# Patient Record
Sex: Female | Born: 1975
Health system: Southern US, Community
[De-identification: ages and names within clinical notes are randomized; demographics above are authoritative.]

## PROBLEM LIST (undated history)

## (undated) DIAGNOSIS — E669 Obesity, unspecified: Secondary | ICD-10-CM

## (undated) DIAGNOSIS — F319 Bipolar disorder, unspecified: Secondary | ICD-10-CM

## (undated) DIAGNOSIS — Z973 Presence of spectacles and contact lenses: Secondary | ICD-10-CM

## (undated) DIAGNOSIS — F329 Major depressive disorder, single episode, unspecified: Secondary | ICD-10-CM

## (undated) DIAGNOSIS — E282 Polycystic ovarian syndrome: Secondary | ICD-10-CM

## (undated) DIAGNOSIS — M25562 Pain in left knee: Secondary | ICD-10-CM

## (undated) DIAGNOSIS — Z9109 Other allergy status, other than to drugs and biological substances: Secondary | ICD-10-CM

## (undated) DIAGNOSIS — M1990 Unspecified osteoarthritis, unspecified site: Secondary | ICD-10-CM

## (undated) DIAGNOSIS — E119 Type 2 diabetes mellitus without complications: Secondary | ICD-10-CM

## (undated) DIAGNOSIS — F32A Depression, unspecified: Secondary | ICD-10-CM

## (undated) DIAGNOSIS — I10 Essential (primary) hypertension: Secondary | ICD-10-CM

## (undated) DIAGNOSIS — F419 Anxiety disorder, unspecified: Secondary | ICD-10-CM

## (undated) HISTORY — PX: ABDOMINAL HYSTERECTOMY: SHX81

## (undated) HISTORY — PX: DILATION AND CURETTAGE OF UTERUS: SHX78

## (undated) HISTORY — DX: Morbid (severe) obesity due to excess calories: E66.01

## (undated) HISTORY — PX: WISDOM TOOTH EXTRACTION: SHX21

---

## 1998-10-10 ENCOUNTER — Emergency Department (HOSPITAL_COMMUNITY): Admission: EM | Admit: 1998-10-10 | Discharge: 1998-10-10 | Payer: Self-pay | Admitting: Emergency Medicine

## 1998-10-27 ENCOUNTER — Emergency Department (HOSPITAL_COMMUNITY): Admission: EM | Admit: 1998-10-27 | Discharge: 1998-10-28 | Payer: Self-pay | Admitting: Emergency Medicine

## 1998-10-28 ENCOUNTER — Encounter: Payer: Self-pay | Admitting: Emergency Medicine

## 2001-11-21 ENCOUNTER — Emergency Department (HOSPITAL_COMMUNITY): Admission: EM | Admit: 2001-11-21 | Discharge: 2001-11-21 | Payer: Self-pay

## 2002-10-11 ENCOUNTER — Emergency Department (HOSPITAL_COMMUNITY): Admission: EM | Admit: 2002-10-11 | Discharge: 2002-10-11 | Payer: Self-pay

## 2002-11-20 ENCOUNTER — Emergency Department (HOSPITAL_COMMUNITY): Admission: EM | Admit: 2002-11-20 | Discharge: 2002-11-20 | Payer: Self-pay | Admitting: Emergency Medicine

## 2004-05-31 ENCOUNTER — Emergency Department (HOSPITAL_COMMUNITY): Admission: EM | Admit: 2004-05-31 | Discharge: 2004-06-01 | Payer: Self-pay | Admitting: Emergency Medicine

## 2004-11-07 ENCOUNTER — Emergency Department (HOSPITAL_COMMUNITY): Admission: EM | Admit: 2004-11-07 | Discharge: 2004-11-07 | Payer: Self-pay | Admitting: Emergency Medicine

## 2005-10-10 ENCOUNTER — Emergency Department (HOSPITAL_COMMUNITY): Admission: EM | Admit: 2005-10-10 | Discharge: 2005-10-10 | Payer: Self-pay | Admitting: Emergency Medicine

## 2005-12-19 ENCOUNTER — Inpatient Hospital Stay (HOSPITAL_COMMUNITY): Admission: AD | Admit: 2005-12-19 | Discharge: 2005-12-19 | Payer: Self-pay | Admitting: Obstetrics and Gynecology

## 2006-07-24 ENCOUNTER — Inpatient Hospital Stay (HOSPITAL_COMMUNITY): Admission: AD | Admit: 2006-07-24 | Discharge: 2006-07-25 | Payer: Self-pay | Admitting: Psychiatry

## 2006-07-24 ENCOUNTER — Ambulatory Visit: Payer: Self-pay | Admitting: Psychiatry

## 2006-08-07 ENCOUNTER — Ambulatory Visit (HOSPITAL_COMMUNITY): Payer: Self-pay | Admitting: Psychiatry

## 2006-09-04 ENCOUNTER — Emergency Department (HOSPITAL_COMMUNITY): Admission: EM | Admit: 2006-09-04 | Discharge: 2006-09-04 | Payer: Self-pay | Admitting: Emergency Medicine

## 2006-11-14 ENCOUNTER — Emergency Department (HOSPITAL_COMMUNITY): Admission: EM | Admit: 2006-11-14 | Discharge: 2006-11-14 | Payer: Self-pay | Admitting: Emergency Medicine

## 2006-11-16 ENCOUNTER — Emergency Department (HOSPITAL_COMMUNITY): Admission: EM | Admit: 2006-11-16 | Discharge: 2006-11-17 | Payer: Self-pay | Admitting: Emergency Medicine

## 2007-02-12 ENCOUNTER — Emergency Department (HOSPITAL_COMMUNITY): Admission: EM | Admit: 2007-02-12 | Discharge: 2007-02-13 | Payer: Self-pay | Admitting: Emergency Medicine

## 2007-02-13 ENCOUNTER — Ambulatory Visit (HOSPITAL_COMMUNITY): Admission: RE | Admit: 2007-02-13 | Discharge: 2007-02-13 | Payer: Self-pay | Admitting: Emergency Medicine

## 2007-05-13 ENCOUNTER — Emergency Department (HOSPITAL_COMMUNITY): Admission: EM | Admit: 2007-05-13 | Discharge: 2007-05-13 | Payer: Self-pay | Admitting: Emergency Medicine

## 2007-09-02 ENCOUNTER — Emergency Department (HOSPITAL_COMMUNITY): Admission: EM | Admit: 2007-09-02 | Discharge: 2007-09-02 | Payer: Self-pay | Admitting: Emergency Medicine

## 2007-11-02 ENCOUNTER — Emergency Department (HOSPITAL_COMMUNITY): Admission: EM | Admit: 2007-11-02 | Discharge: 2007-11-02 | Payer: Self-pay | Admitting: Emergency Medicine

## 2008-02-15 ENCOUNTER — Emergency Department (HOSPITAL_COMMUNITY): Admission: EM | Admit: 2008-02-15 | Discharge: 2008-02-15 | Payer: Self-pay | Admitting: Emergency Medicine

## 2008-02-21 ENCOUNTER — Emergency Department (HOSPITAL_COMMUNITY): Admission: EM | Admit: 2008-02-21 | Discharge: 2008-02-21 | Payer: Self-pay | Admitting: Emergency Medicine

## 2008-04-05 ENCOUNTER — Emergency Department (HOSPITAL_COMMUNITY): Admission: EM | Admit: 2008-04-05 | Discharge: 2008-04-06 | Payer: Self-pay | Admitting: Emergency Medicine

## 2008-05-02 ENCOUNTER — Emergency Department (HOSPITAL_COMMUNITY): Admission: EM | Admit: 2008-05-02 | Discharge: 2008-05-02 | Payer: Self-pay | Admitting: Family Medicine

## 2008-07-11 ENCOUNTER — Emergency Department (HOSPITAL_COMMUNITY): Admission: EM | Admit: 2008-07-11 | Discharge: 2008-07-11 | Payer: Self-pay | Admitting: Emergency Medicine

## 2008-08-02 ENCOUNTER — Inpatient Hospital Stay (HOSPITAL_COMMUNITY): Admission: AD | Admit: 2008-08-02 | Discharge: 2008-08-02 | Payer: Self-pay | Admitting: Gynecology

## 2009-03-22 ENCOUNTER — Emergency Department (HOSPITAL_COMMUNITY): Admission: EM | Admit: 2009-03-22 | Discharge: 2009-03-22 | Payer: Self-pay | Admitting: Emergency Medicine

## 2009-08-06 ENCOUNTER — Emergency Department (HOSPITAL_COMMUNITY): Admission: EM | Admit: 2009-08-06 | Discharge: 2009-08-07 | Payer: Self-pay | Admitting: Emergency Medicine

## 2010-02-18 ENCOUNTER — Emergency Department (HOSPITAL_COMMUNITY): Admission: EM | Admit: 2010-02-18 | Discharge: 2010-02-18 | Payer: Self-pay | Admitting: Family Medicine

## 2010-02-21 ENCOUNTER — Ambulatory Visit (HOSPITAL_BASED_OUTPATIENT_CLINIC_OR_DEPARTMENT_OTHER): Admission: RE | Admit: 2010-02-21 | Discharge: 2010-02-21 | Payer: Self-pay | Admitting: Orthopedic Surgery

## 2010-08-12 ENCOUNTER — Emergency Department (HOSPITAL_COMMUNITY): Admission: EM | Admit: 2010-08-12 | Discharge: 2010-08-12 | Payer: Self-pay | Admitting: Emergency Medicine

## 2010-10-12 ENCOUNTER — Emergency Department (HOSPITAL_COMMUNITY): Admission: EM | Admit: 2010-10-12 | Discharge: 2010-02-19 | Payer: Self-pay | Admitting: Emergency Medicine

## 2010-11-29 ENCOUNTER — Emergency Department (HOSPITAL_COMMUNITY)
Admission: EM | Admit: 2010-11-29 | Discharge: 2010-11-29 | Payer: Self-pay | Source: Home / Self Care | Admitting: Emergency Medicine

## 2011-01-02 ENCOUNTER — Emergency Department (HOSPITAL_COMMUNITY)
Admission: EM | Admit: 2011-01-02 | Discharge: 2011-01-02 | Disposition: A | Discharge status: Home / Self Care | Payer: 59 | Attending: Emergency Medicine | Admitting: Emergency Medicine

## 2011-01-02 DIAGNOSIS — G43909 Migraine, unspecified, not intractable, without status migrainosus: Secondary | ICD-10-CM | POA: Insufficient documentation

## 2011-01-02 DIAGNOSIS — Z79899 Other long term (current) drug therapy: Secondary | ICD-10-CM | POA: Insufficient documentation

## 2011-01-18 LAB — URINALYSIS, ROUTINE W REFLEX MICROSCOPIC
Bilirubin Urine: NEGATIVE
Specific Gravity, Urine: <1.005 — ABNORMAL LOW (ref 1.005–1.030)
pH: 5 (ref 5.0–8.0)

## 2011-01-18 LAB — CBC
HCT: 39.8 % (ref 36.0–46.0)
Hemoglobin: 13.2 g/dL (ref 12.0–15.0)
MCH: 29.1 pg (ref 26.0–34.0)
MCHC: 33.2 g/dL (ref 30.0–36.0)
MCV: 87.6 fL (ref 78.0–100.0)
Platelets: 282 10*3/uL (ref 150–400)
RBC: 4.54 MIL/uL (ref 3.87–5.11)
RDW: 17.1 % — ABNORMAL HIGH (ref 11.5–15.5)
WBC: 8.8 10*3/uL (ref 4.0–10.5)

## 2011-01-18 LAB — DIFFERENTIAL
Basophils Absolute: 0 10*3/uL (ref 0.0–0.1)
Basophils Relative: 0 % (ref 0–1)
Eosinophils Absolute: 0.1 10*3/uL (ref 0.0–0.7)
Eosinophils Relative: 1 % (ref 0–5)
Lymphocytes Relative: 34 % (ref 12–46)
Lymphs Abs: 3 10*3/uL (ref 0.7–4.0)
Monocytes Absolute: 0.8 10*3/uL (ref 0.1–1.0)
Monocytes Relative: 9 % (ref 3–12)
Neutro Abs: 4.9 10*3/uL (ref 1.7–7.7)
Neutrophils Relative %: 56 % (ref 43–77)

## 2011-01-18 LAB — COMPREHENSIVE METABOLIC PANEL WITH GFR
ALT: 16 U/L (ref 0–35)
AST: 17 U/L (ref 0–37)
Albumin: 3.8 g/dL (ref 3.5–5.2)
Alkaline Phosphatase: 71 U/L (ref 39–117)
BUN: 8 mg/dL (ref 6–23)
CO2: 26 meq/L (ref 19–32)
Calcium: 8.9 mg/dL (ref 8.4–10.5)
Chloride: 104 meq/L (ref 96–112)
Creatinine, Ser: 0.6 mg/dL (ref 0.4–1.2)
GFR calc non Af Amer: >60 mL/min
Glucose, Bld: 92 mg/dL (ref 70–99)
Potassium: 4 meq/L (ref 3.5–5.1)
Sodium: 137 meq/L (ref 135–145)
Total Bilirubin: 0.2 mg/dL — ABNORMAL LOW (ref 0.3–1.2)
Total Protein: 7 g/dL (ref 6.0–8.3)

## 2011-01-18 LAB — WET PREP, GENITAL
Trich, Wet Prep: NONE SEEN
Yeast Wet Prep HPF POC: NONE SEEN

## 2011-01-18 LAB — GC/CHLAMYDIA PROBE AMP, GENITAL
Chlamydia, DNA Probe: NEGATIVE
GC Probe Amp, Genital: NEGATIVE

## 2011-01-18 LAB — URINE MICROSCOPIC-ADD ON

## 2011-01-23 LAB — KOH PREP: KOH Prep: NONE SEEN

## 2011-01-23 LAB — WOUND CULTURE: Culture: NO GROWTH

## 2011-01-23 LAB — ANAEROBIC CULTURE: Gram Stain: NONE SEEN

## 2011-02-08 LAB — URINALYSIS, ROUTINE W REFLEX MICROSCOPIC
Bilirubin Urine: NEGATIVE
Glucose, UA: NEGATIVE mg/dL
Protein, ur: NEGATIVE mg/dL
Specific Gravity, Urine: 1.028 (ref 1.005–1.030)
Urobilinogen, UA: 0.2 mg/dL (ref 0.0–1.0)

## 2011-02-08 LAB — URINE MICROSCOPIC-ADD ON

## 2011-02-08 LAB — GC/CHLAMYDIA PROBE AMP, GENITAL
Chlamydia, DNA Probe: NEGATIVE
GC Probe Amp, Genital: NEGATIVE

## 2011-02-08 LAB — DIFFERENTIAL
Basophils Absolute: 0 10*3/uL (ref 0.0–0.1)
Basophils Relative: 0 % (ref 0–1)
Eosinophils Absolute: 0.1 10*3/uL (ref 0.0–0.7)
Monocytes Relative: 7 % (ref 3–12)
Neutro Abs: 7.5 10*3/uL (ref 1.7–7.7)
Neutrophils Relative %: 71 % (ref 43–77)

## 2011-02-08 LAB — CBC
MCHC: 33.5 g/dL (ref 30.0–36.0)
MCV: 89.9 fL (ref 78.0–100.0)
Platelets: 249 10*3/uL (ref 150–400)
RBC: 4.63 MIL/uL (ref 3.87–5.11)

## 2011-02-08 LAB — RPR: RPR Ser Ql: NONREACTIVE

## 2011-02-08 LAB — WET PREP, GENITAL: Trich, Wet Prep: NONE SEEN

## 2011-02-13 LAB — URINALYSIS, ROUTINE W REFLEX MICROSCOPIC
Bilirubin Urine: NEGATIVE
Glucose, UA: NEGATIVE mg/dL
Hgb urine dipstick: NEGATIVE
Ketones, ur: NEGATIVE mg/dL
Protein, ur: NEGATIVE mg/dL
Urobilinogen, UA: 0.2 mg/dL (ref 0.0–1.0)

## 2011-02-13 LAB — COMPREHENSIVE METABOLIC PANEL
AST: 18 U/L (ref 0–37)
Albumin: 3.2 g/dL — ABNORMAL LOW (ref 3.5–5.2)
BUN: 7 mg/dL (ref 6–23)
Calcium: 8.6 mg/dL (ref 8.4–10.5)
Chloride: 107 mEq/L (ref 96–112)
Creatinine, Ser: 0.65 mg/dL (ref 0.4–1.2)
GFR calc Af Amer: >60 mL/min (ref >60)
Total Bilirubin: 0.1 mg/dL — ABNORMAL LOW (ref 0.3–1.2)

## 2011-02-13 LAB — CBC
HCT: 39.6 % (ref 36.0–46.0)
MCHC: 34.8 g/dL (ref 30.0–36.0)
MCV: 90.1 fL (ref 78.0–100.0)
Platelets: 239 10*3/uL (ref 150–400)
RDW: 14.2 % (ref 11.5–15.5)
WBC: 9.4 10*3/uL (ref 4.0–10.5)

## 2011-02-13 LAB — DIFFERENTIAL
Basophils Absolute: 0 10*3/uL (ref 0.0–0.1)
Lymphocytes Relative: 22 % (ref 12–46)
Lymphs Abs: 2 10*3/uL (ref 0.7–4.0)
Monocytes Absolute: 0.8 10*3/uL (ref 0.1–1.0)
Neutro Abs: 6.4 10*3/uL (ref 1.7–7.7)

## 2011-05-25 ENCOUNTER — Emergency Department (HOSPITAL_COMMUNITY)
Admission: EM | Admit: 2011-05-25 | Discharge: 2011-05-25 | Disposition: A | Discharge status: Home / Self Care | Payer: 59 | Attending: Emergency Medicine | Admitting: Emergency Medicine

## 2011-05-25 ENCOUNTER — Encounter: Payer: Self-pay | Admitting: *Deleted

## 2011-05-25 DIAGNOSIS — T6391XA Toxic effect of contact with unspecified venomous animal, accidental (unintentional), initial encounter: Secondary | ICD-10-CM | POA: Insufficient documentation

## 2011-05-25 DIAGNOSIS — T63481A Toxic effect of venom of other arthropod, accidental (unintentional), initial encounter: Secondary | ICD-10-CM

## 2011-05-25 DIAGNOSIS — IMO0001 Reserved for inherently not codable concepts without codable children: Secondary | ICD-10-CM | POA: Insufficient documentation

## 2011-05-25 DIAGNOSIS — T63461A Toxic effect of venom of wasps, accidental (unintentional), initial encounter: Secondary | ICD-10-CM | POA: Insufficient documentation

## 2011-05-25 DIAGNOSIS — F17200 Nicotine dependence, unspecified, uncomplicated: Secondary | ICD-10-CM | POA: Insufficient documentation

## 2011-05-25 MED ORDER — DIPHENHYDRAMINE HCL 50 MG/ML IJ SOLN
25.0000 mg | Freq: Once | INTRAMUSCULAR | Status: AC
  Administered 2011-05-25: 25 mg via INTRAVENOUS
  Filled 2011-05-25: qty 1

## 2011-05-25 MED ORDER — EPINEPHRINE 0.3 MG/0.3ML IJ DEVI: 0.3000 mg | Freq: Once | INTRAMUSCULAR | Status: DC

## 2011-05-25 MED ORDER — HYDROCODONE-ACETAMINOPHEN 7.5-500 MG PO TABS: 1.0000 | ORAL_TABLET | Freq: Four times a day (QID) | ORAL | Status: AC | PRN

## 2011-05-25 MED ORDER — PROMETHAZINE HCL 12.5 MG PO TABS
12.5000 mg | ORAL_TABLET | Freq: Once | ORAL | Status: AC
  Administered 2011-05-25: 12.5 mg via ORAL
  Filled 2011-05-25: qty 1

## 2011-05-25 MED ORDER — METHYLPREDNISOLONE SODIUM SUCC 125 MG IJ SOLR
125.0000 mg | Freq: Once | INTRAMUSCULAR | Status: AC
  Administered 2011-05-25: 125 mg via INTRAVENOUS
  Filled 2011-05-25: qty 2

## 2011-05-25 MED ORDER — EPINEPHRINE HCL 1 MG/ML IJ SOLN
INTRAMUSCULAR | Status: AC
  Filled 2011-05-25: qty 1

## 2011-05-25 MED ORDER — SODIUM CHLORIDE 0.9 % IV SOLN
Freq: Once | INTRAVENOUS | Status: AC
  Administered 2011-05-25: 21:00:00 via INTRAVENOUS

## 2011-05-25 MED ORDER — FAMOTIDINE IN NACL 20-0.9 MG/50ML-% IV SOLN
20.0000 mg | Freq: Once | INTRAVENOUS | Status: AC
  Administered 2011-05-25: 22:00:00 via INTRAVENOUS
  Filled 2011-05-25: qty 50

## 2011-05-25 MED ORDER — EPINEPHRINE HCL 0.1 MG/ML IJ SOLN
0.3000 mg | Freq: Once | INTRAMUSCULAR | Status: AC
  Administered 2011-05-25: 0.3 mg via SUBCUTANEOUS
  Filled 2011-05-25: qty 10

## 2011-05-25 MED ORDER — HYDROCODONE-ACETAMINOPHEN 5-325 MG PO TABS
2.0000 | ORAL_TABLET | Freq: Once | ORAL | Status: AC
  Administered 2011-05-25: 2 via ORAL
  Filled 2011-05-25: qty 2

## 2011-05-25 MED ORDER — EPINEPHRINE HCL 0.1 MG/ML IJ SOLN
0.3000 mg | Freq: Once | INTRAMUSCULAR | Status: DC
  Filled 2011-05-25: qty 10

## 2011-05-25 MED ORDER — HYDROXYZINE PAMOATE 100 MG PO CAPS: 100.0000 mg | ORAL_CAPSULE | Freq: Three times a day (TID) | ORAL | Status: AC | PRN

## 2011-05-25 NOTE — Progress Notes (Signed)
The redness of the bite site had significantly improved. The pt states is continues to pain her. Rx for Lortab given. Speech clear. Lungs clear. Pt ambulatory without problem. No hives.

## 2011-05-25 NOTE — Progress Notes (Signed)
Speech clear. Airway patent. No hives. Lungs clear.

## 2011-05-25 NOTE — ED Provider Notes (Signed)
History     Chief Complaint  Patient presents with  . Insect Bite   HPI Comments: Pt stung by a bee on the right bicep area. She reports previous hx of severe reaction to a yellow jacket sting. She feel as if she is having difficulty breathing at times. No swelling of face. No hives. No LOC. She treated this about 20 min after the sting with benadryl at home.   The history is provided by the patient.    History reviewed. No pertinent past medical history.  Past Surgical History  Procedure Date  . Abdominal hysterectomy     History reviewed. No pertinent family history.  History  Substance Use Topics  . Smoking status: Current Some Day Smoker  . Smokeless tobacco: Not on file  . Alcohol Use: No    OB History    Grav Para Term Preterm Abortions TAB SAB Ect Mult Living                  Review of Systems  Constitutional: Negative for activity change.       All ROS Neg except as noted in HPI  HENT: Negative for nosebleeds and neck pain.   Eyes: Negative for photophobia and discharge.  Respiratory: Negative for cough, shortness of breath and wheezing.        Difficulty breathing.  Cardiovascular: Negative for chest pain and palpitations.  Gastrointestinal: Negative for abdominal pain and blood in stool.  Genitourinary: Negative for dysuria, frequency and hematuria.  Musculoskeletal: Positive for myalgias. Negative for back pain and arthralgias.  Skin: Negative.   Neurological: Negative for dizziness, seizures and speech difficulty.  Psychiatric/Behavioral: Negative for hallucinations and confusion.    Physical Exam  BP 115/57  Pulse 94  Temp(Src) 98.1 F (36.7 C) (Oral)  Resp 20  Ht 5\' 6"  (1.676 m)  Wt 345 lb (156.491 kg)  BMI 55.68 kg/m2  SpO2 97%  Physical Exam  Nursing note and vitals reviewed. Constitutional: She is oriented to person, place, and time. She appears well-developed and well-nourished.  Non-toxic appearance.  HENT:  Head: Normocephalic.    Right Ear: Tympanic membrane normal.  Left Ear: Tympanic membrane normal.  Mouth/Throat: Oropharynx is clear and moist.  Eyes: EOM and lids are normal. Pupils are equal, round, and reactive to light.  Neck: Normal range of motion. Neck supple. Carotid bruit is not present. No tracheal deviation present.  Cardiovascular: Normal rate, regular rhythm, normal heart sounds, intact distal pulses and normal pulses.   Pulmonary/Chest: Breath sounds normal. No stridor. No respiratory distress. She has no wheezes. She has no rales.  Abdominal: Soft. Bowel sounds are normal. There is no tenderness. There is no guarding.  Musculoskeletal: Normal range of motion.       Increase redness of the right bicep area. No evidence of stinger at the bite site.  Lymphadenopathy:       Head (right side): No submandibular adenopathy present.       Head (left side): No submandibular adenopathy present.    She has no cervical adenopathy.  Neurological: She is alert and oriented to person, place, and time. She has normal strength. No cranial nerve deficit or sensory deficit.  Skin: Skin is warm and dry. No rash noted.  Psychiatric: She has a normal mood and affect. Her speech is normal.    ED Course  Procedures  MDM I have reviewed nursing notes, vital signs, and all appropriate lab and imaging results for this patient.  Kathie Dike, [place] 05/25/11 [phone]

## 2011-05-25 NOTE — ED Notes (Signed)
Pt c/o bee sting to right arm; pt states she does not have her epi pen with her

## 2011-05-25 NOTE — ED Provider Notes (Addendum)
History     Chief Complaint  Patient presents with  . Insect Bite   HPI  History reviewed. No pertinent past medical history.  Past Surgical History  Procedure Date  . Abdominal hysterectomy     History reviewed. No pertinent family history.  History  Substance Use Topics  . Smoking status: Current Some Day Smoker  . Smokeless tobacco: Not on file  . Alcohol Use: No    OB History    Grav Para Term Preterm Abortions TAB SAB Ect Mult Living                  Review of Systems  Physical Exam  BP 126/79  Pulse 98  Temp(Src) 97.9 F (36.6 C) (Oral)  Resp 22  Ht 5\' 6"  (1.676 m)  Wt 345 lb (156.491 kg)  BMI 55.68 kg/m2  SpO2 97%  Physical Exam  ED Course  Procedures  MDM  Medical screening examination/treatment/procedure(s) were performed by non-physician practitioner and as supervising physician I was immediately available for consultation/collaboration.  Devoria Albe, MD, FACEP      Ward Givens, MD 05/25/11 9147  Ward Givens, MD 05/25/11 8295  Ward Givens, MD 05/25/11 2258

## 2011-05-26 NOTE — Progress Notes (Signed)
Medical screening examination/treatment/procedure(s) were performed by non-physician practitioner and as supervising physician I was immediately available for consultation/collaboration. Taran Haynesworth, MD, FACEP 

## 2011-07-25 ENCOUNTER — Emergency Department (HOSPITAL_COMMUNITY)
Admission: EM | Admit: 2011-07-25 | Discharge: 2011-07-25 | Disposition: A | Discharge status: Home / Self Care | Payer: 59 | Attending: Emergency Medicine | Admitting: Emergency Medicine

## 2011-07-25 ENCOUNTER — Emergency Department (HOSPITAL_COMMUNITY): Payer: 59

## 2011-07-25 ENCOUNTER — Encounter (HOSPITAL_COMMUNITY): Payer: Self-pay

## 2011-07-25 DIAGNOSIS — J40 Bronchitis, not specified as acute or chronic: Secondary | ICD-10-CM | POA: Insufficient documentation

## 2011-07-25 DIAGNOSIS — F17200 Nicotine dependence, unspecified, uncomplicated: Secondary | ICD-10-CM | POA: Insufficient documentation

## 2011-07-25 HISTORY — DX: Polycystic ovarian syndrome: E28.2

## 2011-07-25 MED ORDER — ALBUTEROL SULFATE HFA 108 (90 BASE) MCG/ACT IN AERS
2.0000 | INHALATION_SPRAY | Freq: Once | RESPIRATORY_TRACT | Status: AC
  Administered 2011-07-25: 2 via RESPIRATORY_TRACT
  Filled 2011-07-25: qty 6.7

## 2011-07-25 MED ORDER — HYDROCODONE-HOMATROPINE 5-1.5 MG/5ML PO SYRP: 5.0000 mL | ORAL_SOLUTION | Freq: Four times a day (QID) | ORAL | Status: AC | PRN

## 2011-07-25 MED ORDER — SODIUM CHLORIDE 0.9 % IN NEBU
INHALATION_SOLUTION | RESPIRATORY_TRACT | Status: AC
  Administered 2011-07-25: 3 mL
  Filled 2011-07-25: qty 3

## 2011-07-25 MED ORDER — AZITHROMYCIN 250 MG PO TABS: 250.0000 mg | ORAL_TABLET | Freq: Every day | ORAL | Status: AC

## 2011-07-25 MED ORDER — ALBUTEROL SULFATE (5 MG/ML) 0.5% IN NEBU
2.5000 mg | INHALATION_SOLUTION | Freq: Once | RESPIRATORY_TRACT | Status: AC
  Administered 2011-07-25: 2.5 mg via RESPIRATORY_TRACT
  Filled 2011-07-25: qty 0.5

## 2011-07-25 NOTE — ED Provider Notes (Signed)
History     CSN: [id number] Arrival date & time: 07/25/2011  2:30 PM   Chief Complaint  Patient presents with  . Cough  . Nasal Congestion  . Emesis  . Shortness of Breath     (Include location/radiation/quality/duration/timing/severity/associated sxs/prior treatment) Patient is a 35 y.o. female presenting with cough, vomiting, and shortness of breath. The history is provided by the patient.  Cough This is a new problem. The current episode started more than 2 days ago. The problem occurs constantly. The problem has been rapidly worsening. The cough is non-productive. There has been no fever. Pertinent negatives include no chest pain and no chills.  Emesis  Associated symptoms include cough. Pertinent negatives include no chills.  Shortness of Breath  Associated symptoms include cough. Pertinent negatives include no chest pain.     Past Medical History  Diagnosis Date  . Endometriosis   . Polycystic ovarian disease      Past Surgical History  Procedure Date  . Abdominal hysterectomy     No family history on file.  History  Substance Use Topics  . Smoking status: Current Some Day Smoker  . Smokeless tobacco: Not on file  . Alcohol Use: No    OB History    Grav Para Term Preterm Abortions TAB SAB Ect Mult Living                  Review of Systems  Constitutional: Negative for chills.  HENT: Negative for neck pain and neck stiffness.   Respiratory: Positive for cough.   Cardiovascular: Negative for chest pain.  Gastrointestinal: Positive for vomiting.  All other systems reviewed and are negative.    Allergies  Codeine  Home Medications   Current Outpatient Rx  Name Route Sig Dispense Refill  . EPINEPHRINE 0.3 MG/0.3ML IJ DEVI Intramuscular Inject 0.3 mLs (0.3 mg total) into the muscle once. 1 Device 0  . LAMOTRIGINE 100 MG PO TABS Oral Take 100 mg by mouth 2 (two) times daily.        Physical Exam    BP 124/89  Pulse 113  Temp(Src) 98.6 F (37  C) (Oral)  Resp 21  SpO2 97%  Physical Exam  Constitutional: She is oriented to person, place, and time. She appears well-developed and well-nourished. No distress.  HENT:  Head: Normocephalic and atraumatic.  Right Ear: External ear normal.  Left Ear: External ear normal.  Mouth/Throat: Oropharynx is clear and moist.  Neck: Normal range of motion. Neck supple.  Cardiovascular: Normal rate and regular rhythm.  Exam reveals no gallop and no friction rub.   No murmur heard. Pulmonary/Chest: Effort normal. No respiratory distress.       Having persistent cough.  Expiratory wheezing to auscultation.  Musculoskeletal: Normal range of motion.  Lymphadenopathy:    She has no cervical adenopathy.  Neurological: She is alert and oriented to person, place, and time.  Skin: Skin is warm and dry. She is not diaphoretic.    ED Course  Procedures  Results for orders placed during the hospital encounter of 08/12/10  CBC      Component Value Range   WBC 8.8  4.0 - 10.5 (K/uL)   RBC 4.54  3.87 - 5.11 (MIL/uL)   Hemoglobin 13.2  12.0 - 15.0 (g/dL)   HCT 13.0  86.5 - 78.4 (%)   MCV 87.6  78.0 - 100.0 (fL)   MCH 29.1  26.0 - 34.0 (pg)   MCHC 33.2  30.0 - 36.0 (g/dL)  RDW 17.1 (*) 11.5 - 15.5 (%)   Platelets 282  150 - 400 (K/uL)  COMPREHENSIVE METABOLIC PANEL      Component Value Range   Sodium 137  135 - 145 (mEq/L)   Potassium 4.0  3.5 - 5.1 (mEq/L)   Chloride 104  96 - 112 (mEq/L)   CO2 26  19 - 32 (mEq/L)   Glucose, Bld 92  70 - 99 (mg/dL)   BUN 8  6 - 23 (mg/dL)   Creatinine, Ser 1.61  0.4 - 1.2 (mg/dL)   Calcium 8.9  8.4 - 09.6 (mg/dL)   Total Protein 7.0  6.0 - 8.3 (g/dL)   Albumin 3.8  3.5 - 5.2 (g/dL)   AST 17  0 - 37 (U/L)   ALT 16  0 - 35 (U/L)   Alkaline Phosphatase 71  39 - 117 (U/L)   Total Bilirubin 0.2 (*) 0.3 - 1.2 (mg/dL)   GFR calc non Af Amer >60  >60 (mL/min)   GFR calc Af Amer    >60 (mL/min)   Value: >60            The eGFR has been calculated     using  the MDRD equation.     This calculation has not been     validated in all clinical     situations.     eGFR's persistently     <60 mL/min signify     possible Chronic Kidney Disease.  DIFFERENTIAL      Component Value Range   Neutrophils Relative 56  43 - 77 (%)   Neutro Abs 4.9  1.7 - 7.7 (K/uL)   Lymphocytes Relative 34  12 - 46 (%)   Lymphs Abs 3.0  0.7 - 4.0 (K/uL)   Monocytes Relative 9  3 - 12 (%)   Monocytes Absolute 0.8  0.1 - 1.0 (K/uL)   Eosinophils Relative 1  0 - 5 (%)   Eosinophils Absolute 0.1  0.0 - 0.7 (K/uL)   Basophils Relative 0  0 - 1 (%)   Basophils Absolute 0.0  0.0 - 0.1 (K/uL)  GC/CHLAMYDIA PROBE AMP, GENITAL      Component Value Range   GC Probe Amp, Genital    NEGATIVE    Value: NEGATIVE     (NOTE)  Testing performed using the BD ProbeTec Qx Chlamydia trachomatis and Neisseria gonorrhea amplified DNA assay.  Performed at:  [facility]               [address]                    [place], [place] [address]               [id number]   Chlamydia, DNA Probe    NEGATIVE    Value: NEGATIVE     (NOTE)  Testing performed using the BD ProbeT[employer] Qx Chlamydia trachomatis and Neisseria gonorrhea amplified DNA assay.  Performed at:  [facility]               [address]                    [place], [place] [address]  [id number]  WET PREP, GENITAL      Component Value Range   Yeast, Wet Prep NONE SEEN  NONE SEEN    Trich, Wet Prep NONE SEEN  NONE SEEN    Clue Cells, Wet Prep FEW (*) NONE SEEN    WBC, Wet Prep HPF POC FEW (*) NONE SEEN   URINALYSIS, ROUTINE W REFLEX MICROSCOPIC      Component Value Range   Color, Urine YELLOW  YELLOW    Appearance CLEAR  CLEAR    Specific Gravity, Urine <1.005 (*) 1.005 - 1.030    pH 5.0  5.0 - 8.0    Glucose, UA NEGATIVE  NEGATIVE (mg/dL)   Hgb urine dipstick MODERATE (*) NEGATIVE    Bilirubin Urine  NEGATIVE  NEGATIVE    Ketones, ur NEGATIVE  NEGATIVE (mg/dL)   Protein, ur NEGATIVE  NEGATIVE (mg/dL)   Urobilinogen, UA 0.2  0.0 - 1.0 (mg/dL)   Nitrite NEGATIVE  NEGATIVE    Leukocytes, UA NEGATIVE  NEGATIVE   URINE MICROSCOPIC-ADD ON      Component Value Range   Squamous Epithelial / LPF FEW (*) RARE    WBC, UA 3-6  <3 (WBC/hpf)   RBC / HPF 3-6  <3 (RBC/hpf)   Bacteria, UA RARE  RARE    No results found.   No diagnosis found.   MDM Significant improvement with neb treatment.         Geoffery Lyons, MD 07/25/11 [phone]

## 2011-07-25 NOTE — ED Notes (Signed)
Cough,sob, nasal congestion, vomiting for 3 days. Progressively getting worse.

## 2011-07-28 ENCOUNTER — Encounter (HOSPITAL_COMMUNITY): Payer: Self-pay

## 2011-07-28 ENCOUNTER — Emergency Department (HOSPITAL_COMMUNITY)
Admission: EM | Admit: 2011-07-28 | Discharge: 2011-07-28 | Disposition: A | Discharge status: Home / Self Care | Payer: 59 | Attending: Emergency Medicine | Admitting: Emergency Medicine

## 2011-07-28 ENCOUNTER — Emergency Department (HOSPITAL_COMMUNITY): Payer: 59

## 2011-07-28 DIAGNOSIS — L089 Local infection of the skin and subcutaneous tissue, unspecified: Secondary | ICD-10-CM

## 2011-07-28 DIAGNOSIS — L988 Other specified disorders of the skin and subcutaneous tissue: Secondary | ICD-10-CM | POA: Insufficient documentation

## 2011-07-28 DIAGNOSIS — J40 Bronchitis, not specified as acute or chronic: Secondary | ICD-10-CM

## 2011-07-28 DIAGNOSIS — F17200 Nicotine dependence, unspecified, uncomplicated: Secondary | ICD-10-CM | POA: Insufficient documentation

## 2011-07-28 DIAGNOSIS — J9801 Acute bronchospasm: Secondary | ICD-10-CM | POA: Insufficient documentation

## 2011-07-28 MED ORDER — IPRATROPIUM BROMIDE 0.02 % IN SOLN
0.5000 mg | Freq: Once | RESPIRATORY_TRACT | Status: AC
  Administered 2011-07-28: 0.5 mg via RESPIRATORY_TRACT
  Filled 2011-07-28: qty 2.5

## 2011-07-28 MED ORDER — ALBUTEROL SULFATE (5 MG/ML) 0.5% IN NEBU
2.5000 mg | INHALATION_SOLUTION | Freq: Once | RESPIRATORY_TRACT | Status: AC
  Administered 2011-07-28: 2.5 mg via RESPIRATORY_TRACT
  Filled 2011-07-28: qty 0.5

## 2011-07-28 MED ORDER — ALBUTEROL SULFATE HFA 108 (90 BASE) MCG/ACT IN AERS: 1.0000 | INHALATION_SPRAY | Freq: Four times a day (QID) | RESPIRATORY_TRACT | Status: DC | PRN

## 2011-07-28 MED ORDER — ONDANSETRON HCL 4 MG/2ML IJ SOLN
4.0000 mg | Freq: Once | INTRAMUSCULAR | Status: AC
  Administered 2011-07-28: 4 mg via INTRAVENOUS
  Filled 2011-07-28: qty 2

## 2011-07-28 MED ORDER — ALBUTEROL SULFATE (5 MG/ML) 0.5% IN NEBU
5.0000 mg | INHALATION_SOLUTION | Freq: Once | RESPIRATORY_TRACT | Status: AC
  Administered 2011-07-28: 5 mg via RESPIRATORY_TRACT
  Filled 2011-07-28: qty 1

## 2011-07-28 MED ORDER — VANCOMYCIN HCL IN DEXTROSE 1-5 GM/200ML-% IV SOLN
1000.0000 mg | Freq: Once | INTRAVENOUS | Status: DC
  Filled 2011-07-28: qty 200

## 2011-07-28 MED ORDER — PREDNISONE 10 MG PO TABS: 20.0000 mg | ORAL_TABLET | Freq: Two times a day (BID) | ORAL | Status: AC

## 2011-07-28 MED ORDER — DEXTROSE 5 % IV SOLN: 1.0000 g | Freq: Once | INTRAVENOUS | Status: DC

## 2011-07-28 MED ORDER — METHYLPREDNISOLONE SODIUM SUCC 125 MG IJ SOLR
125.0000 mg | Freq: Once | INTRAMUSCULAR | Status: AC
  Administered 2011-07-28: 125 mg via INTRAVENOUS
  Filled 2011-07-28: qty 2

## 2011-07-28 MED ORDER — DOXYCYCLINE HYCLATE 100 MG PO CAPS: 100.0000 mg | ORAL_CAPSULE | Freq: Two times a day (BID) | ORAL | Status: AC

## 2011-07-28 NOTE — ED Notes (Signed)
Seen here in the e.d. 4 days ago, started z-pack for bronchitis, now here with increased sob, cough, sores on arms, chest pain with breathing.

## 2011-07-28 NOTE — ED Provider Notes (Signed)
History    Scribed for Amy Lennert, MD, the patient was seen in room APA01/APA01. This chart was scribed by Amy Reese. This patient's care was started at 9:42 PM.     CSN: [id number] Arrival date & time: 07/28/2011  8:33 PM  Chief Complaint  Patient presents with  . Shortness of Breath    HPI  (Consider location/radiation/quality/duration/timing/severity/associated sxs/prior treatment)  Patient is a 35 y.o. female presenting with shortness of breath.  Shortness of Breath  Associated symptoms include chest pain (increased with inspiration), cough and shortness of breath.    Amy Reese is a 35 y.o. female who presents to the Emergency Department complaining of gradual worsening of SOB with associated cough, chest pain with inspiration and persistent productive cough.  Patient seen in ED 4 days ago for similar sx and was told she had Brochitis.  Patient is a smoker but states she has not smoked since diagnoses.  Patient was given an inhaler, cough syrup and Zithromax.  Patient feels that her recent tattoo may be infected.  Pt c/o sudden onset of sores on her left arm.       PAST MEDICAL HISTORY:  Past Medical History  Diagnosis Date  . Endometriosis   . Polycystic ovarian disease     PAST SURGICAL HISTORY:  Past Surgical History  Procedure Date  . Abdominal hysterectomy     FAMILY HISTORY:  No family history on file.   SOCIAL HISTORY: History   Social History  . Marital Status: Married    Spouse Name: N/A    Number of Children: N/A  . Years of Education: N/A   Social History Main Topics  . Smoking status: Current Some Day Smoker  . Smokeless tobacco: None  . Alcohol Use: No  . Drug Use:   . Sexually Active:    Other Topics Concern  . None   Social History Narrative  . None     Review of Systems  Review of Systems  Constitutional: Negative for fatigue.  HENT: Negative for neck pain, sinus pressure and ear discharge.   Eyes: Negative for  discharge.  Respiratory: Positive for cough and shortness of breath.   Cardiovascular: Positive for chest pain (increased with inspiration).  Gastrointestinal: Negative for abdominal pain and diarrhea.  Genitourinary: Negative for frequency and hematuria.  Musculoskeletal: Negative for back pain.  Skin: Positive for rash (left arm).  Neurological: Negative for seizures and headaches.  Hematological: Negative.   Psychiatric/Behavioral: Negative for hallucinations.    Allergies  Review of patient's allergies indicates no known allergies.  Home Medications   Current Outpatient Rx  Name Route Sig Dispense Refill  . AZITHROMYCIN 250 MG PO TABS Oral Take 1 tablet (250 mg total) by mouth daily. Take first 2 tablets together, then 1 every day until finished. 6 tablet 0  . HYDROCODONE-HOMATROPINE 5-1.5 MG/5ML PO SYRP Oral Take 5 mLs by mouth every 6 (six) hours as needed for cough. 120 mL 0  . LAMOTRIGINE 100 MG PO TABS Oral Take 100 mg by mouth 2 (two) times daily.      Marland Kitchen EPINEPHRINE 0.3 MG/0.3ML IJ DEVI Intramuscular Inject 0.3 mLs (0.3 mg total) into the muscle once. 1 Device 0    Physical Exam    BP 134/77  Pulse 103  Temp(Src) 98.9 F (37.2 C) (Oral)  Resp 20  Ht 5\' 6"  (1.676 m)  Wt 340 lb (154.223 kg)  BMI 54.88 kg/m2  SpO2 97%  Physical Exam  Constitutional: She is oriented  to person, place, and time. She appears well-developed.       Patient is obese.    HENT:  Head: Normocephalic and atraumatic.  Eyes: Conjunctivae and EOM are normal. No scleral icterus.  Neck: Neck supple. No thyromegaly present.  Cardiovascular: Normal rate and regular rhythm.  Exam reveals no gallop and no friction rub.   No murmur heard. Pulmonary/Chest: Effort normal. No stridor. She has wheezes. She exhibits no tenderness.       Mild to moderate wheezes in right and left lung   Abdominal: She exhibits no distension. There is no tenderness. There is no rebound.  Musculoskeletal: Normal range of  motion. She exhibits no edema.  Lymphadenopathy:    She has no cervical adenopathy.  Neurological: She is alert and oriented to person, place, and time. Coordination normal.  Skin: Rash noted. No erythema.       4-5 macro papule areas on left arm consistent with skin infection   Psychiatric: She has a normal mood and affect. Her behavior is normal.    ED Course  Procedures (including critical care time)  OTHER DATA REVIEWED: Nursing notes, vital signs, and past medical records reviewed.   DIAGNOSTIC STUDIES: Oxygen Saturation is 95% on room air, normal by my interpretation.       LABS / RADIOLOGY:   Dg Chest 2 View  07/25/2011  *RADIOLOGY REPORT*  Clinical Data: Cough, shortness of breath, nasal congestion, vomiting, progressively worse  CHEST - 2 VIEW  Comparison: None  Findings: Borderline enlargement of cardiac silhouette. Mediastinal contours and pulmonary vascularity normal. Peribronchial thickening without definite infiltrate or effusion. Slight crowding of basilar markings and mild basilar hypoinflation. No pneumothorax. No acute osseous findings.  IMPRESSION: Bronchitic changes with decreased lung volumes.  Original Report Authenticated By: Amy Reese, M.D.    No results found.   ED COURSE / COORDINATION OF CARE: 9:59 PM  Physical exam complete.  Will give patient nebulizer treatment and order CXR.    Orders Placed This Encounter  Procedures  . DG Chest 2 View  . Saline lock IV    Pt improved with tx   IMPRESSION: Diagnoses that have been ruled out:  Diagnoses that are still under consideration:  Final diagnoses:     MEDICATIONS GIVEN IN THE E.D. Scheduled Meds:    . albuterol  2.5 mg Nebulization Once  . albuterol  5 mg Nebulization Once  . ipratropium  0.5 mg Nebulization Once  . ipratropium  0.5 mg Nebulization Once  . methylPREDNISolone sodium succinate  125 mg Intravenous Once  . DISCONTD: cefTRIAXone (ROCEPHIN) 1 GM IVPB  1 g Intravenous  Once   Continuous Infusions:    . vancomycin 1,000 mg (07/28/11 2223)      DISCHARGE MEDICATIONS: New Prescriptions   No medications on file    The chart was scribed for me under my direct supervision.  I personally performed the history, physical, and medical decision making and all procedures in the evaluation of this patient.Amy Lennert, MD 07/28/11 [phone]

## 2011-07-28 NOTE — ED Notes (Signed)
Pt states was here 4 days ago, given antibiotic, and an inhaler.  Presents today with worsening symptoms of SOB, cough, chest discomfort when coughing and increasing yellow sputum.  Pt also reports sores on left arm, pt believes these are from a tattoo that had gotten infected.

## 2011-08-02 LAB — POCT I-STAT, CHEM 8
BUN: 15
Calcium, Ion: 1.2
Creatinine, Ser: 0.8
HCT: 40
Hemoglobin: 13.6
Potassium: 4.2
Sodium: 139
TCO2: 28

## 2011-08-02 LAB — WET PREP, GENITAL: Trich, Wet Prep: NONE SEEN

## 2011-08-02 LAB — DIFFERENTIAL
Eosinophils Absolute: 0.1
Eosinophils Relative: 1
Lymphocytes Relative: 23
Lymphs Abs: 2.1
Monocytes Relative: 7
Neutrophils Relative %: 70

## 2011-08-02 LAB — URINALYSIS, ROUTINE W REFLEX MICROSCOPIC
Ketones, ur: NEGATIVE
Leukocytes, UA: NEGATIVE
Nitrite: NEGATIVE
Protein, ur: NEGATIVE
Urobilinogen, UA: 0.2
pH: 6

## 2011-08-02 LAB — GC/CHLAMYDIA PROBE AMP, GENITAL
Chlamydia, DNA Probe: NEGATIVE
GC Probe Amp, Genital: NEGATIVE

## 2011-08-02 LAB — CBC
HCT: 38.6
MCV: 89.2
Platelets: 258
RBC: 4.33
WBC: 9.3

## 2011-08-02 LAB — POCT PREGNANCY, URINE: Operator id: 270651

## 2011-08-06 LAB — WET PREP, GENITAL
Trich, Wet Prep: NONE SEEN
Yeast Wet Prep HPF POC: NONE SEEN

## 2011-08-06 LAB — URINALYSIS, ROUTINE W REFLEX MICROSCOPIC
Bilirubin Urine: NEGATIVE
Ketones, ur: NEGATIVE
Nitrite: NEGATIVE
Urobilinogen, UA: 0.2

## 2011-08-06 LAB — POCT PREGNANCY, URINE: Preg Test, Ur: NEGATIVE

## 2011-08-06 LAB — GC/CHLAMYDIA PROBE AMP, GENITAL: GC Probe Amp, Genital: NEGATIVE

## 2011-08-21 LAB — URINALYSIS, ROUTINE W REFLEX MICROSCOPIC
Glucose, UA: NEGATIVE
Protein, ur: NEGATIVE
Specific Gravity, Urine: 1.024
Urobilinogen, UA: 0.2

## 2011-08-21 LAB — DIFFERENTIAL
Lymphocytes Relative: 20
Lymphs Abs: 2.1
Neutro Abs: 7.8 — ABNORMAL HIGH
Neutrophils Relative %: 72

## 2011-08-21 LAB — BASIC METABOLIC PANEL
BUN: 6
Creatinine, Ser: 0.71
GFR calc Af Amer: >60
GFR calc non Af Amer: >60

## 2011-08-21 LAB — CBC
Platelets: 271
WBC: 10.8 — ABNORMAL HIGH

## 2011-08-21 LAB — PREGNANCY, URINE: Preg Test, Ur: NEGATIVE

## 2011-10-20 ENCOUNTER — Encounter (HOSPITAL_COMMUNITY): Payer: Self-pay | Admitting: *Deleted

## 2011-10-20 ENCOUNTER — Emergency Department (HOSPITAL_COMMUNITY)
Admission: EM | Admit: 2011-10-20 | Discharge: 2011-10-20 | Disposition: A | Discharge status: Home / Self Care | Payer: 59 | Attending: Emergency Medicine | Admitting: Emergency Medicine

## 2011-10-20 DIAGNOSIS — L0291 Cutaneous abscess, unspecified: Secondary | ICD-10-CM | POA: Insufficient documentation

## 2011-10-20 DIAGNOSIS — L0390 Cellulitis, unspecified: Secondary | ICD-10-CM | POA: Insufficient documentation

## 2011-10-20 MED ORDER — HYDROCODONE-ACETAMINOPHEN 5-325 MG PO TABS: 1.0000 | ORAL_TABLET | ORAL | Status: AC | PRN

## 2011-10-20 MED ORDER — DOXYCYCLINE HYCLATE 100 MG PO CAPS: 100.0000 mg | ORAL_CAPSULE | Freq: Two times a day (BID) | ORAL | Status: AC

## 2011-10-20 MED ORDER — HYDROCODONE-ACETAMINOPHEN 5-325 MG PO TABS
1.0000 | ORAL_TABLET | ORAL | Status: AC | PRN
Start: 2011-10-20 — End: 2011-10-30

## 2011-10-20 MED ORDER — HYDROCODONE-ACETAMINOPHEN 5-325 MG PO TABS: 1.0000 | ORAL_TABLET | ORAL | Status: DC | PRN

## 2011-10-20 NOTE — ED Notes (Signed)
Small red abscess under rt ear, resulting in pain in neck for about a week

## 2011-10-20 NOTE — ED Provider Notes (Signed)
History     CSN: [id number] Arrival date & time: 10/20/2011  3:05 PM   First MD Initiated Contact with Patient 10/20/11 1510      Chief Complaint  Patient presents with  . Abscess    (Consider location/radiation/quality/duration/timing/severity/associated sxs/prior treatment) HPI Comments: Patient reports that she developed an abscess approximately one week ago.  She popped it herself a couple of days ago and had some drainage from the area.  She reports that she has never had an abscess before.  Patient is a 35 y.o. female presenting with abscess. The history is provided by the patient.  Abscess  This is a new problem. Episode onset: approximately one week ago. The onset was gradual. The problem has been gradually worsening. The abscess is characterized by redness, painfulness, draining and swelling. Pertinent negatives include no anorexia, no fever, no diarrhea, no vomiting, no rhinorrhea, no sore throat and no cough. She has received no recent medical care.    Past Medical History  Diagnosis Date  . Endometriosis   . Polycystic ovarian disease     Past Surgical History  Procedure Date  . Abdominal hysterectomy     No family history on file.  History  Substance Use Topics  . Smoking status: Current Some Day Smoker  . Smokeless tobacco: Not on file  . Alcohol Use: No    OB History    Grav Para Term Preterm Abortions TAB SAB Ect Mult Living                  Review of Systems  Constitutional: Negative for fever and chills.  HENT: Negative for ear pain, sore throat, rhinorrhea, neck pain, neck stiffness and ear discharge.   Eyes: Negative for visual disturbance.  Respiratory: Negative for cough and chest tightness.   Cardiovascular: Negative for chest pain.  Gastrointestinal: Negative for vomiting, abdominal pain, diarrhea and anorexia.  Skin: Positive for color change.  Neurological: Negative for dizziness, syncope, light-headedness and numbness.    Hematological: Negative for adenopathy.  Psychiatric/Behavioral: Negative for confusion.    Allergies  Review of patient's allergies indicates no known allergies.  Home Medications   Current Outpatient Rx  Name Route Sig Dispense Refill  . EPINEPHRINE 0.3 MG/0.3ML IJ DEVI Intramuscular Inject 0.3 mLs (0.3 mg total) into the muscle once. 1 Device 0  . LAMOTRIGINE 100 MG PO TABS Oral Take 100 mg by mouth 2 (two) times daily.      Marland Kitchen RISPERIDONE 1 MG PO TABS Oral Take 1 mg by mouth at bedtime.      . ALBUTEROL SULFATE HFA 108 (90 BASE) MCG/ACT IN AERS Inhalation Inhale 1-2 puffs into the lungs every 6 (six) hours as needed for wheezing. 1 Inhaler 0  . CLONAZEPAM 0.5 MG PO TABS Oral Take 0.5 mg by mouth 2 (two) times daily as needed. anxiety       BP 132/79  Pulse 100  Temp(Src) 98.4 F (36.9 C) (Oral)  Resp 18  SpO2 98%  Physical Exam  Nursing note and vitals reviewed. Constitutional: She is oriented to person, place, and time. She appears well-developed and well-nourished. No distress.  HENT:  Head: Normocephalic and atraumatic.  Right Ear: Tympanic membrane, external ear and ear canal normal. No drainage.  Left Ear: Tympanic membrane, external ear and ear canal normal. No drainage.  Eyes: EOM are normal.  Neck: Normal range of motion. Neck supple.  Cardiovascular: Normal rate, regular rhythm and normal heart sounds.   Pulmonary/Chest: Effort normal and breath  sounds normal. No respiratory distress.  Lymphadenopathy:    She has no cervical adenopathy.  Neurological: She is alert and oriented to person, place, and time.  Skin: She is not diaphoretic.       Approximately 1.5-2cm erythematous indurated area posterior to the right ear lobe.  Area tender to palpation.  Area comes to a point and is open.  No current drainage.  Area is indurated but is not fluctuant.    Psychiatric: She has a normal mood and affect.    ED Course  Procedures (including critical care time)  Labs  Reviewed - No data to display No results found.   1. Abscess       MDM  No fluctuance.  Area was already drained by patient prior to coming to ED.  Therefore, do not feel that an I&D is indicated at this time.  Will discharge patient home on antibiotic and recommend warm compresses and PCP follow up.        Pascal Lux [facility] 10/21/11 1610

## 2011-10-21 NOTE — ED Provider Notes (Signed)
Medical screening examination/treatment/procedure(s) were performed by non-physician practitioner and as supervising physician I was immediately available for consultation/collaboration. Lainey Nelson, MD, FACEP   Amariah Kierstead L Taina Landry, MD 10/21/11 1159 

## 2011-11-09 ENCOUNTER — Emergency Department (HOSPITAL_COMMUNITY)
Admission: EM | Admit: 2011-11-09 | Discharge: 2011-11-09 | Disposition: A | Discharge status: Home / Self Care | Payer: 59 | Attending: Emergency Medicine | Admitting: Emergency Medicine

## 2011-11-09 ENCOUNTER — Encounter (HOSPITAL_COMMUNITY): Payer: Self-pay | Admitting: Emergency Medicine

## 2011-11-09 ENCOUNTER — Emergency Department (HOSPITAL_COMMUNITY): Payer: 59

## 2011-11-09 DIAGNOSIS — W108XXA Fall (on) (from) other stairs and steps, initial encounter: Secondary | ICD-10-CM | POA: Insufficient documentation

## 2011-11-09 DIAGNOSIS — S5011XA Contusion of right forearm, initial encounter: Secondary | ICD-10-CM

## 2011-11-09 DIAGNOSIS — M545 Low back pain, unspecified: Secondary | ICD-10-CM | POA: Insufficient documentation

## 2011-11-09 DIAGNOSIS — S59919A Unspecified injury of unspecified forearm, initial encounter: Secondary | ICD-10-CM | POA: Insufficient documentation

## 2011-11-09 DIAGNOSIS — S20229A Contusion of unspecified back wall of thorax, initial encounter: Secondary | ICD-10-CM | POA: Insufficient documentation

## 2011-11-09 DIAGNOSIS — M79609 Pain in unspecified limb: Secondary | ICD-10-CM | POA: Insufficient documentation

## 2011-11-09 DIAGNOSIS — S300XXA Contusion of lower back and pelvis, initial encounter: Secondary | ICD-10-CM

## 2011-11-09 DIAGNOSIS — Z79899 Other long term (current) drug therapy: Secondary | ICD-10-CM | POA: Insufficient documentation

## 2011-11-09 DIAGNOSIS — S5010XA Contusion of unspecified forearm, initial encounter: Secondary | ICD-10-CM | POA: Insufficient documentation

## 2011-11-09 DIAGNOSIS — S59909A Unspecified injury of unspecified elbow, initial encounter: Secondary | ICD-10-CM | POA: Insufficient documentation

## 2011-11-09 DIAGNOSIS — S6990XA Unspecified injury of unspecified wrist, hand and finger(s), initial encounter: Secondary | ICD-10-CM | POA: Insufficient documentation

## 2011-11-09 DIAGNOSIS — IMO0001 Reserved for inherently not codable concepts without codable children: Secondary | ICD-10-CM | POA: Insufficient documentation

## 2011-11-09 DIAGNOSIS — F17200 Nicotine dependence, unspecified, uncomplicated: Secondary | ICD-10-CM | POA: Insufficient documentation

## 2011-11-09 MED ORDER — HYDROCODONE-ACETAMINOPHEN 5-325 MG PO TABS
1.0000 | ORAL_TABLET | Freq: Once | ORAL | Status: AC
  Administered 2011-11-09: 1 via ORAL
  Filled 2011-11-09: qty 1

## 2011-11-09 MED ORDER — HYDROCODONE-ACETAMINOPHEN 5-325 MG PO TABS: 1.0000 | ORAL_TABLET | Freq: Four times a day (QID) | ORAL | Status: AC | PRN

## 2011-11-09 MED ORDER — IBUPROFEN 800 MG PO TABS
800.0000 mg | ORAL_TABLET | Freq: Once | ORAL | Status: AC
  Administered 2011-11-09: 800 mg via ORAL
  Filled 2011-11-09: qty 1

## 2011-11-09 NOTE — ED Provider Notes (Signed)
History     CSN: [id number]  Arrival date & time 11/09/11  1954   First MD Initiated Contact with Patient 11/09/11 2029      Chief Complaint  Patient presents with  . Fall  . Back Pain  . Arm Pain    (Consider location/radiation/quality/duration/timing/severity/associated sxs/prior treatment) HPI Comments: Pt was walking downstairs and slipped striking her lower back and R forearm on the steps.  No head or other trauma.  Patient is a 36 y.o. female presenting with fall, back pain, and arm pain. The history is provided by the patient. No language interpreter was used.  Fall Incident onset: this AM. The fall occurred while standing. She was not ambulatory at the scene. There was no entrapment after the fall. There was no drug use involved in the accident. There was no alcohol use involved in the accident.  Back Pain   Arm Pain    Past Medical History  Diagnosis Date  . Endometriosis   . Polycystic ovarian disease     Past Surgical History  Procedure Date  . Abdominal hysterectomy     No family history on file.  History  Substance Use Topics  . Smoking status: Current Everyday Smoker -- 1.0 packs/day    Types: Cigarettes  . Smokeless tobacco: Not on file  . Alcohol Use: No    OB History    Grav Para Term Preterm Abortions TAB SAB Ect Mult Living                  Review of Systems  Musculoskeletal: Positive for back pain.       Forearm injury  All other systems reviewed and are negative.    Allergies  Review of patient's allergies indicates no known allergies.  Home Medications   Current Outpatient Rx  Name Route Sig Dispense Refill  . ALBUTEROL SULFATE HFA 108 (90 BASE) MCG/ACT IN AERS Inhalation Inhale 1-2 puffs into the lungs every 6 (six) hours as needed for wheezing. 1 Inhaler 0  . CLONAZEPAM 0.5 MG PO TABS Oral Take 0.5 mg by mouth 2 (two) times daily as needed. anxiety     . EPINEPHRINE 0.3 MG/0.3ML IJ DEVI Intramuscular Inject 0.3 mLs (0.3 mg  total) into the muscle once. 1 Device 0  . LAMOTRIGINE 100 MG PO TABS Oral Take 100 mg by mouth 2 (two) times daily.      Marland Kitchen RISPERIDONE 1 MG PO TABS Oral Take 1 mg by mouth at bedtime.        BP 134/90  Pulse 96  Temp(Src) 98.4 F (36.9 C) (Oral)  Resp 20  Ht 5\' 6"  (1.676 m)  Wt 380 lb (172.367 kg)  BMI 61.33 kg/m2  SpO2 97%  Physical Exam  Nursing note and vitals reviewed. Constitutional: She is oriented to person, place, and time. She appears well-developed and well-nourished. No distress.  HENT:  Head: Normocephalic and atraumatic.  Right Ear: External ear normal.  Left Ear: External ear normal.  Nose: Nose normal.  Eyes: EOM are normal. Pupils are equal, round, and reactive to light.  Neck: Normal range of motion. Neck supple.  Cardiovascular: Normal rate, regular rhythm and normal heart sounds.   Pulmonary/Chest: Effort normal and breath sounds normal. No respiratory distress. She has no wheezes. She has no rales. She exhibits no tenderness.  Abdominal: Soft. She exhibits no distension. There is no tenderness.  Musculoskeletal: She exhibits tenderness.       Right shoulder: She exhibits decreased range of motion.  Arms: Neurological: She is alert and oriented to person, place, and time.  Skin: Skin is warm and dry.  Psychiatric: She has a normal mood and affect. Judgment normal.    ED Course  Procedures (including critical care time)  Labs Reviewed - No data to display Dg Lumbar Spine Complete  11/09/2011  *RADIOLOGY REPORT*  Clinical Data: Recent fall, lower back pain.  LUMBAR SPINE - COMPLETE 4+ VIEW  Comparison: 08/12/2010 CT  Findings: The imaged vertebral bodies and inter-vertebral disc spaces are maintained. No displaced acute fracture or dislocation identified.   The para-vertebral and overlying soft tissues are within normal limits.  Maintained sacroiliac joints.  IMPRESSION: No acute osseous abnormality identified.  Original Report Authenticated By: Waneta Martins, M.D.     No diagnosis found.    MDM          Worthy Rancher, PA 11/09/11 2145

## 2011-11-09 NOTE — ED Provider Notes (Signed)
Medical screening examination/treatment/procedure(s) were performed by non-physician practitioner and as supervising physician I was immediately available for consultation/collaboration.  Nicoletta Dress. Colon Branch, MD 11/09/11 [phone]

## 2011-11-09 NOTE — ED Notes (Signed)
Patient states was walking down steps this morning and fell backwards.  States hit her lower back on a step and c/o right arm pain and states she scratched the back of her right leg.

## 2012-01-29 ENCOUNTER — Ambulatory Visit
Admission: RE | Admit: 2012-01-29 | Discharge: 2012-01-29 | Disposition: A | Discharge status: Home / Self Care | Payer: 59 | Source: Ambulatory Visit | Attending: Nephrology | Admitting: Nephrology

## 2012-01-29 ENCOUNTER — Other Ambulatory Visit: Payer: Self-pay | Admitting: Nephrology

## 2012-01-29 DIAGNOSIS — F17200 Nicotine dependence, unspecified, uncomplicated: Secondary | ICD-10-CM

## 2012-02-05 ENCOUNTER — Ambulatory Visit
Admission: RE | Admit: 2012-02-05 | Discharge: 2012-02-05 | Disposition: A | Discharge status: Home / Self Care | Payer: 59 | Source: Ambulatory Visit | Attending: Nephrology | Admitting: Nephrology

## 2012-02-05 ENCOUNTER — Other Ambulatory Visit: Payer: Self-pay | Admitting: Nephrology

## 2012-02-05 DIAGNOSIS — R52 Pain, unspecified: Secondary | ICD-10-CM

## 2012-02-25 ENCOUNTER — Other Ambulatory Visit: Payer: Self-pay | Admitting: Nephrology

## 2012-02-25 DIAGNOSIS — Z803 Family history of malignant neoplasm of breast: Secondary | ICD-10-CM

## 2012-02-25 DIAGNOSIS — Z1231 Encounter for screening mammogram for malignant neoplasm of breast: Secondary | ICD-10-CM

## 2012-02-29 ENCOUNTER — Ambulatory Visit
Admission: RE | Admit: 2012-02-29 | Discharge: 2012-02-29 | Disposition: A | Discharge status: Home / Self Care | Payer: 59 | Source: Ambulatory Visit | Attending: Nephrology | Admitting: Nephrology

## 2012-02-29 DIAGNOSIS — Z1231 Encounter for screening mammogram for malignant neoplasm of breast: Secondary | ICD-10-CM

## 2012-02-29 DIAGNOSIS — Z803 Family history of malignant neoplasm of breast: Secondary | ICD-10-CM

## 2012-10-05 ENCOUNTER — Encounter (HOSPITAL_COMMUNITY): Payer: Self-pay | Admitting: Emergency Medicine

## 2012-10-05 ENCOUNTER — Emergency Department (HOSPITAL_COMMUNITY)
Admission: EM | Admit: 2012-10-05 | Discharge: 2012-10-05 | Disposition: A | Discharge status: Home / Self Care | Payer: 59 | Source: Home / Self Care | Attending: Family Medicine | Admitting: Family Medicine

## 2012-10-05 DIAGNOSIS — H6690 Otitis media, unspecified, unspecified ear: Secondary | ICD-10-CM

## 2012-10-05 DIAGNOSIS — J069 Acute upper respiratory infection, unspecified: Secondary | ICD-10-CM

## 2012-10-05 HISTORY — DX: Bipolar disorder, unspecified: F31.9

## 2012-10-05 HISTORY — DX: Obesity, unspecified: E66.9

## 2012-10-05 MED ORDER — AMOXICILLIN 500 MG PO TABS: 500.0000 mg | ORAL_TABLET | Freq: Two times a day (BID) | ORAL | Status: DC

## 2012-10-05 NOTE — ED Notes (Signed)
Pt c/o right ear pain and not able to hear. Cough with chest congestion and sore throat. Fever comes and goes pt has used tylenol/ibuprofen for relief. Symptoms has been present x 4 days and gradually getting worse.

## 2012-10-05 NOTE — ED Provider Notes (Signed)
History     CSN: [id number]  Arrival date & time 10/05/12  1712   First MD Initiated Contact with Patient 10/05/12 1840      Chief Complaint  Patient presents with  . URI    cough,chest congesstion, right ear pain, can't hear in right ear. sore throat    (Consider location/radiation/quality/duration/timing/severity/associated sxs/prior treatment) HPI Comments: Pt with cold sx for last several days, treating successfully with otc meds.  Woke up this morning with R ear pain that is worsening.  Also c/o can't hear as well out of ear anymore. Mild fever when illness began, none now. Primary concern is ear pain  Patient is a 36 y.o. female presenting with URI. The history is provided by the patient.  URI The primary symptoms include fever, ear pain, sore throat and cough. The current episode started 3 to 5 days ago. This is a new problem. The problem has been gradually improving.  The ear pain began today. Ear pain is a new problem. The ear pain has been gradually worsening since its onset. The right ear is affected. The pain is moderate.    Symptoms associated with the illness include congestion and rhinorrhea. The illness is not associated with sinus pressure.    Past Medical History  Diagnosis Date  . Endometriosis   . Polycystic ovarian disease   . Obesity     Past Surgical History  Procedure Date  . Abdominal hysterectomy     History reviewed. No pertinent family history.  History  Substance Use Topics  . Smoking status: Current Every Day Smoker -- 1.0 packs/day    Types: Cigarettes  . Smokeless tobacco: Not on file  . Alcohol Use: No    OB History    Grav Para Term Preterm Abortions TAB SAB Ect Mult Living                  Review of Systems  Constitutional: Positive for fever.  HENT: Positive for ear pain, congestion, sore throat, rhinorrhea and postnasal drip. Negative for sinus pressure.   Respiratory: Positive for cough.     Allergies  Bee  venom  Home Medications   Current Outpatient Rx  Name  Route  Sig  Dispense  Refill  . IBUPROFEN 200 MG PO TABS   Oral   Take 800 mg by mouth as needed. For pain          . LAMOTRIGINE 100 MG PO TABS   Oral   Take 100 mg by mouth 2 (two) times daily.           . ALBUTEROL SULFATE HFA 108 (90 BASE) MCG/ACT IN AERS   Inhalation   Inhale 1-2 puffs into the lungs every 6 (six) hours as needed for wheezing.   1 Inhaler   0   . AMOXICILLIN 500 MG PO TABS   Oral   Take 1 tablet (500 mg total) by mouth 2 (two) times daily.   20 tablet   0   . CLONAZEPAM 0.5 MG PO TABS   Oral   Take 0.5 mg by mouth 2 (two) times daily as needed. anxiety          . EPINEPHRINE 0.3 MG/0.3ML IJ DEVI   Intramuscular   Inject 0.3 mLs (0.3 mg total) into the muscle once.   1 Device   0   . ADULT MULTIVITAMIN W/MINERALS CH   Oral   Take 1 tablet by mouth daily.           Marland Kitchen  RISPERIDONE 1 MG PO TABS   Oral   Take 1 mg by mouth at bedtime.             BP 140/100  Pulse 102  Temp 98.6 F (37 C) (Oral)  Resp 20  SpO2 96%  Physical Exam  Constitutional: She appears well-developed and well-nourished. No distress.       Morbid obesity  HENT:  Right Ear: External ear and ear canal normal. Tympanic membrane is bulging.  Left Ear: External ear and ear canal normal. A middle ear effusion is present.  Nose: Mucosal edema present. Right sinus exhibits no maxillary sinus tenderness and no frontal sinus tenderness. Left sinus exhibits no maxillary sinus tenderness and no frontal sinus tenderness.  Mouth/Throat: Posterior oropharyngeal erythema present.       R TM with mild bulging, small amount pus color fluid behind TM c/w early OM  Cardiovascular: Normal rate and regular rhythm.   Pulmonary/Chest: Effort normal and breath sounds normal.    ED Course  Procedures (including critical care time)  Labs Reviewed - No data to display No results found.   1. Otitis media   2. URI (upper  respiratory infection)       MDM  Neti pot, rx antibiotics to fill only if sx don't improve with conservative tx.         Cathlyn Parsons, NP 10/05/12 1904

## 2012-10-07 NOTE — ED Provider Notes (Signed)
Medical screening examination/treatment/procedure(s) were performed by non-physician practitioner and as supervising physician I was immediately available for consultation/collaboration.   [facility]; MD   Sharin Grave, MD 10/07/12 [phone]

## 2012-11-02 ENCOUNTER — Encounter (HOSPITAL_COMMUNITY): Payer: Self-pay | Admitting: *Deleted

## 2012-11-02 ENCOUNTER — Emergency Department (HOSPITAL_COMMUNITY): Payer: 59

## 2012-11-02 ENCOUNTER — Emergency Department (HOSPITAL_COMMUNITY)
Admission: EM | Admit: 2012-11-02 | Discharge: 2012-11-02 | Disposition: A | Discharge status: Home / Self Care | Payer: 59 | Attending: Emergency Medicine | Admitting: Emergency Medicine

## 2012-11-02 DIAGNOSIS — J9801 Acute bronchospasm: Secondary | ICD-10-CM | POA: Insufficient documentation

## 2012-11-02 DIAGNOSIS — Z8742 Personal history of other diseases of the female genital tract: Secondary | ICD-10-CM | POA: Insufficient documentation

## 2012-11-02 DIAGNOSIS — E669 Obesity, unspecified: Secondary | ICD-10-CM | POA: Insufficient documentation

## 2012-11-02 DIAGNOSIS — F17200 Nicotine dependence, unspecified, uncomplicated: Secondary | ICD-10-CM | POA: Insufficient documentation

## 2012-11-02 DIAGNOSIS — Z79899 Other long term (current) drug therapy: Secondary | ICD-10-CM | POA: Insufficient documentation

## 2012-11-02 DIAGNOSIS — F319 Bipolar disorder, unspecified: Secondary | ICD-10-CM | POA: Insufficient documentation

## 2012-11-02 MED ORDER — ALBUTEROL SULFATE HFA 108 (90 BASE) MCG/ACT IN AERS
2.0000 | INHALATION_SPRAY | RESPIRATORY_TRACT | Status: DC | PRN
  Administered 2012-11-02: 2 via RESPIRATORY_TRACT
  Filled 2012-11-02 (×2): qty 6.7

## 2012-11-02 MED ORDER — ALBUTEROL SULFATE (5 MG/ML) 0.5% IN NEBU
5.0000 mg | INHALATION_SOLUTION | Freq: Once | RESPIRATORY_TRACT | Status: AC
  Administered 2012-11-02: 5 mg via RESPIRATORY_TRACT
  Filled 2012-11-02: qty 1

## 2012-11-02 NOTE — ED Notes (Signed)
EMS called for SOB. Resp. rate 30bpm when they arrived. Pt in obvious distress upon their arrival. Complained of ha and numbness to fingertips. Pt placed on nonRebreather and [place] intermittently. Pt felt improvement with coaching and o2 from EMS but did want to come in to ED Amy Reese, Amy Reese

## 2012-11-02 NOTE — ED Provider Notes (Signed)
History     CSN: [id number]  Arrival date & time 11/02/12  2006   First MD Initiated Contact with Patient 11/02/12 2040      Chief Complaint  Patient presents with  . Panic Attack    (Consider location/radiation/quality/duration/timing/severity/associated sxs/prior treatment) HPI Pt presents with c/o shortness of breath and tightness of her breathing.  She states she began to have a cough this morning and chest tightness has been associated with this.  Cough nonproductive.  No fever/chills.  No sore throat.  She states that this evening she was walking in the kitchen and sob became worse which made her very anxious- she began to feel lightheaded and tingling in her fingertips.  She felt improved with O2 given by 911.  She is a 1ppd smoker.  Remote hx of wheezing. No chest pain, no leg swelling.   There are no other associated systemic symptoms, there are no other alleviating or modifying factors.   Past Medical History  Diagnosis Date  . Endometriosis   . Polycystic ovarian disease   . Obesity   . Bipolar disorder     Past Surgical History  Procedure Date  . Abdominal hysterectomy     History reviewed. No pertinent family history.  History  Substance Use Topics  . Smoking status: Current Every Day Smoker -- 1.0 packs/day    Types: Cigarettes  . Smokeless tobacco: Not on file  . Alcohol Use: No    OB History    Grav Para Term Preterm Abortions TAB SAB Ect Mult Living                  Review of Systems ROS reviewed and all otherwise negative except for mentioned in HPI  Allergies  Bee venom  Home Medications   Current Outpatient Rx  Name  Route  Sig  Dispense  Refill  . LAMOTRIGINE 100 MG PO TABS   Oral   Take 100 mg by mouth 2 (two) times daily.           . ADULT MULTIVITAMIN W/MINERALS CH   Oral   Take 1 tablet by mouth daily.           . SULINDAC 150 MG PO TABS   Oral   Take 150 mg by mouth daily.         . VENLAFAXINE HCL ER 75 MG PO  CP24   Oral   Take 75 mg by mouth daily.           BP 146/73  Pulse 109  Temp 97.5 F (36.4 C) (Oral)  Resp 26  SpO2 97% Vitals reviewed Physical Exam Physical Examination: General appearance - alert, well appearing, and in no distress Mental status - alert, oriented to person, place, and time Eyes- no scleral icterus, no conjunctival injection Mouth - mucous membranes moist, pharynx normal without lesions Chest - bilateral mild wheezing, BSS, rales or rhonchi, symmetric air entry, no increased respiratory effort Heart - normal rate, regular rhythm, normal S1, S2, no murmurs, rubs, clicks or gallops Abdomen - soft, nontender, nondistended, no masses or organomegaly Extremities - peripheral pulses normal, no pedal edema, no clubbing or cyanosis Skin - normal coloration and turgor, no rashes Psych- normal mood and affect  ED Course  Procedures (including critical care time)  Labs Reviewed - No data to display Dg Chest 2 View  11/02/2012  *RADIOLOGY REPORT*  Clinical Data: Shortness of breath.  CHEST - 2 VIEW  Comparison:  01/29/2012  Findings:  The heart size and mediastinal contours are within normal limits.  Both lungs are clear.  The visualized skeletal structures are unremarkable.  IMPRESSION: No active cardiopulmonary disease.   Original Report Authenticated By: Myles Rosenthal, M.D.      1. Bronchospasm       MDM  Pt presents with c/o shortness of breath and cough which started earlier today and worsened tonight.  Pt has wheezing on exam- mild.  She is a smoker, she also has body aches.  I have a low suspicion for PE, ACS or other acute emergent condition at this time. Wheezing cleared with albuterol, pt given albuterol MDI for bronchospasm/bronchitis.  Discharged with strict return precautions.  Pt agreeable with plan.        Ethelda Chick, MD 11/02/12 2158

## 2012-11-03 ENCOUNTER — Emergency Department (HOSPITAL_COMMUNITY): Payer: 59

## 2012-11-03 ENCOUNTER — Emergency Department (HOSPITAL_COMMUNITY)
Admission: EM | Admit: 2012-11-03 | Discharge: 2012-11-03 | Disposition: A | Discharge status: Home / Self Care | Payer: 59 | Attending: Emergency Medicine | Admitting: Emergency Medicine

## 2012-11-03 ENCOUNTER — Encounter (HOSPITAL_COMMUNITY): Payer: Self-pay

## 2012-11-03 DIAGNOSIS — F17200 Nicotine dependence, unspecified, uncomplicated: Secondary | ICD-10-CM | POA: Insufficient documentation

## 2012-11-03 DIAGNOSIS — R5383 Other fatigue: Secondary | ICD-10-CM | POA: Insufficient documentation

## 2012-11-03 DIAGNOSIS — R112 Nausea with vomiting, unspecified: Secondary | ICD-10-CM | POA: Insufficient documentation

## 2012-11-03 DIAGNOSIS — R51 Headache: Secondary | ICD-10-CM | POA: Insufficient documentation

## 2012-11-03 DIAGNOSIS — R5381 Other malaise: Secondary | ICD-10-CM | POA: Insufficient documentation

## 2012-11-03 DIAGNOSIS — R0602 Shortness of breath: Secondary | ICD-10-CM | POA: Insufficient documentation

## 2012-11-03 DIAGNOSIS — B349 Viral infection, unspecified: Secondary | ICD-10-CM

## 2012-11-03 DIAGNOSIS — Z9889 Other specified postprocedural states: Secondary | ICD-10-CM | POA: Insufficient documentation

## 2012-11-03 DIAGNOSIS — B9789 Other viral agents as the cause of diseases classified elsewhere: Secondary | ICD-10-CM | POA: Insufficient documentation

## 2012-11-03 DIAGNOSIS — F319 Bipolar disorder, unspecified: Secondary | ICD-10-CM | POA: Insufficient documentation

## 2012-11-03 DIAGNOSIS — R062 Wheezing: Secondary | ICD-10-CM | POA: Insufficient documentation

## 2012-11-03 DIAGNOSIS — IMO0001 Reserved for inherently not codable concepts without codable children: Secondary | ICD-10-CM | POA: Insufficient documentation

## 2012-11-03 DIAGNOSIS — E669 Obesity, unspecified: Secondary | ICD-10-CM | POA: Insufficient documentation

## 2012-11-03 DIAGNOSIS — R197 Diarrhea, unspecified: Secondary | ICD-10-CM | POA: Insufficient documentation

## 2012-11-03 DIAGNOSIS — Z8742 Personal history of other diseases of the female genital tract: Secondary | ICD-10-CM | POA: Insufficient documentation

## 2012-11-03 DIAGNOSIS — Z79899 Other long term (current) drug therapy: Secondary | ICD-10-CM | POA: Insufficient documentation

## 2012-11-03 DIAGNOSIS — J40 Bronchitis, not specified as acute or chronic: Secondary | ICD-10-CM | POA: Insufficient documentation

## 2012-11-03 DIAGNOSIS — R509 Fever, unspecified: Secondary | ICD-10-CM | POA: Insufficient documentation

## 2012-11-03 DIAGNOSIS — M549 Dorsalgia, unspecified: Secondary | ICD-10-CM | POA: Insufficient documentation

## 2012-11-03 LAB — URINALYSIS, ROUTINE W REFLEX MICROSCOPIC
Glucose, UA: NEGATIVE mg/dL
Hgb urine dipstick: NEGATIVE
Protein, ur: NEGATIVE mg/dL
Specific Gravity, Urine: 1.02 (ref 1.005–1.030)

## 2012-11-03 LAB — BASIC METABOLIC PANEL WITH GFR
BUN: 7 mg/dL (ref 6–23)
CO2: 29 meq/L (ref 19–32)
Calcium: 9.5 mg/dL (ref 8.4–10.5)
Chloride: 98 meq/L (ref 96–112)
Creatinine, Ser: 0.72 mg/dL (ref 0.50–1.10)
GFR calc Af Amer: >90 mL/min
GFR calc non Af Amer: >90 mL/min
Glucose, Bld: 117 mg/dL — ABNORMAL HIGH (ref 70–99)
Potassium: 4.5 meq/L (ref 3.5–5.1)
Sodium: 136 meq/L (ref 135–145)

## 2012-11-03 LAB — CBC WITH DIFFERENTIAL/PLATELET
Eosinophils Relative: 0 % (ref 0–5)
HCT: 44.9 % (ref 36.0–46.0)
Hemoglobin: 14.6 g/dL (ref 12.0–15.0)
Lymphocytes Relative: 17 % (ref 12–46)
Lymphs Abs: 0.5 10*3/uL — ABNORMAL LOW (ref 0.7–4.0)
MCV: 94.1 fL (ref 78.0–100.0)
Platelets: 188 10*3/uL (ref 150–400)
RBC: 4.77 MIL/uL (ref 3.87–5.11)
WBC: 3 10*3/uL — ABNORMAL LOW (ref 4.0–10.5)

## 2012-11-03 MED ORDER — HYDROMORPHONE HCL PF 1 MG/ML IJ SOLN
1.0000 mg | Freq: Once | INTRAMUSCULAR | Status: AC
  Administered 2012-11-03: 1 mg via INTRAMUSCULAR
  Filled 2012-11-03: qty 1

## 2012-11-03 MED ORDER — ONDANSETRON 8 MG PO TBDP
8.0000 mg | ORAL_TABLET | Freq: Once | ORAL | Status: AC
  Administered 2012-11-03: 8 mg via ORAL
  Filled 2012-11-03: qty 1

## 2012-11-03 MED ORDER — ONDANSETRON 8 MG PO TBDP: 8.0000 mg | ORAL_TABLET | Freq: Three times a day (TID) | ORAL | Status: DC | PRN

## 2012-11-03 MED ORDER — SODIUM CHLORIDE 0.9 % IV BOLUS (SEPSIS): 500.0000 mL | Freq: Once | INTRAVENOUS | Status: DC

## 2012-11-03 MED ORDER — OXYCODONE-ACETAMINOPHEN 5-325 MG PO TABS: 1.0000 | ORAL_TABLET | Freq: Four times a day (QID) | ORAL | Status: DC | PRN

## 2012-11-03 MED ORDER — ONDANSETRON HCL 4 MG/2ML IJ SOLN
4.0000 mg | Freq: Once | INTRAMUSCULAR | Status: DC
  Filled 2012-11-03: qty 2

## 2012-11-03 MED ORDER — MORPHINE SULFATE 4 MG/ML IJ SOLN: 4.0000 mg | Freq: Once | INTRAMUSCULAR | Status: DC

## 2012-11-03 NOTE — ED Notes (Signed)
Pt seen at [facility] yesterday and diagnosed with broncial spasms. Pt notes increased SOB, cough, body aches and headache. State pain with cough and coughing with deep breathing. Pt aax4

## 2012-11-03 NOTE — ED Notes (Signed)
Pt resting comfortably at this time, NAD, states moderate relief of pain.

## 2012-11-03 NOTE — ED Notes (Signed)
Pt with complaints of cough and SOB that started the previous day. Was seen at [facility] the previous day and diagnosed with bronchial spasms.

## 2012-11-03 NOTE — ED Notes (Signed)
IV access not able to be obtained. Two RN's attempted.Night shift aware.

## 2012-11-03 NOTE — ED Provider Notes (Signed)
History     CSN: [id number]  Arrival date & time 11/03/12  1535   First MD Initiated Contact with Patient 11/03/12 1800      Chief Complaint  Patient presents with  . Cough  . Shortness of Breath    (Consider location/radiation/quality/duration/timing/severity/associated sxs/prior treatment) Patient is a 36 y.o. female presenting with cough and shortness of breath. The history is provided by the patient.  Cough This is a new problem. Associated symptoms include headaches and shortness of breath. Pertinent negatives include no chest pain.  Shortness of Breath  Associated symptoms include cough and shortness of breath. Pertinent negatives include no chest pain.   patient has had cough and shortness of breath the last few days. She was seen at [facility] hospital yesterday and was diagnosed with bronchitis. She states that since then the breathing is improved, but she feels worse overall. She states she now has nausea, vomiting and diarrhea. She aches all over she states her head and back hurt a lot from the coughing. She states she's had chills and possibly fevers. No specific chest pain. No abdominal pain. No swelling or legs.  Past Medical History  Diagnosis Date  . Endometriosis   . Polycystic ovarian disease   . Obesity   . Bipolar disorder     Past Surgical History  Procedure Date  . Abdominal hysterectomy     History reviewed. No pertinent family history.  History  Substance Use Topics  . Smoking status: Current Every Day Smoker -- 1.0 packs/day    Types: Cigarettes  . Smokeless tobacco: Not on file  . Alcohol Use: No    OB History    Grav Para Term Preterm Abortions TAB SAB Ect Mult Living                  Review of Systems  Constitutional: Negative for activity change and appetite change.  HENT: Negative for neck stiffness.   Eyes: Negative for pain.  Respiratory: Positive for cough and shortness of breath. Negative for chest tightness.     Cardiovascular: Negative for chest pain and leg swelling.  Gastrointestinal: Positive for nausea, vomiting and diarrhea. Negative for abdominal pain.  Genitourinary: Negative for flank pain.  Musculoskeletal: Positive for back pain.  Skin: Negative for rash.  Neurological: Positive for headaches. Negative for weakness and numbness.  Psychiatric/Behavioral: Negative for behavioral problems.    Allergies  Bee venom  Home Medications   Current Outpatient Rx  Name  Route  Sig  Dispense  Refill  . DEXTROMETHORPHAN POLISTIREX ER 30 MG/5ML PO LQCR   Oral   Take 60 mg by mouth as needed. For cough         . IBUPROFEN 200 MG PO TABS   Oral   Take 800 mg by mouth daily as needed. For pain/fever         . LAMOTRIGINE 100 MG PO TABS   Oral   Take 100 mg by mouth 2 (two) times daily.           . ADULT MULTIVITAMIN W/MINERALS CH   Oral   Take 1 tablet by mouth daily.           Marland Kitchen PSEUDOEPH-DOXYLAMINE-DM-APAP 60-7.04-03-999 MG/30ML PO LIQD   Oral   Take 30 mLs by mouth once as needed. For cough/flu         . SULINDAC 150 MG PO TABS   Oral   Take 150 mg by mouth daily.         Marland Kitchen  VENLAFAXINE HCL ER 75 MG PO CP24   Oral   Take 75 mg by mouth daily.         Marland Kitchen ONDANSETRON 8 MG PO TBDP   Oral   Take 1 tablet (8 mg total) by mouth every 8 (eight) hours as needed for nausea.   20 tablet   0   . OXYCODONE-ACETAMINOPHEN 5-325 MG PO TABS   Oral   Take 1-2 tablets by mouth every 6 (six) hours as needed for pain.   12 tablet   0     BP 155/69  Pulse 110  Temp 98.3 F (36.8 C) (Oral)  Resp 18  Ht 5\' 6"  (1.676 m)  Wt 345 lb (156.491 kg)  BMI 55.68 kg/m2  SpO2 95%  Physical Exam  Nursing note and vitals reviewed. Constitutional: She is oriented to person, place, and time. She appears well-developed and well-nourished.       Patient is morbidly obese  HENT:  Head: Normocephalic and atraumatic.  Eyes: EOM are normal. Pupils are equal, round, and reactive to  light.  Neck: Normal range of motion. Neck supple.  Cardiovascular: Normal rate, regular rhythm and normal heart sounds.   No murmur heard. Pulmonary/Chest: Effort normal. No respiratory distress. She has wheezes. She has no rales.       Few scattered wheezes.  Abdominal: Soft. Bowel sounds are normal. She exhibits no distension. There is no tenderness. There is no rebound and no guarding.  Musculoskeletal: Normal range of motion.  Neurological: She is alert and oriented to person, place, and time. No cranial nerve deficit.  Skin: Skin is warm and dry.  Psychiatric: She has a normal mood and affect. Her speech is normal.    ED Course  Procedures (including critical care time)  Labs Reviewed  CBC WITH DIFFERENTIAL - Abnormal; Notable for the following:    WBC 3.0 (*)     Lymphs Abs 0.5 (*)     Monocytes Relative 17 (*)     All other components within normal limits  BASIC METABOLIC PANEL - Abnormal; Notable for the following:    Glucose, Bld 117 (*)     All other components within normal limits  URINALYSIS, ROUTINE W REFLEX MICROSCOPIC - Abnormal; Notable for the following:    APPearance CLOUDY (*)     pH 8.5 (*)     All other components within normal limits   Dg Chest 2 View  11/03/2012  *RADIOLOGY REPORT*  Clinical Data: History of coughing and shortness of breath. History of smoking.  CHEST - 2 VIEW  Comparison: 11/02/2012  Findings: Cardiac silhouette is at the upper range of normal size. Mediastinal and hilar contours appear stable.  There is obesity. Haziness of the lungs on PA image is felt to be secondary to overlying soft tissues.  No pulmonary edema, pneumonia, or pleural effusion is evident.  No skeletal lesions are seen.  On lateral image there appears to be some central peribronchial thickening.  IMPRESSION:  No consolidation or pleural effusion is evident.  There is central peribronchial thickening.  This may be associated with bronchitis, asthma, and reactive airway  disease.   Original Report Authenticated By: Onalee Hua Call    Dg Chest 2 View  11/02/2012  *RADIOLOGY REPORT*  Clinical Data: Shortness of breath.  CHEST - 2 VIEW  Comparison:  01/29/2012  Findings:  The heart size and mediastinal contours are within normal limits.  Both lungs are clear.  The visualized skeletal structures are unremarkable.  IMPRESSION: No active cardiopulmonary disease.   Original Report Authenticated By: Myles Rosenthal, M.D.      1. Viral infection       MDM  Patient presents with cough nausea vomiting diarrhea and generalized weakness. She is diagnosed with bronchial spasms yesterday it was a long hospital. Patient states she's feeling bad when she left and still feels bad. Lab work is overall reassuring. Chest x-ray was repeated under protocol and does not show pneumonia. We were unable to obtain IV access on her, however she feels better after IM and oral medications. She's tolerated orals will be discharged home. She's given prescription for pain medication and for anti-emetics.        Juliet Rude. Rubin Payor, MD 11/03/12 2034

## 2012-11-03 NOTE — ED Notes (Signed)
Unable to obtain IV access at this time, Dayshift attempted and I also attempted x1 with no success. EDP aware and ordered PO/IM meds

## 2013-01-17 ENCOUNTER — Emergency Department (HOSPITAL_COMMUNITY): Payer: 59

## 2013-01-17 ENCOUNTER — Emergency Department (HOSPITAL_COMMUNITY)
Admission: EM | Admit: 2013-01-17 | Discharge: 2013-01-17 | Disposition: A | Discharge status: Home / Self Care | Payer: 59 | Attending: Emergency Medicine | Admitting: Emergency Medicine

## 2013-01-17 ENCOUNTER — Encounter (HOSPITAL_COMMUNITY): Payer: Self-pay | Admitting: Emergency Medicine

## 2013-01-17 DIAGNOSIS — S0990XA Unspecified injury of head, initial encounter: Secondary | ICD-10-CM | POA: Insufficient documentation

## 2013-01-17 DIAGNOSIS — W1789XA Other fall from one level to another, initial encounter: Secondary | ICD-10-CM | POA: Insufficient documentation

## 2013-01-17 DIAGNOSIS — S335XXA Sprain of ligaments of lumbar spine, initial encounter: Secondary | ICD-10-CM | POA: Insufficient documentation

## 2013-01-17 DIAGNOSIS — Z79899 Other long term (current) drug therapy: Secondary | ICD-10-CM | POA: Insufficient documentation

## 2013-01-17 DIAGNOSIS — Y92009 Unspecified place in unspecified non-institutional (private) residence as the place of occurrence of the external cause: Secondary | ICD-10-CM | POA: Insufficient documentation

## 2013-01-17 DIAGNOSIS — F319 Bipolar disorder, unspecified: Secondary | ICD-10-CM | POA: Insufficient documentation

## 2013-01-17 DIAGNOSIS — S139XXA Sprain of joints and ligaments of unspecified parts of neck, initial encounter: Secondary | ICD-10-CM

## 2013-01-17 DIAGNOSIS — Z8742 Personal history of other diseases of the female genital tract: Secondary | ICD-10-CM | POA: Insufficient documentation

## 2013-01-17 DIAGNOSIS — Y9389 Activity, other specified: Secondary | ICD-10-CM | POA: Insufficient documentation

## 2013-01-17 DIAGNOSIS — F17200 Nicotine dependence, unspecified, uncomplicated: Secondary | ICD-10-CM | POA: Insufficient documentation

## 2013-01-17 MED ORDER — OXYCODONE-ACETAMINOPHEN 5-325 MG PO TABS
1.0000 | ORAL_TABLET | Freq: Once | ORAL | Status: AC
  Administered 2013-01-17: 1 via ORAL
  Filled 2013-01-17: qty 1

## 2013-01-17 MED ORDER — CYCLOBENZAPRINE HCL 10 MG PO TABS
10.0000 mg | ORAL_TABLET | Freq: Once | ORAL | Status: AC
  Administered 2013-01-17: 10 mg via ORAL
  Filled 2013-01-17: qty 1

## 2013-01-17 MED ORDER — CYCLOBENZAPRINE HCL 10 MG PO TABS: 10.0000 mg | ORAL_TABLET | Freq: Three times a day (TID) | ORAL | Status: DC | PRN

## 2013-01-17 MED ORDER — OXYCODONE-ACETAMINOPHEN 5-325 MG PO TABS: 1.0000 | ORAL_TABLET | ORAL | Status: DC | PRN

## 2013-01-17 NOTE — ED Provider Notes (Signed)
History  This chart was scribed for Amy Human, MD by Bennett Scrape, ED Scribe. This patient was seen in room [facility] and the patient's care was started at 3:05 PM.  CSN: [id number]  Arrival date & time 01/17/13  1447   First MD Initiated Contact with Patient 01/17/13 1505      Chief Complaint  Patient presents with  . Fall    Patient is a 37 y.o. female presenting with fall. The history is provided by the patient. No language interpreter was used.  Fall The accident occurred less than 1 hour ago. The fall occurred from window/balcony. She fell from a height of 3 to 5 ft. She landed on grass. There was no blood loss. The point of impact was the neck. The pain is present in the neck. She was ambulatory at the scene. There was no entrapment after the fall. Pertinent negatives include no fever and no vomiting. Treatment on scene includes a c-collar and a backboard.    Gene Colee is a 37 y.o. female brought in by ambulance on a LSB and in a c-collar, who presents to the Emergency Department complaining of a fall off of a 4 foot porch onto her back and head. Pt states that she was trying to get out of the way while her family was putting up the rail around the porch, misjudged the end of the porch and stepped off falling backwards. She denies LOC and states that she ambulated to the EMS stretcher. She c/o associated right arm pain, neck pain and back pain. She reports that she is on Lamictal and Effexor for bipolar disorder and clinoril for arthritis. She is a 1ppd smoker but denies alcohol or illegal drug use.  Past Medical History  Diagnosis Date  . Endometriosis   . Polycystic ovarian disease   . Obesity   . Bipolar disorder     Past Surgical History  Procedure Laterality Date  . Abdominal hysterectomy      History reviewed. No pertinent family history.  History  Substance Use Topics  . Smoking status: Current Every Day Smoker -- 1.00 packs/day    Types:  Cigarettes  . Smokeless tobacco: Not on file  . Alcohol Use: No    No OB history provided.  Review of Systems  Constitutional: Negative for fever and chills.  HENT: Positive for neck pain. Negative for sore throat.   Respiratory: Negative for cough.   Gastrointestinal: Negative for vomiting and diarrhea.  Genitourinary: Negative for dysuria.  Musculoskeletal: Positive for back pain and arthralgias (right ankle pain).  Skin: Negative for rash.  Neurological: Negative for syncope.  All other systems reviewed and are negative.    Allergies  Bee venom  Home Medications   Current Outpatient Rx  Name  Route  Sig  Dispense  Refill  . dextromethorphan (DELSYM) 30 MG/5ML liquid   Oral   Take 60 mg by mouth as needed. For cough         . ibuprofen (ADVIL,MOTRIN) 200 MG tablet   Oral   Take 800 mg by mouth daily as needed. For pain/fever         . lamoTRIgine (LAMICTAL) 100 MG tablet   Oral   Take 100 mg by mouth 2 (two) times daily.           . Multiple Vitamin (MULITIVITAMIN WITH MINERALS) TABS   Oral   Take 1 tablet by mouth daily.           Marland Kitchen  ondansetron (ZOFRAN-ODT) 8 MG disintegrating tablet   Oral   Take 1 tablet (8 mg total) by mouth every 8 (eight) hours as needed for nausea.   20 tablet   0   . oxyCODONE-acetaminophen (PERCOCET/ROXICET) 5-325 MG per tablet   Oral   Take 1-2 tablets by mouth every 6 (six) hours as needed for pain.   12 tablet   0   . Pseudoeph-Doxylamine-DM-APAP (NYQUIL) 60-7.04-03-999 MG/30ML LIQD   Oral   Take 30 mLs by mouth once as needed. For cough/flu         . sulindac (CLINORIL) 150 MG tablet   Oral   Take 150 mg by mouth daily.         Marland Kitchen venlafaxine XR (EFFEXOR-XR) 75 MG 24 hr capsule   Oral   Take 75 mg by mouth daily.           Triage Vitals: BP 148/82  Pulse 82  Temp(Src) 97.6 F (36.4 C) (Oral)  Resp 20  SpO2 100%  Physical Exam  Nursing note and vitals reviewed. Constitutional: She is oriented to  person, place, and time. No distress. Cervical collar and backboard in place.  Morbidly obese  HENT:  Head: Normocephalic and atraumatic.  Eyes: EOM are normal.  Neck: No tracheal deviation present.  Short thick neck, no palpable deformities, very mild tenderness with turning neck passively from side to side   Cardiovascular: Normal rate and regular rhythm.  Exam reveals no gallop and no friction rub.   No murmur heard. Pulmonary/Chest: Effort normal and breath sounds normal. No respiratory distress. She exhibits no tenderness.  Abdominal: Soft. There is no tenderness.  Massively obese   Musculoskeletal: Normal range of motion.  tenderness to palpation of pelvis, tenderness to then lumbar region with palpation of the pelvis, limited back exam due to LSB, no palpable deformities noted, tenderness to the right ankle, no palpable deformities,  neurovascularly intact throughout  Neurological: She is alert and oriented to person, place, and time.  Sensation and strength intact throughout   Skin: Skin is warm and dry.  Psychiatric: She has a normal mood and affect. Her behavior is normal.    ED Course  Procedures (including critical care time)  DIAGNOSTIC STUDIES: Oxygen Saturation is 100% on room air, normal by my interpretation.    COORDINATION OF CARE: 3:14 PM-Discussed treatment plan which includes CT of c-spine and head, DG ankle, L-spine and CXR with pt at bedside and pt agreed to plan.   3:30 PM- Ordered one tablet of 5-325 mg oxycodone and 10 mg Flexeril tablet  Dg Chest 1 View  01/17/2013  *RADIOLOGY REPORT*  Clinical Data: Fall from 4 feet height off porch.  Chest and back pain.  Morbid obesity.  CHEST - 1 VIEW  Comparison:  11/03/2012  Findings: The heart size and mediastinal contours are within normal limits.  Both lungs are clear.  No evidence of pneumothorax, mediastinal widening, tracheal deviation.  No significant change seen compared to prior exam.  IMPRESSION: No active  disease.   Original Report Authenticated By: Myles Rosenthal, M.D.    Dg Lumbar Spine Complete  01/17/2013  *RADIOLOGY REPORT*  Clinical Data: Fall from 4 feet height onto back.  Back pain. Morbid obesity.  LUMBAR SPINE - COMPLETE 4+ VIEW  Comparison: 02/05/2012  Findings: No evidence of acute fracture, spondylolysis, or spondylolisthesis.  Intervertebral disc spaces are maintained.  No evidence of facet arthropathy other significant bone abnormality.  IMPRESSION: Negative lumbar spine radiographs.  Original Report Authenticated By: Myles Rosenthal, M.D.    Dg Ankle Complete Right  01/17/2013  *RADIOLOGY REPORT*  Clinical Data: Fall from 4 feet height.  Ankle injury and pain. Morbid obesity.  RIGHT ANKLE - COMPLETE 3+ VIEW  Comparison: 09/04/2006  Findings: No evidence of fracture or dislocation.  No evidence of ankle joint effusion.  Plantar and dorsal calcaneal spurs noted.  IMPRESSION: No acute findings.   Original Report Authenticated By: Myles Rosenthal, M.D.    Ct Head Wo Contrast  01/17/2013  *RADIOLOGY REPORT*  Clinical Data:  Larey Seat from height of 4 feet with trauma to the head and neck  CT HEAD WITHOUT CONTRAST CT CERVICAL SPINE WITHOUT CONTRAST  Technique:  Multidetector CT imaging of the head and cervical spine was performed following the standard protocol without intravenous contrast.  Multiplanar CT image reconstructions of the cervical spine were also generated.  Comparison:  09/04/2006  CT HEAD  Findings: The brain shows normal appearance without evidence of malformation, atrophy, old or acute infarction, mass lesion, hemorrhage, hydrocephalus or extra-axial collection.  No skull fracture.  No fluid in the sinuses, middle ears or mastoids.  IMPRESSION: Normal head CT  CT CERVICAL SPINE  Findings: Normal alignment.  No visible fracture.  No significant degenerative change.  Detail is poor because of patient size.  IMPRESSION: Poor detail because of patient size.  No acute or traumatic finding.   Original  Report Authenticated By: Paulina Fusi, M.D.    Ct Cervical Spine Wo Contrast  01/17/2013  *RADIOLOGY REPORT*  Clinical Data:  Larey Seat from height of 4 feet with trauma to the head and neck  CT HEAD WITHOUT CONTRAST CT CERVICAL SPINE WITHOUT CONTRAST  Technique:  Multidetector CT imaging of the head and cervical spine was performed following the standard protocol without intravenous contrast.  Multiplanar CT image reconstructions of the cervical spine were also generated.  Comparison:  09/04/2006  CT HEAD  Findings: The brain shows normal appearance without evidence of malformation, atrophy, old or acute infarction, mass lesion, hemorrhage, hydrocephalus or extra-axial collection.  No skull fracture.  No fluid in the sinuses, middle ears or mastoids.  IMPRESSION: Normal head CT  CT CERVICAL SPINE  Findings: Normal alignment.  No visible fracture.  No significant degenerative change.  Detail is poor because of patient size.  IMPRESSION: Poor detail because of patient size.  No acute or traumatic finding.   Original Report Authenticated By: Paulina Fusi, M.D.    5:12 PM CT of head and neck, x-rays of chest and LS spine and right ankle all negative.  Rx Percocet q4h prn pain, Flexeril 10 mg tid for muscle spasm.  1. Fall at home, initial encounter   2. Cervical sprain, initial encounter   3. Lumbar sprain, initial encounter        Carleene Cooper III, MD 01/17/13 [phone]

## 2013-01-17 NOTE — ED Notes (Signed)
Pt reports she fell 25ft off of her front porch. Pt denies LOC. Pt alert and oriented. Pt c/o of neck,back pain. Pt fully immobilized and on LSB. nad noted at this time.

## 2013-02-03 NOTE — ED Provider Notes (Signed)
I received a call from pharmacy that patient is just now presenting her prescription from 3/15 to be filled. Advised to not fill the narcotic, percocet and can have the muscle relaxer if she wants it.   Ward Givens, MD 02/03/13 1218

## 2013-04-17 ENCOUNTER — Encounter (HOSPITAL_COMMUNITY): Payer: Self-pay | Admitting: *Deleted

## 2013-04-17 ENCOUNTER — Emergency Department (HOSPITAL_COMMUNITY)
Admission: EM | Admit: 2013-04-17 | Discharge: 2013-04-17 | Disposition: A | Discharge status: Home / Self Care | Payer: 59 | Attending: Emergency Medicine | Admitting: Emergency Medicine

## 2013-04-17 DIAGNOSIS — Z79899 Other long term (current) drug therapy: Secondary | ICD-10-CM | POA: Insufficient documentation

## 2013-04-17 DIAGNOSIS — E669 Obesity, unspecified: Secondary | ICD-10-CM | POA: Insufficient documentation

## 2013-04-17 DIAGNOSIS — H0016 Chalazion left eye, unspecified eyelid: Secondary | ICD-10-CM

## 2013-04-17 DIAGNOSIS — H53149 Visual discomfort, unspecified: Secondary | ICD-10-CM | POA: Insufficient documentation

## 2013-04-17 DIAGNOSIS — H0019 Chalazion unspecified eye, unspecified eyelid: Secondary | ICD-10-CM | POA: Insufficient documentation

## 2013-04-17 DIAGNOSIS — F319 Bipolar disorder, unspecified: Secondary | ICD-10-CM | POA: Insufficient documentation

## 2013-04-17 DIAGNOSIS — F17200 Nicotine dependence, unspecified, uncomplicated: Secondary | ICD-10-CM | POA: Insufficient documentation

## 2013-04-17 DIAGNOSIS — H538 Other visual disturbances: Secondary | ICD-10-CM | POA: Insufficient documentation

## 2013-04-17 DIAGNOSIS — Z8742 Personal history of other diseases of the female genital tract: Secondary | ICD-10-CM | POA: Insufficient documentation

## 2013-04-17 DIAGNOSIS — H5789 Other specified disorders of eye and adnexa: Secondary | ICD-10-CM | POA: Insufficient documentation

## 2013-04-17 MED ORDER — KETOROLAC TROMETHAMINE 0.5 % OP SOLN
1.0000 [drp] | Freq: Once | OPHTHALMIC | Status: AC
  Administered 2013-04-17: 1 [drp] via OPHTHALMIC
  Filled 2013-04-17: qty 5

## 2013-04-17 MED ORDER — TOBRAMYCIN 0.3 % OP SOLN
1.0000 [drp] | Freq: Once | OPHTHALMIC | Status: AC
  Administered 2013-04-17: 1 [drp] via OPHTHALMIC
  Filled 2013-04-17: qty 5

## 2013-04-17 NOTE — ED Notes (Signed)
Lt eye pain for 3 days, Upper lid swollen and red, with tearing present.  Pt says she has had a d/c for her eye . Has been taking motrin and using warm compresses to  Her  Eye.  Has noticed pain in cheek and  Headache.

## 2013-04-17 NOTE — ED Provider Notes (Signed)
History     CSN: [id number]  Arrival date & time 04/17/13  1320   First MD Initiated Contact with Patient 04/17/13 1334      Chief Complaint  Patient presents with  . Eye Pain    (Consider location/radiation/quality/duration/timing/severity/associated sxs/prior treatment) HPI Comments: Patient c/o pain, redness and swelling to her left upper eyelid for 3 days.  States the symptoms are worsening and now states she has slightly blurred vision to her left eye. She also reports having drainage to her eye.  She denies fever, headaches, dizziness or recent illness.  She has not tried any OTC eye drops.  She does not wear contacts.    Patient is a 37 y.o. female presenting with conjunctivitis. The history is provided by the patient.  Conjunctivitis This is a new problem. The current episode started in the past 7 days. The problem occurs constantly. The problem has been unchanged. Associated symptoms include a visual change. Pertinent negatives include no arthralgias, chest pain, chills, congestion, coughing, fever, headaches, myalgias, nausea, neck pain, numbness, rash, sore throat, swollen glands, urinary symptoms, vertigo or vomiting. Exacerbated by: bright light and blinking. She has tried nothing for the symptoms. The treatment provided no relief.    Past Medical History  Diagnosis Date  . Endometriosis   . Polycystic ovarian disease   . Obesity   . Bipolar disorder     Past Surgical History  Procedure Laterality Date  . Abdominal hysterectomy      No family history on file.  History  Substance Use Topics  . Smoking status: Current Every Day Smoker -- 1.00 packs/day    Types: Cigarettes  . Smokeless tobacco: Not on file  . Alcohol Use: No    OB History   Grav Para Term Preterm Abortions TAB SAB Ect Mult Living                  Review of Systems  Constitutional: Negative for fever and chills.  HENT: Negative for congestion, sore throat, trouble swallowing, neck pain  and dental problem.   Eyes: Positive for photophobia, pain, discharge, redness and visual disturbance.  Respiratory: Negative for cough and chest tightness.   Cardiovascular: Negative for chest pain.  Gastrointestinal: Negative for nausea and vomiting.  Genitourinary: Negative for dysuria.  Musculoskeletal: Negative for myalgias and arthralgias.  Skin: Negative for color change and rash.  Neurological: Negative for dizziness, vertigo, syncope, facial asymmetry, numbness and headaches.  Hematological: Negative for adenopathy.  All other systems reviewed and are negative.    Allergies  Bee venom  Home Medications   Current Outpatient Rx  Name  Route  Sig  Dispense  Refill  . cyclobenzaprine (FLEXERIL) 10 MG tablet   Oral   Take 1 tablet (10 mg total) by mouth 3 (three) times daily as needed for muscle spasms.   15 tablet   0   . ibuprofen (ADVIL,MOTRIN) 200 MG tablet   Oral   Take 800 mg by mouth daily as needed. For pain/fever         . lamoTRIgine (LAMICTAL) 100 MG tablet   Oral   Take 100 mg by mouth 2 (two) times daily.           . Multiple Vitamin (MULITIVITAMIN WITH MINERALS) TABS   Oral   Take 1 tablet by mouth daily.           Marland Kitchen oxyCODONE-acetaminophen (PERCOCET/ROXICET) 5-325 MG per tablet   Oral   Take 1 tablet by mouth every  4 (four) hours as needed for pain.   20 tablet   0   . sulindac (CLINORIL) 150 MG tablet   Oral   Take 150 mg by mouth 2 (two) times daily.          Marland Kitchen venlafaxine XR (EFFEXOR-XR) 75 MG 24 hr capsule   Oral   Take 75 mg by mouth daily.           BP 151/86  Pulse 108  Temp(Src) 97.9 F (36.6 C) (Oral)  Resp 22  Ht 5\' 6"  (1.676 m)  Wt 340 lb (154.223 kg)  BMI 54.9 kg/m2  SpO2 98%  Physical Exam  Nursing note and vitals reviewed. Constitutional: She is oriented to person, place, and time. She appears well-developed and well-nourished. No distress.  HENT:  Head: Normocephalic and atraumatic.  Mouth/Throat:  Oropharynx is clear and moist.  Eyes: EOM are normal. Pupils are equal, round, and reactive to light. No foreign bodies found. Right eye exhibits no chemosis, no discharge, no exudate and no hordeolum. No foreign body present in the right eye. Left eye exhibits no chemosis, no discharge, no exudate and no hordeolum. No foreign body present in the left eye. Right conjunctiva is not injected. Right conjunctiva has no hemorrhage. Left conjunctiva is not injected. Left conjunctiva has no hemorrhage.    Neck: Normal range of motion. Neck supple.  Cardiovascular: Normal rate, regular rhythm, normal heart sounds and intact distal pulses.   No murmur heard. Pulmonary/Chest: Effort normal and breath sounds normal. No respiratory distress.  Musculoskeletal: Normal range of motion.  Lymphadenopathy:    She has no cervical adenopathy.  Neurological: She is alert and oriented to person, place, and time. She exhibits normal muscle tone. Coordination normal.  Skin: Skin is warm and dry.  Psychiatric: She has a normal mood and affect. Her behavior is normal.    ED Course  Procedures (including critical care time)  Labs Reviewed - No data to display No results found.      MDM    Visual acuity : right distance  20/20 (with glasses) ; L Distance: 20/25 (with glasses)   Erythema and edema of the left upper eyelid only.  Patient is otherwise well appearing.  EOM's intact but reproduces pain with lateral gaze.  No periorbital edema or erythema.  No hx of fever or recent illness.   Possible chalazion.  I have advised pt to try warm wet compresses, and  I will dispense Tobrex and ketorolac drops for home use.  She agrees to return here if sx's worsen , otherwise f/u with ophthmal on Monday.     Mert Dietrick L. Trisha Mangle, PA-C 04/18/13 2334

## 2013-04-17 NOTE — ED Notes (Signed)
Right eye pain/redness x 3 days.  States is getting worse and is affecting vision.

## 2013-04-20 ENCOUNTER — Ambulatory Visit: Payer: Self-pay | Admitting: Family Medicine

## 2013-04-26 NOTE — ED Provider Notes (Signed)
Medical screening examination/treatment/procedure(s) were performed by non-physician practitioner and as supervising physician I was immediately available for consultation/collaboration.   Graiden Henes L Hazim Treadway, MD 04/26/13 0255 

## 2013-06-06 ENCOUNTER — Emergency Department (HOSPITAL_COMMUNITY): Payer: 59

## 2013-06-06 ENCOUNTER — Encounter (HOSPITAL_COMMUNITY): Payer: Self-pay | Admitting: Emergency Medicine

## 2013-06-06 ENCOUNTER — Emergency Department (HOSPITAL_COMMUNITY)
Admission: EM | Admit: 2013-06-06 | Discharge: 2013-06-06 | Disposition: A | Discharge status: Home / Self Care | Payer: 59 | Attending: Emergency Medicine | Admitting: Emergency Medicine

## 2013-06-06 DIAGNOSIS — Z79899 Other long term (current) drug therapy: Secondary | ICD-10-CM | POA: Insufficient documentation

## 2013-06-06 DIAGNOSIS — Z90710 Acquired absence of both cervix and uterus: Secondary | ICD-10-CM | POA: Insufficient documentation

## 2013-06-06 DIAGNOSIS — R630 Anorexia: Secondary | ICD-10-CM | POA: Insufficient documentation

## 2013-06-06 DIAGNOSIS — R109 Unspecified abdominal pain: Secondary | ICD-10-CM

## 2013-06-06 DIAGNOSIS — F17200 Nicotine dependence, unspecified, uncomplicated: Secondary | ICD-10-CM | POA: Insufficient documentation

## 2013-06-06 DIAGNOSIS — Z862 Personal history of diseases of the blood and blood-forming organs and certain disorders involving the immune mechanism: Secondary | ICD-10-CM | POA: Insufficient documentation

## 2013-06-06 DIAGNOSIS — E669 Obesity, unspecified: Secondary | ICD-10-CM | POA: Insufficient documentation

## 2013-06-06 DIAGNOSIS — R197 Diarrhea, unspecified: Secondary | ICD-10-CM | POA: Insufficient documentation

## 2013-06-06 DIAGNOSIS — Z8742 Personal history of other diseases of the female genital tract: Secondary | ICD-10-CM | POA: Insufficient documentation

## 2013-06-06 DIAGNOSIS — F319 Bipolar disorder, unspecified: Secondary | ICD-10-CM | POA: Insufficient documentation

## 2013-06-06 DIAGNOSIS — Z8639 Personal history of other endocrine, nutritional and metabolic disease: Secondary | ICD-10-CM | POA: Insufficient documentation

## 2013-06-06 DIAGNOSIS — R1031 Right lower quadrant pain: Secondary | ICD-10-CM | POA: Insufficient documentation

## 2013-06-06 DIAGNOSIS — R110 Nausea: Secondary | ICD-10-CM | POA: Insufficient documentation

## 2013-06-06 LAB — LIPASE, BLOOD: Lipase: 36 U/L (ref 11–59)

## 2013-06-06 LAB — CBC WITH DIFFERENTIAL/PLATELET
Basophils Relative: 0 % (ref 0–1)
Eosinophils Absolute: 0.1 10*3/uL (ref 0.0–0.7)
Eosinophils Relative: 1 % (ref 0–5)
Hemoglobin: 14.6 g/dL (ref 12.0–15.0)
Lymphs Abs: 2.9 10*3/uL (ref 0.7–4.0)
MCH: 31.7 pg (ref 26.0–34.0)
MCHC: 33.5 g/dL (ref 30.0–36.0)
MCV: 94.8 fL (ref 78.0–100.0)
Monocytes Absolute: 0.9 10*3/uL (ref 0.1–1.0)
Monocytes Relative: 9 % (ref 3–12)
Neutrophils Relative %: 60 % (ref 43–77)

## 2013-06-06 LAB — COMPREHENSIVE METABOLIC PANEL
Albumin: 3.6 g/dL (ref 3.5–5.2)
BUN: 11 mg/dL (ref 6–23)
Calcium: 9.3 mg/dL (ref 8.4–10.5)
Creatinine, Ser: 0.75 mg/dL (ref 0.50–1.10)
GFR calc Af Amer: >90 mL/min (ref >90)
Glucose, Bld: 133 mg/dL — ABNORMAL HIGH (ref 70–99)
Total Protein: 7.9 g/dL (ref 6.0–8.3)

## 2013-06-06 LAB — URINALYSIS, ROUTINE W REFLEX MICROSCOPIC
Bilirubin Urine: NEGATIVE
Hgb urine dipstick: NEGATIVE
Ketones, ur: NEGATIVE mg/dL
Nitrite: NEGATIVE
pH: 5.5 (ref 5.0–8.0)

## 2013-06-06 MED ORDER — MORPHINE SULFATE 4 MG/ML IJ SOLN
4.0000 mg | Freq: Once | INTRAMUSCULAR | Status: AC
Start: 2013-06-06 — End: 2013-06-06
  Administered 2013-06-06: 4 mg via INTRAVENOUS
  Filled 2013-06-06: qty 1

## 2013-06-06 MED ORDER — HYDROCODONE-ACETAMINOPHEN 5-325 MG PO TABS: 1.0000 | ORAL_TABLET | ORAL | Status: DC | PRN

## 2013-06-06 MED ORDER — ONDANSETRON HCL 4 MG/2ML IJ SOLN
4.0000 mg | Freq: Once | INTRAMUSCULAR | Status: AC
  Administered 2013-06-06: 4 mg via INTRAVENOUS
  Filled 2013-06-06: qty 2

## 2013-06-06 MED ORDER — IOHEXOL 300 MG/ML  SOLN
100.0000 mL | Freq: Once | INTRAMUSCULAR | Status: AC | PRN
  Administered 2013-06-06: 100 mL via INTRAVENOUS

## 2013-06-06 MED ORDER — IOHEXOL 300 MG/ML  SOLN
50.0000 mL | Freq: Once | INTRAMUSCULAR | Status: AC | PRN
  Administered 2013-06-06: 50 mL via ORAL

## 2013-06-06 MED ORDER — ONDANSETRON 4 MG PO TBDP: ORAL_TABLET | ORAL | Status: DC

## 2013-06-06 MED ORDER — MORPHINE SULFATE 4 MG/ML IJ SOLN
8.0000 mg | Freq: Once | INTRAMUSCULAR | Status: AC
  Administered 2013-06-06: 8 mg via INTRAVENOUS
  Filled 2013-06-06: qty 2

## 2013-06-06 MED ORDER — SODIUM CHLORIDE 0.9 % IV BOLUS (SEPSIS)
1000.0000 mL | Freq: Once | INTRAVENOUS | Status: AC
  Administered 2013-06-06: 1000 mL via INTRAVENOUS

## 2013-06-06 NOTE — ED Notes (Signed)
Pt c/o rlq pain with n/d since yesterday.

## 2013-06-06 NOTE — ED Provider Notes (Signed)
CSN: [id number]     Arrival date & time 06/06/13  1819 History     First MD Initiated Contact with Patient 06/06/13 1828     Chief Complaint  Patient presents with  . Abdominal Pain  . Nausea   (Consider location/radiation/quality/duration/timing/severity/associated sxs/prior Treatment) HPI Pt with RLQ abd pain starting yesterday morning and gradually worsening. + nausea and anorexia. No fever or chills. + several loose stools. No urinary frequency, dysuria, or hematuria. No vaginal bleeding or d/c. Pt has had complete hysterectomy. No other surgeries. Pain is worse with deep breathing, palpation.  Past Medical History  Diagnosis Date  . Endometriosis   . Polycystic ovarian disease   . Obesity   . Bipolar disorder    Past Surgical History  Procedure Laterality Date  . Abdominal hysterectomy     No family history on file. History  Substance Use Topics  . Smoking status: Current Every Day Smoker -- 1.00 packs/day    Types: Cigarettes  . Smokeless tobacco: Not on file  . Alcohol Use: No   OB History   Grav Para Term Preterm Abortions TAB SAB Ect Mult Living                 Review of Systems  Constitutional: Positive for appetite change. Negative for fever and chills.  HENT: Negative for neck pain and neck stiffness.   Respiratory: Negative for cough and shortness of breath.   Cardiovascular: Negative for chest pain, palpitations and leg swelling.  Gastrointestinal: Positive for nausea, abdominal pain and diarrhea. Negative for vomiting, constipation, blood in stool and abdominal distention.  Genitourinary: Negative for dysuria, frequency, hematuria, flank pain, vaginal bleeding, vaginal discharge and pelvic pain.  Musculoskeletal: Negative for myalgias and back pain.  Skin: Negative for rash and wound.  Neurological: Negative for dizziness, weakness, light-headedness, numbness and headaches.  All other systems reviewed and are negative.    Allergies  Bee venom  Home  Medications   Current Outpatient Rx  Name  Route  Sig  Dispense  Refill  . clonazePAM (KLONOPIN) 1 MG tablet   Oral   Take 0.5 mg by mouth as needed for anxiety.         Marland Kitchen ibuprofen (ADVIL,MOTRIN) 200 MG tablet   Oral   Take 800 mg by mouth daily as needed. For pain/fever         . lamoTRIgine (LAMICTAL) 100 MG tablet   Oral   Take 100 mg by mouth 2 (two) times daily.           Marland Kitchen venlafaxine XR (EFFEXOR-XR) 75 MG 24 hr capsule   Oral   Take 75 mg by mouth daily.         Marland Kitchen HYDROcodone-acetaminophen (NORCO) 5-325 MG per tablet   Oral   Take 1 tablet by mouth every 4 (four) hours as needed for pain.   10 tablet   0   . ondansetron (ZOFRAN ODT) 4 MG disintegrating tablet      4mg  ODT q4 hours prn nausea/vomit   8 tablet   0    BP 151/91  Pulse 94  Temp(Src) 98.4 F (36.9 C) (Oral)  Resp 20  Ht 5\' 6"  (1.676 m)  Wt 340 lb (154.223 kg)  BMI 54.9 kg/m2  SpO2 97% Physical Exam  Nursing note and vitals reviewed. Constitutional: She is oriented to person, place, and time. She appears well-developed and well-nourished. No distress.  HENT:  Head: Normocephalic and atraumatic.  Mouth/Throat: Oropharynx is clear and  moist.  Eyes: EOM are normal. Pupils are equal, round, and reactive to light.  Neck: Normal range of motion. Neck supple.  Cardiovascular: Normal rate and regular rhythm.   Pulmonary/Chest: Effort normal and breath sounds normal. No respiratory distress. She has no wheezes. She has no rales. She exhibits no tenderness.  Abdominal: Soft. Bowel sounds are normal. She exhibits no distension and no mass. There is tenderness (focal RLQ TTP. No rovsing sign). There is no rebound and no guarding.  Musculoskeletal: Normal range of motion. She exhibits no edema and no tenderness.  No CVAT  Neurological: She is alert and oriented to person, place, and time.  Skin: Skin is warm and dry. No rash noted. No erythema.  Psychiatric: She has a normal mood and affect. Her  behavior is normal.    ED Course   Procedures (including critical care time)  Labs Reviewed  URINALYSIS, ROUTINE W REFLEX MICROSCOPIC - Abnormal; Notable for the following:    Specific Gravity, Urine >1.030 (*)    All other components within normal limits  COMPREHENSIVE METABOLIC PANEL - Abnormal; Notable for the following:    Glucose, Bld 133 (*)    Total Bilirubin 0.2 (*)    All other components within normal limits  CBC WITH DIFFERENTIAL  LIPASE, BLOOD   Ct Abdomen Pelvis W Contrast  06/06/2013   *RADIOLOGY REPORT*  Clinical Data: Right lower quadrant pain, endometriosis, PCOS, status post hysterectomy  CT ABDOMEN AND PELVIS WITH CONTRAST  Technique:  Multidetector CT imaging of the abdomen and pelvis was performed following the standard protocol during bolus administration of intravenous contrast.  Contrast: 100 ml Omnipaque-300 IV  Comparison: 08/12/2010  Findings: Lung bases are clear.  Moderate hepatic steatosis.  Spleen, pancreas, and right adrenal gland are within normal limits.  2.9 x 2.3 cm left adrenal adenoma.  Gallbladder is unremarkable.  No intrahepatic or extrahepatic ductal dilatation.  Kidneys are within normal limits.  No hydronephrosis.  No evidence of bowel obstruction. Mildly prominent loops of small bowel in the left upper abdomen without focal transition.  Colon is not decompressed.  Normal appendix.  No evidence of abdominal aortic aneurysm.  No abdominopelvic ascites.  No suspicious abdominopelvic lymphadenopathy.  No adnexal masses.  Bladder is within normal limits.  Mild degenerative changes at L5-S1.  IMPRESSION: Normal appendix.  No evidence of bowel obstruction.  Moderate hepatic steatosis.  2.9 cm left adrenal adenoma.  No CT findings to account for the patient's right lower quadrant abdominal pain.   Original Report Authenticated By: Charline Bills, M.D.   1. Abdominal pain     MDM  Pt symptoms are improved. No cause for pt symptoms found. Question acute  gastroenteritis. Pt given return precautions.   Loren Racer, MD 06/06/13 2037

## 2013-08-11 ENCOUNTER — Emergency Department (HOSPITAL_COMMUNITY)
Admission: EM | Admit: 2013-08-11 | Discharge: 2013-08-11 | Disposition: A | Discharge status: Home / Self Care | Payer: 59 | Attending: Emergency Medicine | Admitting: Emergency Medicine

## 2013-08-11 ENCOUNTER — Encounter (HOSPITAL_COMMUNITY): Payer: Self-pay | Admitting: *Deleted

## 2013-08-11 DIAGNOSIS — M79621 Pain in right upper arm: Secondary | ICD-10-CM

## 2013-08-11 DIAGNOSIS — F17200 Nicotine dependence, unspecified, uncomplicated: Secondary | ICD-10-CM | POA: Insufficient documentation

## 2013-08-11 DIAGNOSIS — E669 Obesity, unspecified: Secondary | ICD-10-CM | POA: Insufficient documentation

## 2013-08-11 DIAGNOSIS — Z8742 Personal history of other diseases of the female genital tract: Secondary | ICD-10-CM | POA: Insufficient documentation

## 2013-08-11 DIAGNOSIS — L089 Local infection of the skin and subcutaneous tissue, unspecified: Secondary | ICD-10-CM

## 2013-08-11 DIAGNOSIS — IMO0002 Reserved for concepts with insufficient information to code with codable children: Secondary | ICD-10-CM | POA: Insufficient documentation

## 2013-08-11 DIAGNOSIS — Z79899 Other long term (current) drug therapy: Secondary | ICD-10-CM | POA: Insufficient documentation

## 2013-08-11 DIAGNOSIS — F319 Bipolar disorder, unspecified: Secondary | ICD-10-CM | POA: Insufficient documentation

## 2013-08-11 MED ORDER — SULFAMETHOXAZOLE-TMP DS 800-160 MG PO TABS: 1.0000 | ORAL_TABLET | Freq: Two times a day (BID) | ORAL | Status: DC

## 2013-08-11 MED ORDER — HYDROCODONE-ACETAMINOPHEN 5-325 MG PO TABS: 1.0000 | ORAL_TABLET | ORAL | Status: DC | PRN

## 2013-08-11 NOTE — ED Provider Notes (Signed)
CSN: [id number]     Arrival date & time 08/11/13  1706 History   First MD Initiated Contact with Patient 08/11/13 1736     Chief Complaint  Patient presents with  . ?abcess    (Consider location/radiation/quality/duration/timing/severity/associated sxs/prior Treatment) Patient is a 37 y.o. female presenting with abscess. The history is provided by the patient.  Abscess Location:  Shoulder/arm Shoulder/arm abscess location:  R axilla Size:  2 mm Abscess quality: draining, painful and warmth   Red streaking: no   Duration:  1 month Progression:  Worsening Pain details:    Quality:  Throbbing and shooting   Severity:  Moderate   Timing:  Constant   Progression:  Worsening Chronicity:  New Relieved by:  Nothing Worsened by:  Nothing tried Associated symptoms: no anorexia, no fatigue, no fever, no headaches, no nausea and no vomiting   Risk factors: no hx of MRSA and no prior abscess     Past Medical History  Diagnosis Date  . Endometriosis   . Polycystic ovarian disease   . Obesity   . Bipolar disorder    Past Surgical History  Procedure Laterality Date  . Abdominal hysterectomy     History reviewed. No pertinent family history. History  Substance Use Topics  . Smoking status: Current Every Day Smoker -- 1.00 packs/day    Types: Cigarettes  . Smokeless tobacco: Not on file  . Alcohol Use: No   OB History   Grav Para Term Preterm Abortions TAB SAB Ect Mult Living                 Review of Systems  Constitutional: Negative for fever and fatigue.  HENT: Negative for sore throat and neck pain.   Gastrointestinal: Negative for nausea, vomiting, abdominal pain and anorexia.  Genitourinary: Negative for frequency.  Musculoskeletal:       Rt axilla pain and draining area.  Skin: Positive for wound.  Neurological: Negative for weakness and headaches.  Psychiatric/Behavioral: Nervous/anxious: hx of Bipolar.     Allergies  Bee venom  Home Medications   Current  Outpatient Rx  Name  Route  Sig  Dispense  Refill  . lamoTRIgine (LAMICTAL) 100 MG tablet   Oral   Take 100 mg by mouth 2 (two) times daily.           Marland Kitchen venlafaxine XR (EFFEXOR-XR) 75 MG 24 hr capsule   Oral   Take 75 mg by mouth daily.         Marland Kitchen ibuprofen (ADVIL,MOTRIN) 200 MG tablet   Oral   Take 800 mg by mouth daily as needed. For pain/fever          BP 148/91  Pulse 93  Temp(Src) 98.4 F (36.9 C) (Oral)  Resp 22  Ht 5\' 7"  (1.702 m)  Wt 340 lb (154.223 kg)  BMI 53.24 kg/m2  SpO2 98% Physical Exam  Nursing note and vitals reviewed. Constitutional: She is oriented to person, place, and time. She appears well-developed and well-nourished. No distress.  HENT:  Head: Normocephalic and atraumatic.  Eyes: Conjunctivae and EOM are normal.  Neck: Neck supple.  Cardiovascular: Normal rate.   Pulmonary/Chest: Effort normal.  Musculoskeletal:  Right axilla tender with palpation. Tiny area that is open and draining small amount of bloody fluid. No mass palpated. Pain of right axilla with range of motion of the right arm.   Neurological: She is alert and oriented to person, place, and time. No cranial nerve deficit.  Skin: Skin is  warm and dry.  Psychiatric: She has a normal mood and affect. Her behavior is normal.    ED Course  Procedures  MDM  37 y.o. female with pain and area of drainage from right axilla x 1 month. Will treat with antibiotics and she will apply warm wet compresses and follow up with General Surgery. She will return here as needed. Patient stable for discharge home without any immediate complications.    Medication List    TAKE these medications       HYDROcodone-acetaminophen 5-325 MG per tablet  Commonly known as:  NORCO/VICODIN  Take 1 tablet by mouth every 4 (four) hours as needed.     sulfamethoxazole-trimethoprim 800-160 MG per tablet  Commonly known as:  BACTRIM DS  Take 1 tablet by mouth 2 (two) times daily.      ASK your doctor about  these medications       ibuprofen 200 MG tablet  Commonly known as:  ADVIL,MOTRIN  Take 800 mg by mouth daily as needed. For pain/fever     lamoTRIgine 100 MG tablet  Commonly known as:  LAMICTAL  Take 100 mg by mouth 2 (two) times daily.     venlafaxine XR 75 MG 24 hr capsule  Commonly known as:  EFFEXOR-XR  Take 75 mg by mouth daily.           [facility] Orlene Och, [place] 08/11/13 [phone]

## 2013-08-11 NOTE — Discharge Instructions (Signed)
Apply warm wet compresses to the area. Take the antibiotic and pain medication as directed. Do not take the narcotic if you are driving or at work as it will make you sleepy. Follow up with Dr. Lovell Sheehan.

## 2013-08-11 NOTE — ED Provider Notes (Signed)
Medical screening examination/treatment/procedure(s) were performed by non-physician practitioner and as supervising physician I was immediately available for consultation/collaboration.  Flint Melter, MD 08/11/13 [phone]

## 2013-08-11 NOTE — ED Notes (Signed)
Anterior axilla small hole which is tender and has been draining bloody fluid x 1 month.  Also reports pain w/radiation distally from axilla.

## 2013-08-13 ENCOUNTER — Emergency Department (HOSPITAL_COMMUNITY): Payer: 59

## 2013-08-13 ENCOUNTER — Encounter (HOSPITAL_COMMUNITY): Payer: Self-pay | Admitting: Emergency Medicine

## 2013-08-13 ENCOUNTER — Emergency Department (HOSPITAL_COMMUNITY)
Admission: EM | Admit: 2013-08-13 | Discharge: 2013-08-13 | Disposition: A | Discharge status: Home / Self Care | Payer: 59 | Attending: Emergency Medicine | Admitting: Emergency Medicine

## 2013-08-13 DIAGNOSIS — E669 Obesity, unspecified: Secondary | ICD-10-CM | POA: Insufficient documentation

## 2013-08-13 DIAGNOSIS — R0789 Other chest pain: Secondary | ICD-10-CM

## 2013-08-13 DIAGNOSIS — Z8742 Personal history of other diseases of the female genital tract: Secondary | ICD-10-CM | POA: Insufficient documentation

## 2013-08-13 DIAGNOSIS — S21101D Unspecified open wound of right front wall of thorax without penetration into thoracic cavity, subsequent encounter: Secondary | ICD-10-CM

## 2013-08-13 DIAGNOSIS — R071 Chest pain on breathing: Secondary | ICD-10-CM | POA: Insufficient documentation

## 2013-08-13 DIAGNOSIS — F17200 Nicotine dependence, unspecified, uncomplicated: Secondary | ICD-10-CM | POA: Insufficient documentation

## 2013-08-13 DIAGNOSIS — L02219 Cutaneous abscess of trunk, unspecified: Secondary | ICD-10-CM | POA: Insufficient documentation

## 2013-08-13 DIAGNOSIS — Z79899 Other long term (current) drug therapy: Secondary | ICD-10-CM | POA: Insufficient documentation

## 2013-08-13 DIAGNOSIS — F319 Bipolar disorder, unspecified: Secondary | ICD-10-CM | POA: Insufficient documentation

## 2013-08-13 LAB — BASIC METABOLIC PANEL
BUN: 10 mg/dL (ref 6–23)
Chloride: 99 mEq/L (ref 96–112)
Creatinine, Ser: 0.68 mg/dL (ref 0.50–1.10)
GFR calc Af Amer: >90 mL/min (ref >90)
Glucose, Bld: 154 mg/dL — ABNORMAL HIGH (ref 70–99)

## 2013-08-13 LAB — CBC WITH DIFFERENTIAL/PLATELET
Basophils Relative: 0 % (ref 0–1)
HCT: 43.6 % (ref 36.0–46.0)
Hemoglobin: 14.4 g/dL (ref 12.0–15.0)
Lymphs Abs: 2.6 10*3/uL (ref 0.7–4.0)
MCH: 31 pg (ref 26.0–34.0)
MCHC: 33 g/dL (ref 30.0–36.0)
Monocytes Absolute: 0.9 10*3/uL (ref 0.1–1.0)
Monocytes Relative: 9 % (ref 3–12)
Neutro Abs: 5.4 10*3/uL (ref 1.7–7.7)
RBC: 4.64 MIL/uL (ref 3.87–5.11)

## 2013-08-13 LAB — URINALYSIS, ROUTINE W REFLEX MICROSCOPIC
Bilirubin Urine: NEGATIVE
Glucose, UA: NEGATIVE mg/dL
Hgb urine dipstick: NEGATIVE
Ketones, ur: NEGATIVE mg/dL
Leukocytes, UA: NEGATIVE
Nitrite: NEGATIVE
Protein, ur: NEGATIVE mg/dL
Specific Gravity, Urine: >1.03 — ABNORMAL HIGH (ref 1.005–1.030)
Urobilinogen, UA: 0.2 mg/dL (ref 0.0–1.0)
pH: 5.5 (ref 5.0–8.0)

## 2013-08-13 MED ORDER — OXYCODONE-ACETAMINOPHEN 5-325 MG PO TABS
2.0000 | ORAL_TABLET | Freq: Once | ORAL | Status: AC
  Administered 2013-08-13: 2 via ORAL
  Filled 2013-08-13: qty 2

## 2013-08-13 MED ORDER — IOHEXOL 350 MG/ML SOLN
120.0000 mL | Freq: Once | INTRAVENOUS | Status: AC | PRN
  Administered 2013-08-13: 120 mL via INTRAVENOUS

## 2013-08-13 MED ORDER — HYDROCODONE-ACETAMINOPHEN 5-325 MG PO TABS: ORAL_TABLET | ORAL | Status: DC

## 2013-08-13 MED ORDER — MORPHINE SULFATE 4 MG/ML IJ SOLN
4.0000 mg | INTRAMUSCULAR | Status: DC | PRN
  Administered 2013-08-13: 4 mg via INTRAVENOUS
  Filled 2013-08-13: qty 1

## 2013-08-13 MED ORDER — CEPHALEXIN 500 MG PO CAPS: 500.0000 mg | ORAL_CAPSULE | Freq: Four times a day (QID) | ORAL | Status: DC

## 2013-08-13 NOTE — ED Provider Notes (Signed)
CSN: [id number]     Arrival date & time 08/13/13  1826 History   First MD Initiated Contact with Patient 08/13/13 1843     Chief Complaint  Patient presents with  . Shortness of Breath  . Open Wound    HPI Pt was seen at 1905.  Per pt, c/o gradual onset and persistence of constant "draining wound" to her right chest wall for the past 1 month. Pt was evaluated in the ED for same 2 days ago, rx abx. States she has a f/u appt with the Surgeon Dr. Lovell Sheehan in 5 days. Pt states the area feels "heavy" which makes her feel "short of breath." States wound area is more "sore" to touch since she started taking the abx yesterday. Denies fevers, no rash, no CP/palpitations, no cough, no injury.   Past Medical History  Diagnosis Date  . Endometriosis   . Polycystic ovarian disease   . Obesity   . Bipolar disorder    Past Surgical History  Procedure Laterality Date  . Abdominal hysterectomy      History  Substance Use Topics  . Smoking status: Current Every Day Smoker -- 1.00 packs/day    Types: Cigarettes  . Smokeless tobacco: Not on file  . Alcohol Use: No    Review of Systems ROS: Statement: All systems negative except as marked or noted in the HPI; Constitutional: Negative for fever and chills. ; ; Eyes: Negative for eye pain, redness and discharge. ; ; ENMT: Negative for ear pain, hoarseness, nasal congestion, sinus pressure and sore throat. ; ; Cardiovascular: Negative for chest pain, palpitations, diaphoresis, and peripheral edema. ; ; Respiratory: +SOB. Negative for cough, wheezing and stridor. ; ; Gastrointestinal: Negative for nausea, vomiting, diarrhea, abdominal pain, blood in stool, hematemesis, jaundice and rectal bleeding. . ; ; Genitourinary: Negative for dysuria, flank pain and hematuria. ; ; Musculoskeletal: Negative for back pain and neck pain. Negative for swelling and trauma.; ; Skin: +wound. Negative for pruritus, rash, abrasions, blisters, bruising and skin lesion.; ; Neuro:  Negative for headache, lightheadedness and neck stiffness. Negative for weakness, altered level of consciousness , altered mental status, extremity weakness, paresthesias, involuntary movement, seizure and syncope.     Allergies  Bee venom  Home Medications   Current Outpatient Rx  Name  Route  Sig  Dispense  Refill  . HYDROcodone-acetaminophen (NORCO/VICODIN) 5-325 MG per tablet   Oral   Take 1 tablet by mouth every 4 (four) hours as needed.   15 tablet   0   . ibuprofen (ADVIL,MOTRIN) 200 MG tablet   Oral   Take 800 mg by mouth daily as needed. For pain/fever         . lamoTRIgine (LAMICTAL) 100 MG tablet   Oral   Take 100 mg by mouth 2 (two) times daily.           Marland Kitchen sulfamethoxazole-trimethoprim (BACTRIM DS) 800-160 MG per tablet   Oral   Take 1 tablet by mouth 2 (two) times daily.   20 tablet   0   . venlafaxine XR (EFFEXOR-XR) 75 MG 24 hr capsule   Oral   Take 75 mg by mouth daily.          BP 150/66  Pulse 100  Temp(Src) 98.1 F (36.7 C) (Oral)  Resp 18  Ht 5\' 6"  (1.676 m)  Wt 340 lb (154.223 kg)  BMI 54.9 kg/m2  SpO2 96% Physical Exam 1910: Physical examination:  Nursing notes reviewed; Vital signs and O2  SAT reviewed;  Constitutional: Well developed, Well nourished, Well hydrated, In no acute distress; Head:  Normocephalic, atraumatic; Eyes: EOMI, PERRL, No scleral icterus; ENMT: Mouth and pharynx normal, Mucous membranes moist; Neck: Supple, Full range of motion, No lymphadenopathy; Cardiovascular: Regular rate and rhythm, No murmur, rub, or gallop; Respiratory: Breath sounds clear & equal bilaterally, No rales, rhonchi, wheezes. Speaking full sentences with ease, Normal respiratory effort/excursion; Chest: +right lateral chest wall with approx 2mm diameter open wound without soft tissue crepitus, no ecchymosis, no edema, no surrounding erythema, no streaking; surrounding area is tender to palp with scant purulent discharge. No discharge is able to be  manually expressed. No palp fluctuance. No palp right breast lump. No nipple discharge. No right axillary lymphadenopathy. No deformity. Movement normal; Abdomen: Soft, Nontender, Nondistended, Normal bowel sounds; Genitourinary: No CVA tenderness; Extremities: Pulses normal, No tenderness, No edema, No calf edema or asymmetry.; Neuro: AA&Ox3, Major CN grossly intact.  Speech clear. No gross focal motor or sensory deficits in extremities.; Skin: Color normal, Warm, Dry.   ED Course  Procedures     MDM  MDM Reviewed: previous chart, nursing note and vitals Reviewed previous: labs Interpretation: labs, x-ray, CT scan and ECG      Date: 08/13/2013  Rate: 93  Rhythm: normal sinus rhythm  QRS Axis: normal  Intervals: normal  ST/T Wave abnormalities: normal  Conduction Disutrbances:none  Narrative Interpretation:   Old EKG Reviewed: none available.   Results for orders placed during the hospital encounter of 08/13/13  CBC WITH DIFFERENTIAL      Result Value Range   WBC 9.1  4.0 - 10.5 K/uL   RBC 4.64  3.87 - 5.11 MIL/uL   Hemoglobin 14.4  12.0 - 15.0 g/dL   HCT 16.1  09.6 - 04.5 %   MCV 94.0  78.0 - 100.0 fL   MCH 31.0  26.0 - 34.0 pg   MCHC 33.0  30.0 - 36.0 g/dL   RDW 40.9  81.1 - 91.4 %   Platelets 246  150 - 400 K/uL   Neutrophils Relative % 60  43 - 77 %   Neutro Abs 5.4  1.7 - 7.7 K/uL   Lymphocytes Relative 29  12 - 46 %   Lymphs Abs 2.6  0.7 - 4.0 K/uL   Monocytes Relative 9  3 - 12 %   Monocytes Absolute 0.9  0.1 - 1.0 K/uL   Eosinophils Relative 2  0 - 5 %   Eosinophils Absolute 0.2  0.0 - 0.7 K/uL   Basophils Relative 0  0 - 1 %   Basophils Absolute 0.0  0.0 - 0.1 K/uL  BASIC METABOLIC PANEL      Result Value Range   Sodium 136  135 - 145 mEq/L   Potassium 4.0  3.5 - 5.1 mEq/L   Chloride 99  96 - 112 mEq/L   CO2 28  19 - 32 mEq/L   Glucose, Bld 154 (*) 70 - 99 mg/dL   BUN 10  6 - 23 mg/dL   Creatinine, Ser 7.82  0.50 - 1.10 mg/dL   Calcium 9.5  8.4 -  95.6 mg/dL   GFR calc non Af Amer >90  >90 mL/min   GFR calc Af Amer >90  >90 mL/min  URINALYSIS, ROUTINE W REFLEX MICROSCOPIC      Result Value Range   Color, Urine YELLOW  YELLOW   APPearance CLEAR  CLEAR   Specific Gravity, Urine >1.030 (*) 1.005 - 1.030  pH 5.5  5.0 - 8.0   Glucose, UA NEGATIVE  NEGATIVE mg/dL   Hgb urine dipstick NEGATIVE  NEGATIVE   Bilirubin Urine NEGATIVE  NEGATIVE   Ketones, ur NEGATIVE  NEGATIVE mg/dL   Protein, ur NEGATIVE  NEGATIVE mg/dL   Urobilinogen, UA 0.2  0.0 - 1.0 mg/dL   Nitrite NEGATIVE  NEGATIVE   Leukocytes, UA NEGATIVE  NEGATIVE  LACTIC ACID, PLASMA      Result Value Range   Lactic Acid, Venous 1.3  0.5 - 2.2 mmol/L   Dg Chest 2 View 08/13/2013   CLINICAL DATA:  Short of breath.  EXAM: CHEST  2 VIEW  COMPARISON:  01/17/2013  FINDINGS: The heart size and mediastinal contours are within normal limits. Both lungs are clear. The visualized skeletal structures are unremarkable.  IMPRESSION: No active cardiopulmonary disease.   Electronically Signed   By: Amie Portland M.D.   On: 08/13/2013 20:25   Ct Angio Chest Pe W/cm &/or Wo Cm 08/13/2013   CLINICAL DATA:  Short of breath, draining the right chest wall wound  EXAM: CT ANGIOGRAPHY CHEST WITH CONTRAST  TECHNIQUE: Multidetector CT imaging of the chest was performed using the standard protocol during bolus administration of intravenous contrast. Multiplanar CT image reconstructions including MIPs were obtained to evaluate the vascular anatomy.  CONTRAST:  OMNIPAQUE IOHEXOL 350 MG/ML SOLN  COMPARISON:  Chest x-ray obtained earlier today at 19:54 p.m.; prior CT abdomen/pelvis 06/06/2013  FINDINGS: Mediastinum: Unremarkable CT appearance of the thyroid gland. No suspicious mediastinal or hilar adenopathy. No soft tissue mediastinal mass. The thoracic esophagus is unremarkable.  Heart/Vascular: Very poor opacification of the pulmonary artery within greater density of contrast material in the aorta and  contrast is still entering the superior vena cava. Constellation of findings is consistent with transient physiologic interruption of contrast material. Evaluation of the pulmonary arteries is essentially nondiagnostic beyond the main pulmonary artery. The heart is within normal limits for size. No pericardial effusion. Conventional 3 vessel arch anatomy. No aneurysmal dilatation.  Lungs/Pleura: No pleural effusion. The lungs are well expanded and clear. No significant atelectasis. Isolated bleb is centrally within the right lower lobe. Small subpleural lymph node in the periphery of the right lower lobe.  Bones/Soft Tissues: No acute fracture or aggressive appearing lytic or blastic osseous lesion.  Upper Abdomen: Probable hepatic steatosis. 2.6 cm left adrenal nodule is incompletely evaluated on the present study but was previously identified as an adenoma.  Review of the MIP images confirms the above findings.  IMPRESSION: 1. Essentially nondiagnostic for pulmonary embolus secondary to transient physiologic interruption of the contrast bolus. 2. Negative for pneumonia or other acute cardiopulmonary process. The lungs are clear. 3. Stable left adrenal adenoma. 4. Hepatic steatosis.   Electronically Signed   By: Malachy Moan M.D.   On: 08/13/2013 21:30    2200:  T/C to Rads MD above: states no evidence of soft tissue infection, no abscess, no right axillary lymphadenopathy; poor timing of bolus to r/o PE though lungs appear clear. Doubt PE as cause for SOB, as pt states "the pain makes me SOB," Sats 98% R/A, and pt is low risk [place]. Pt remains afebrile, WBC and lactic acid normal. Will have pt continue to take abx, rx pain meds, encourage General Surgeon f/u.  Dx and testing d/w pt.  Questions answered.  Verb understanding, agreeable to d/c home with outpt f/u.   Laray Anger, DO 08/16/13 1101

## 2013-08-13 NOTE — ED Notes (Signed)
Pt has an abscess to rt breast with appt to see a surgeon on Tuesday.  Today increased pain with sob.  No NVD.  Taking an antibiotic.

## 2013-08-13 NOTE — ED Notes (Signed)
Fully alert, anxious, worried that the abscess on right chest wall is cancer. No resp distress noted.

## 2013-08-13 NOTE — ED Notes (Signed)
Patient has wound to right chest, midway between breast and axilla. C/o heaviness to chest area and shortness of breath. Denies chest pain. Appears to become short of breath when wound is touched/movement. Redness noted to wound with purulent discharge (scant amount). Patient reports no drainage or redness two days ago during [facility], NP assessment. Patient has contacted surgeon as instructed and has appointment Tuesday.

## 2013-10-06 ENCOUNTER — Encounter (HOSPITAL_COMMUNITY): Payer: Self-pay | Admitting: Emergency Medicine

## 2013-10-06 ENCOUNTER — Emergency Department (INDEPENDENT_AMBULATORY_CARE_PROVIDER_SITE_OTHER)
Admission: EM | Admit: 2013-10-06 | Discharge: 2013-10-06 | Disposition: A | Discharge status: Home / Self Care | Payer: 59 | Source: Home / Self Care | Attending: Family Medicine | Admitting: Family Medicine

## 2013-10-06 DIAGNOSIS — J040 Acute laryngitis: Secondary | ICD-10-CM

## 2013-10-06 DIAGNOSIS — K5289 Other specified noninfective gastroenteritis and colitis: Secondary | ICD-10-CM

## 2013-10-06 DIAGNOSIS — K529 Noninfective gastroenteritis and colitis, unspecified: Secondary | ICD-10-CM

## 2013-10-06 NOTE — ED Notes (Signed)
Pt c/o laryngitis onset 2 days Reports she had abd pain, diarrhea, and vomiting about 4 days ago Still having some diarrhea and BA She is alert w/no signs of acute distress.

## 2013-10-06 NOTE — ED Provider Notes (Signed)
CSN: [id number]     Arrival date & time 10/06/13  1616 History   First MD Initiated Contact with Patient 10/06/13 1720     Chief Complaint  Patient presents with  . Laryngitis   (Consider location/radiation/quality/duration/timing/severity/associated sxs/prior Treatment) HPI Comments: 18f presents requesting work note for being sick this morning.  For the past 4 days she has had nausea, vomiting, diarrhea. This began after she ate some sushi so she assumes that is to blame. The last time she vomited was at a half ago and the diarrhea is slowing down. For the last 2 days, she has had very hoarse voice and laryngitis. She denies any upper respiratory symptoms or any symptoms of sinus pressure, sore throat. 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   Past Medical History  Diagnosis Date  . Endometriosis   . Polycystic ovarian disease   . Obesity   . Bipolar disorder    Past Surgical History  Procedure Laterality Date  . Abdominal hysterectomy     No family history on file. History  Substance Use Topics  . Smoking status: Current Every Day Smoker -- 1.00 packs/day    Types: Cigarettes  . Smokeless tobacco: Not on file  . Alcohol Use: No   OB History   Grav Para Term Preterm Abortions TAB SAB Ect Mult Living                 Review of Systems  Constitutional: Negative for fever and chills.  HENT: Positive for voice change.   Eyes: Negative for visual disturbance.  Respiratory: Negative for cough and shortness of breath.   Cardiovascular: Negative for chest pain, palpitations and leg swelling.  Gastrointestinal: Positive for nausea, vomiting and diarrhea. Negative for abdominal pain.  Endocrine: Negative for polydipsia and polyuria.  Genitourinary: Negative for dysuria, urgency and frequency.  Musculoskeletal: Negative for arthralgias and myalgias.  Skin: Negative for rash.  Neurological: Negative for dizziness, weakness and  light-headedness.    Allergies  Bee venom  Home Medications   Current Outpatient Rx  Name  Route  Sig  Dispense  Refill  . lamoTRIgine (LAMICTAL) 100 MG tablet   Oral   Take 100 mg by mouth 2 (two) times daily.           Marland Kitchen venlafaxine XR (EFFEXOR-XR) 75 MG 24 hr capsule   Oral   Take 75 mg by mouth daily.         . cephALEXin (KEFLEX) 500 MG capsule   Oral   Take 1 capsule (500 mg total) by mouth 4 (four) times daily.   40 capsule   0   . HYDROcodone-acetaminophen (NORCO/VICODIN) 5-325 MG per tablet   Oral   Take 1 tablet by mouth every 4 (four) hours as needed.   15 tablet   0   . HYDROcodone-acetaminophen (NORCO/VICODIN) 5-325 MG per tablet      1 or 2 tabs PO q6 hours prn pain   20 tablet   0   . ibuprofen (ADVIL,MOTRIN) 200 MG tablet   Oral   Take 800 mg by mouth daily as needed. For pain/fever         . sulfamethoxazole-trimethoprim (BACTRIM DS) 800-160 MG per tablet   Oral   Take 1 tablet by mouth 2 (two) times daily.   20 tablet   0    BP 151/98  Pulse 96  Temp(Src) 97.5 F (36.4 C) (Oral)  Resp 16  SpO2 99% Physical Exam  Nursing note and vitals reviewed. Constitutional: She is oriented to person, place, and time. Vital signs are normal. She appears well-developed and well-nourished. No distress.  Obese habitus  HENT:  Head: Normocephalic and atraumatic.  Neck: Normal range of motion.  Cardiovascular: Normal rate, regular rhythm and normal heart sounds.   Pulmonary/Chest: Breath sounds normal. No respiratory distress.  Abdominal: Soft. There is no tenderness.  Lymphadenopathy:    She has no cervical adenopathy.  Neurological: She is alert and oriented to person, place, and time. She has normal strength. Coordination normal.  Skin: Skin is warm and dry. No rash noted. She is not diaphoretic.  Psychiatric: She has a normal mood and affect. Judgment normal.    ED Course  Procedures (including critical care time) Labs Review Labs  Reviewed - No data to display Imaging Review No results found.    MDM   1. Gastroenteritis   2. Laryngitis    Work note provided.  Voice rest.  Declines any treatment for GI symptoms bc they are resolving on their own.  F/u if any worsening     Graylon Good, PA-C 10/06/13 1748

## 2013-10-07 NOTE — ED Provider Notes (Signed)
Medical screening examination/treatment/procedure(s) were performed by a resident physician or non-physician practitioner and as the supervising physician I was immediately available for consultation/collaboration.  Clementeen Graham, MD    Rodolph Bong, MD 10/07/13 [phone]

## 2014-03-15 ENCOUNTER — Encounter (HOSPITAL_COMMUNITY): Payer: Self-pay | Admitting: Emergency Medicine

## 2014-03-15 ENCOUNTER — Emergency Department (HOSPITAL_COMMUNITY)
Admission: EM | Admit: 2014-03-15 | Discharge: 2014-03-15 | Disposition: A | Discharge status: Home / Self Care | Payer: 59 | Attending: Emergency Medicine | Admitting: Emergency Medicine

## 2014-03-15 DIAGNOSIS — J029 Acute pharyngitis, unspecified: Secondary | ICD-10-CM

## 2014-03-15 DIAGNOSIS — Z79899 Other long term (current) drug therapy: Secondary | ICD-10-CM | POA: Insufficient documentation

## 2014-03-15 DIAGNOSIS — Z8742 Personal history of other diseases of the female genital tract: Secondary | ICD-10-CM | POA: Insufficient documentation

## 2014-03-15 DIAGNOSIS — R42 Dizziness and giddiness: Secondary | ICD-10-CM | POA: Insufficient documentation

## 2014-03-15 DIAGNOSIS — E669 Obesity, unspecified: Secondary | ICD-10-CM | POA: Insufficient documentation

## 2014-03-15 DIAGNOSIS — H9209 Otalgia, unspecified ear: Secondary | ICD-10-CM | POA: Insufficient documentation

## 2014-03-15 DIAGNOSIS — F319 Bipolar disorder, unspecified: Secondary | ICD-10-CM | POA: Insufficient documentation

## 2014-03-15 DIAGNOSIS — Z792 Long term (current) use of antibiotics: Secondary | ICD-10-CM | POA: Insufficient documentation

## 2014-03-15 DIAGNOSIS — F17200 Nicotine dependence, unspecified, uncomplicated: Secondary | ICD-10-CM | POA: Insufficient documentation

## 2014-03-15 DIAGNOSIS — Z791 Long term (current) use of non-steroidal anti-inflammatories (NSAID): Secondary | ICD-10-CM | POA: Insufficient documentation

## 2014-03-15 MED ORDER — HYDROCODONE-ACETAMINOPHEN 5-325 MG PO TABS: 1.0000 | ORAL_TABLET | ORAL | Status: DC | PRN

## 2014-03-15 MED ORDER — AMOXICILLIN 500 MG PO CAPS: 500.0000 mg | ORAL_CAPSULE | Freq: Three times a day (TID) | ORAL | Status: DC

## 2014-03-15 MED ORDER — IBUPROFEN 800 MG PO TABS: 800.0000 mg | ORAL_TABLET | Freq: Three times a day (TID) | ORAL | Status: DC

## 2014-03-15 NOTE — ED Provider Notes (Signed)
CSN: [id number]     Arrival date & time 03/15/14  1724 History  This chart was scribed for non-physician practitioner, Ivery QualeHobson Oluwatomisin Hustead, PA-C,working with Donnetta HutchingBrian Cook, MD, by Karle PlumberJennifer Tensley, ED Scribe.  This patient was seen in room APFT22/APFT22 and the patient's care was started at 6:57 PM.  Chief Complaint  Patient presents with  . Sore Throat   The history is provided by the patient. No language interpreter was used.   HPI Comments:  Amy ChandlerJennifer Nowak is a 38 y.o. obese female who presents to the Emergency Department complaining of severe left-sided throat pain and left ear pain that started three days ago. Pt states she has had associated fever Tmax 100.2. She reports hoarseness and dizziness. She denies taking anything for her symptoms. She denies sinus pressure, sneezing, rhinorrhea, or cough. She denies any surgery to her throat.   Past Medical History  Diagnosis Date  . Endometriosis   . Polycystic ovarian disease   . Obesity   . Bipolar disorder    Past Surgical History  Procedure Laterality Date  . Abdominal hysterectomy     History reviewed. No pertinent family history. History  Substance Use Topics  . Smoking status: Current Every Day Smoker -- 1.00 packs/day    Types: Cigarettes  . Smokeless tobacco: Not on file  . Alcohol Use: No   OB History   Grav Para Term Preterm Abortions TAB SAB Ect Mult Living                 Review of Systems  Constitutional: Negative for fever.  HENT: Positive for ear pain (left), sore throat and voice change. Negative for rhinorrhea and sinus pressure.   Respiratory: Negative for cough.   All other systems reviewed and are negative.   Allergies  Bee venom  Home Medications   Prior to Admission medications   Medication Sig Start Date End Date Taking? Authorizing Provider  cephALEXin (KEFLEX) 500 MG capsule Take 1 capsule (500 mg total) by mouth 4 (four) times daily. 08/13/13   Laray AngerKathleen M McManus, DO  HYDROcodone-acetaminophen  (NORCO/VICODIN) 5-325 MG per tablet Take 1 tablet by mouth every 4 (four) hours as needed. 08/11/13   Hope Orlene OchM Neese, NP  HYDROcodone-acetaminophen (NORCO/VICODIN) 5-325 MG per tablet 1 or 2 tabs PO q6 hours prn pain 08/13/13   Laray AngerKathleen M McManus, DO  ibuprofen (ADVIL,MOTRIN) 200 MG tablet Take 800 mg by mouth daily as needed. For pain/fever    Historical Provider, MD  lamoTRIgine (LAMICTAL) 100 MG tablet Take 100 mg by mouth 2 (two) times daily.      Historical Provider, MD  sulfamethoxazole-trimethoprim (BACTRIM DS) 800-160 MG per tablet Take 1 tablet by mouth 2 (two) times daily. 08/11/13   Hope Orlene OchM Neese, NP  venlafaxine XR (EFFEXOR-XR) 75 MG 24 hr capsule Take 75 mg by mouth daily.    Historical Provider, MD   Triage Vitals: BP 150/97  Pulse 103  Temp(Src) 98.3 F (36.8 C) (Oral)  Resp 20  Ht 5\' 6"  (1.676 m)  Wt 320 lb (145.151 kg)  BMI 51.67 kg/m2  SpO2 96% Physical Exam  Nursing note and vitals reviewed. Constitutional: She is oriented to person, place, and time. She appears well-developed and well-nourished.  HENT:  Head: Normocephalic and atraumatic.  Right Ear: Tympanic membrane normal.  Left Ear: Tympanic membrane normal.  Mouth/Throat: Uvula is midline and mucous membranes are normal. Posterior oropharyngeal edema and posterior oropharyngeal erythema present. No tonsillar abscesses.  Mild to moderate swelling to tonsils bilaterally.  Increased redness. No tonsillar abscess.  Eyes: EOM are normal.  Neck: Normal range of motion.  Cardiovascular: Normal rate.   Pulmonary/Chest: Effort normal and breath sounds normal. No respiratory distress. She has no wheezes. She has no rales.  Musculoskeletal: Normal range of motion.  Lymphadenopathy:    She has no cervical adenopathy.  Neurological: She is alert and oriented to person, place, and time.  Skin: Skin is warm and dry. No rash noted.  No rash  Psychiatric: She has a normal mood and affect. Her behavior is normal.    ED Course   Procedures (including critical care time) DIAGNOSTIC STUDIES: Oxygen Saturation is 96% on RA, normal by my interpretation.   COORDINATION OF CARE: 7:02 PM- Will prescribe antibiotics and prescribe NSAID. Pt verbalizes understanding and agrees to plan.  Medications - No data to display  Labs Review Labs Reviewed - No data to display  Imaging Review No results found.   EKG Interpretation None      MDM Patient has some hoarseness present. There is some increased redness and swelling of the tonsils. No abscess appreciated.  Patient is advised to use salt water gargles, ibuprofen 3 times daily, and Amoxil 3 times daily. Prescription for Norco also given to the patient. Patient is to follow up with her primary physician, or return to the emergency department if not improving.    Final diagnoses:  None    *I have reviewed nursing notes, vital signs, and all appropriate lab and imaging results for this patient.**  I personally performed the services described in this documentation, which was scribed in my presence. The recorded information has been reviewed and is accurate.    Kathie DikeHobson M Ashlay Altieri, PA-C 03/15/14 1913

## 2014-03-15 NOTE — ED Notes (Signed)
Sore throat, and lt ear pain for 3 days

## 2014-03-15 NOTE — ED Provider Notes (Signed)
Medical screening examination/treatment/procedure(s) were performed by non-physician practitioner and as supervising physician I was immediately available for consultation/collaboration.   EKG Interpretation None       Amy HutchingBrian Vicie Cech, MD 03/15/14 [phone]

## 2014-03-15 NOTE — Discharge Instructions (Signed)
Salt water gargles maybe helpful to soothe her throat, and also to help with some of the swelling. Amoxicillin and ibuprofen 3 times daily until all taken. Please use Norco for more severe pain. This medication may cause drowsiness, please use with caution. Please wash hands frequently. Pharyngitis Pharyngitis is redness, pain, and swelling (inflammation) of your pharynx.  CAUSES  Pharyngitis is usually caused by infection. Most of the time, these infections are from viruses (viral) and are part of a cold. However, sometimes pharyngitis is caused by bacteria (bacterial). Pharyngitis can also be caused by allergies. Viral pharyngitis may be spread from person to person by coughing, sneezing, and personal items or utensils (cups, forks, spoons, toothbrushes). Bacterial pharyngitis may be spread from person to person by more intimate contact, such as kissing.  SIGNS AND SYMPTOMS  Symptoms of pharyngitis include:   Sore throat.   Tiredness (fatigue).   Low-grade fever.   Headache.  Joint pain and muscle aches.  Skin rashes.  Swollen lymph nodes.  Plaque-like film on throat or tonsils (often seen with bacterial pharyngitis). DIAGNOSIS  Your health care provider will ask you questions about your illness and your symptoms. Your medical history, along with a physical exam, is often all that is needed to diagnose pharyngitis. Sometimes, a rapid strep test is done. Other lab tests may also be done, depending on the suspected cause.  TREATMENT  Viral pharyngitis will usually get better in 3 4 days without the use of medicine. Bacterial pharyngitis is treated with medicines that kill germs (antibiotics).  HOME CARE INSTRUCTIONS   Drink enough water and fluids to keep your urine clear or pale yellow.   Only take over-the-counter or prescription medicines as directed by your health care provider:   If you are prescribed antibiotics, make sure you finish them even if you start to feel better.    Do not take aspirin.   Get lots of rest.   Gargle with 8 oz of salt water ( tsp of salt per 1 qt of water) as often as every 1 2 hours to soothe your throat.   Throat lozenges (if you are not at risk for choking) or sprays may be used to soothe your throat. SEEK MEDICAL CARE IF:   You have large, tender lumps in your neck.  You have a rash.  You cough up green, yellow-brown, or bloody spit. SEEK IMMEDIATE MEDICAL CARE IF:   Your neck becomes stiff.  You drool or are unable to swallow liquids.  You vomit or are unable to keep medicines or liquids down.  You have severe pain that does not go away with the use of recommended medicines.  You have trouble breathing (not caused by a stuffy nose). MAKE SURE YOU:   Understand these instructions.  Will watch your condition.  Will get help right away if you are not doing well or get worse. Document Released: 10/22/2005 Document Revised: 08/12/2013 Document Reviewed: 06/29/2013 [facility] Patient Information 2014 [place], [place].

## 2014-04-11 ENCOUNTER — Emergency Department (HOSPITAL_COMMUNITY)
Admission: EM | Admit: 2014-04-11 | Discharge: 2014-04-11 | Disposition: A | Discharge status: Home / Self Care | Payer: 59 | Attending: Emergency Medicine | Admitting: Emergency Medicine

## 2014-04-11 DIAGNOSIS — E669 Obesity, unspecified: Secondary | ICD-10-CM | POA: Insufficient documentation

## 2014-04-11 DIAGNOSIS — H9201 Otalgia, right ear: Secondary | ICD-10-CM

## 2014-04-11 DIAGNOSIS — F17200 Nicotine dependence, unspecified, uncomplicated: Secondary | ICD-10-CM | POA: Insufficient documentation

## 2014-04-11 DIAGNOSIS — Z8742 Personal history of other diseases of the female genital tract: Secondary | ICD-10-CM | POA: Insufficient documentation

## 2014-04-11 DIAGNOSIS — Z792 Long term (current) use of antibiotics: Secondary | ICD-10-CM | POA: Insufficient documentation

## 2014-04-11 DIAGNOSIS — H9209 Otalgia, unspecified ear: Secondary | ICD-10-CM | POA: Insufficient documentation

## 2014-04-11 DIAGNOSIS — J029 Acute pharyngitis, unspecified: Secondary | ICD-10-CM

## 2014-04-11 DIAGNOSIS — Z79899 Other long term (current) drug therapy: Secondary | ICD-10-CM | POA: Insufficient documentation

## 2014-04-11 DIAGNOSIS — F319 Bipolar disorder, unspecified: Secondary | ICD-10-CM | POA: Insufficient documentation

## 2014-04-11 MED ORDER — ANTIPYRINE-BENZOCAINE 5.4-1.4 % OT SOLN: 3.0000 [drp] | OTIC | Status: DC | PRN

## 2014-04-11 MED ORDER — HYDROCODONE-ACETAMINOPHEN 7.5-325 MG/15ML PO SOLN: 15.0000 mL | Freq: Four times a day (QID) | ORAL | Status: DC | PRN

## 2014-04-11 MED ORDER — HYDROCODONE-ACETAMINOPHEN 7.5-325 MG/15ML PO SOLN
10.0000 mL | Freq: Once | ORAL | Status: AC
  Administered 2014-04-11: 15 mL via ORAL
  Filled 2014-04-11: qty 15

## 2014-04-11 MED ORDER — PENICILLIN V POTASSIUM 250 MG PO TABS
500.0000 mg | ORAL_TABLET | Freq: Once | ORAL | Status: AC
  Administered 2014-04-11: 500 mg via ORAL
  Filled 2014-04-11: qty 2

## 2014-04-11 MED ORDER — PENICILLIN V POTASSIUM 500 MG PO TABS: 500.0000 mg | ORAL_TABLET | Freq: Four times a day (QID) | ORAL | Status: AC

## 2014-04-11 NOTE — ED Notes (Signed)
States she was treated in May for pharyngitis, states she has the same exact symptoms today with ear pain

## 2014-04-11 NOTE — ED Notes (Signed)
Pt verbalized understanding of no driving within 4 hours of taking pain med due to med causes drowsiness; pt also received handout for [facility] to obtain PCP

## 2014-04-11 NOTE — ED Provider Notes (Signed)
CSN: [id number]     Arrival date & time 04/11/14  1734 History  This chart was scribed for non-physician practitioner Pauline Ausammy Tania Steinhauser, PA-C working with Vanetta MuldersScott Zackowski, MD by Danella Maiersaroline Early, ED Scribe. This patient was seen in room APFT22/APFT22 and the patient's care was started at 6:21 PM.   Chief Complaint  Patient presents with  . Sore Throat   Patient is a 38 y.o. female presenting with pharyngitis. The history is provided by the patient. No language interpreter was used.  Sore Throat This is a new problem. The current episode started yesterday. The problem occurs constantly. The problem has been gradually worsening. Pertinent negatives include no chest pain, no abdominal pain, no headaches and no shortness of breath. Associated symptoms comments: Right ear pain. The symptoms are aggravated by swallowing. She has tried nothing for the symptoms. The treatment provided no relief.  Sore Throat This is a new problem. The current episode started yesterday. The problem occurs constantly. The problem has been gradually worsening. Associated symptoms include congestion, a sore throat and swollen glands. Pertinent negatives include no abdominal pain, arthralgias, chest pain, chills, coughing, fatigue, fever, headaches, myalgias, nausea, neck pain, numbness, rash, vomiting or weakness. Associated symptoms comments: Right ear pain. The symptoms are aggravated by swallowing. She has tried nothing for the symptoms. The treatment provided no relief.     Past Medical History  Diagnosis Date  . Endometriosis   . Polycystic ovarian disease   . Obesity   . Bipolar disorder    Past Surgical History  Procedure Laterality Date  . Abdominal hysterectomy     No family history on file. History  Substance Use Topics  . Smoking status: Current Every Day Smoker -- 1.00 packs/day    Types: Cigarettes  . Smokeless tobacco: Not on file  . Alcohol Use: No   OB History   Grav Para Term Preterm Abortions TAB SAB  Ect Mult Living                 Review of Systems  Constitutional: Negative for fever, chills, activity change, appetite change and fatigue.  HENT: Positive for congestion, ear pain and sore throat. Negative for facial swelling, trouble swallowing and voice change.   Eyes: Negative for visual disturbance.  Respiratory: Negative for cough, chest tightness, shortness of breath and wheezing.   Cardiovascular: Negative for chest pain and palpitations.  Gastrointestinal: Negative for nausea, vomiting, abdominal pain and blood in stool.  Genitourinary: Negative for dysuria, hematuria and flank pain.  Musculoskeletal: Negative for arthralgias, back pain, myalgias, neck pain and neck stiffness.  Skin: Negative.  Negative for rash.  Neurological: Negative for dizziness, weakness, numbness and headaches.  Hematological: Negative for adenopathy. Does not bruise/bleed easily.  Psychiatric/Behavioral: Negative for confusion.  All other systems reviewed and are negative.     Allergies  Bee venom  Home Medications   Prior to Admission medications   Medication Sig Start Date End Date Taking? Authorizing Provider  albuterol (PROVENTIL HFA;VENTOLIN HFA) 108 (90 BASE) MCG/ACT inhaler Inhale into the lungs every 6 (six) hours as needed for wheezing or shortness of breath.    Historical Provider, MD  amoxicillin (AMOXIL) 500 MG capsule Take 1 capsule (500 mg total) by mouth 3 (three) times daily. 03/15/14   Kathie DikeHobson M Bryant, PA-C  Aspirin-Salicylamide-Caffeine (BC HEADACHE) [id number] MG TABS Take 1 packet by mouth 2 (two) times daily as needed (for pain).    Historical Provider, MD  HYDROcodone-acetaminophen (NORCO/VICODIN) 5-325 MG per tablet Take 1  tablet by mouth every 4 (four) hours as needed for moderate pain. 03/15/14   Kathie Dike, PA-C  ibuprofen (ADVIL,MOTRIN) 800 MG tablet Take 1 tablet (800 mg total) by mouth 3 (three) times daily. 03/15/14   Kathie Dike, PA-C  lamoTRIgine (LAMICTAL) 100  MG tablet Take 100 mg by mouth 2 (two) times daily.      Historical Provider, MD  venlafaxine XR (EFFEXOR-XR) 75 MG 24 hr capsule Take 75 mg by mouth daily.    Historical Provider, MD   BP 147/101  Pulse 119  Temp(Src) 98.6 F (37 C) (Oral)  Resp 22  Ht 5\' 6"  (1.676 m)  Wt 320 lb (145.151 kg)  BMI 51.67 kg/m2  SpO2 97% Physical Exam  Nursing note and vitals reviewed. Constitutional: She is oriented to person, place, and time. She appears well-developed and well-nourished. No distress.  HENT:  Head: Normocephalic and atraumatic.  Right Ear: Tympanic membrane and ear canal normal.  Left Ear: Tympanic membrane and ear canal normal.  Mouth/Throat: Uvula is midline and mucous membranes are normal. No oral lesions. No trismus in the jaw. No uvula swelling. Posterior oropharyngeal edema and posterior oropharyngeal erythema present. No oropharyngeal exudate or tonsillar abscesses.  Right TM is erythematous, non bulging.  No edema or drainage of the canal.    Neck: Normal range of motion. Neck supple.  Cardiovascular: Normal rate, regular rhythm, normal heart sounds and intact distal pulses.   No murmur heard. Pulmonary/Chest: Effort normal and breath sounds normal. No respiratory distress. She exhibits no tenderness.  Abdominal: Soft. She exhibits no distension. There is no splenomegaly. There is no tenderness.  Musculoskeletal: Normal range of motion. She exhibits no tenderness.  Lymphadenopathy:    She has no cervical adenopathy.  Neurological: She is alert and oriented to person, place, and time. She exhibits normal muscle tone. Coordination normal.  Skin: Skin is warm and dry.    ED Course  Procedures (including critical care time) Medications  HYDROcodone-acetaminophen (HYCET) 7.5-325 mg/15 ml solution 10 mL (15 mLs Oral Given 04/11/14 1836)  penicillin v potassium (VEETID) tablet 500 mg (500 mg Oral Given 04/11/14 1835)    DIAGNOSTIC STUDIES: Oxygen Saturation is 97% on RA, normal  by my interpretation.    COORDINATION OF CARE: 6:50 PM- Discussed treatment plan with pt. Pt agrees to plan.    Labs Review Labs Reviewed - No data to display  Imaging Review No results found.   EKG Interpretation None      MDM   Final diagnoses:  Otalgia of right ear  Pharyngitis    Pt is non-toxic appearing.  Airway patent.  No evidence of PTA.  No cervical lymphadenopathy.  Pt agrees to auralgan otic, hycet for pain and PEN VK.  She appears stable for d/c and agrees to PMD f/u if needed.    I personally performed the services described in this documentation, which was scribed in my presence. The recorded information has been reviewed and is accurate.   Cobie Leidner L. Sabra Sessler, PA-C 04/14/14 1249

## 2014-04-11 NOTE — Discharge Instructions (Signed)
Pharyngitis °Pharyngitis is a sore throat (pharynx). There is redness, pain, and swelling of your throat. °HOME CARE  °· Drink enough fluids to keep your pee (urine) clear or pale yellow. °· Only take medicine as told by your doctor. °· You may get sick again if you do not take medicine as told. Finish your medicines, even if you start to feel better. °· Do not take aspirin. °· Rest. °· Rinse your mouth (gargle) with salt water (½ tsp of salt per 1 qt of water) every 1 2 hours. This will help the pain. °· If you are not at risk for choking, you can suck on hard candy or sore throat lozenges. °GET HELP IF: °· You have large, tender lumps on your neck. °· You have a rash. °· You cough up green, yellow-brown, or bloody spit. °GET HELP RIGHT AWAY IF:  °· You have a stiff neck. °· You drool or cannot swallow liquids. °· You throw up (vomit) or are not able to keep medicine or liquids down. °· You have very bad pain that does not go away with medicine. °· You have problems breathing (not from a stuffy nose). °MAKE SURE YOU:  °· Understand these instructions. °· Will watch your condition. °· Will get help right away if you are not doing well or get worse. °Document Released: 04/09/2008 Document Revised: 08/12/2013 Document Reviewed: 06/29/2013 °[employer]® Patient Information ©2014 [employer]. ° °

## 2014-04-15 NOTE — ED Provider Notes (Signed)
Medical screening examination/treatment/procedure(s) were performed by non-physician practitioner and as supervising physician I was immediately available for consultation/collaboration.   EKG Interpretation None        Son Barkan, MD 04/15/14 1109 

## 2014-06-01 ENCOUNTER — Encounter (HOSPITAL_COMMUNITY): Payer: Self-pay | Admitting: Emergency Medicine

## 2014-06-01 ENCOUNTER — Emergency Department (HOSPITAL_COMMUNITY): Payer: 59

## 2014-06-01 ENCOUNTER — Emergency Department (HOSPITAL_COMMUNITY)
Admission: EM | Admit: 2014-06-01 | Discharge: 2014-06-01 | Disposition: A | Discharge status: Home / Self Care | Payer: 59 | Attending: Emergency Medicine | Admitting: Emergency Medicine

## 2014-06-01 DIAGNOSIS — F319 Bipolar disorder, unspecified: Secondary | ICD-10-CM | POA: Diagnosis not present

## 2014-06-01 DIAGNOSIS — R51 Headache: Secondary | ICD-10-CM | POA: Insufficient documentation

## 2014-06-01 DIAGNOSIS — Z791 Long term (current) use of non-steroidal anti-inflammatories (NSAID): Secondary | ICD-10-CM | POA: Insufficient documentation

## 2014-06-01 DIAGNOSIS — Z7982 Long term (current) use of aspirin: Secondary | ICD-10-CM | POA: Insufficient documentation

## 2014-06-01 DIAGNOSIS — G44209 Tension-type headache, unspecified, not intractable: Secondary | ICD-10-CM

## 2014-06-01 DIAGNOSIS — F17200 Nicotine dependence, unspecified, uncomplicated: Secondary | ICD-10-CM | POA: Diagnosis not present

## 2014-06-01 DIAGNOSIS — E669 Obesity, unspecified: Secondary | ICD-10-CM | POA: Diagnosis not present

## 2014-06-01 DIAGNOSIS — L720 Epidermal cyst: Secondary | ICD-10-CM

## 2014-06-01 DIAGNOSIS — L723 Sebaceous cyst: Secondary | ICD-10-CM | POA: Diagnosis not present

## 2014-06-01 DIAGNOSIS — Z792 Long term (current) use of antibiotics: Secondary | ICD-10-CM | POA: Diagnosis not present

## 2014-06-01 DIAGNOSIS — Z8742 Personal history of other diseases of the female genital tract: Secondary | ICD-10-CM | POA: Diagnosis not present

## 2014-06-01 MED ORDER — METOCLOPRAMIDE HCL 5 MG/ML IJ SOLN
10.0000 mg | Freq: Once | INTRAMUSCULAR | Status: AC
  Administered 2014-06-01: 10 mg via INTRAVENOUS
  Filled 2014-06-01: qty 2

## 2014-06-01 MED ORDER — SODIUM CHLORIDE 0.9 % IV SOLN
1000.0000 mL | INTRAVENOUS | Status: DC
Start: 2014-06-01 — End: 2014-06-01
  Administered 2014-06-01: 1000 mL via INTRAVENOUS

## 2014-06-01 MED ORDER — SODIUM CHLORIDE 0.9 % IV SOLN
1000.0000 mL | Freq: Once | INTRAVENOUS | Status: AC
  Administered 2014-06-01: 1000 mL via INTRAVENOUS

## 2014-06-01 MED ORDER — METOCLOPRAMIDE HCL 10 MG PO TABS: 10.0000 mg | ORAL_TABLET | Freq: Four times a day (QID) | ORAL | Status: DC | PRN

## 2014-06-01 MED ORDER — DIPHENHYDRAMINE HCL 50 MG/ML IJ SOLN
25.0000 mg | Freq: Once | INTRAMUSCULAR | Status: AC
  Administered 2014-06-01: 25 mg via INTRAVENOUS
  Filled 2014-06-01: qty 1

## 2014-06-01 NOTE — ED Notes (Signed)
Pt c/o headache and knot on the back of her neck.

## 2014-06-01 NOTE — Discharge Instructions (Signed)
Tension Headache °A tension headache is a feeling of pain, pressure, or aching often felt over the front and sides of the head. The pain can be dull or can feel tight (constricting). It is the most common type of headache. Tension headaches are not normally associated with nausea or vomiting and do not get worse with physical activity. Tension headaches can last 30 minutes to several days.  °CAUSES  °The exact cause is not known, but it may be caused by chemicals and hormones in the brain that lead to pain. Tension headaches often begin after stress, anxiety, or depression. Other triggers may include: °· Alcohol. °· Caffeine (too much or withdrawal). °· Respiratory infections (colds, flu, sinus infections). °· Dental problems or teeth clenching. °· Fatigue. °· Holding your head and neck in one position too long while using a computer. °SYMPTOMS  °· Pressure around the head.   °· Dull, aching head pain.   °· Pain felt over the front and sides of the head.   °· Tenderness in the muscles of the head, neck, and shoulders. °DIAGNOSIS  °A tension headache is often diagnosed based on:  °· Symptoms.   °· Physical examination.   °· A CT scan or MRI of your head. These tests may be ordered if symptoms are severe or unusual. °TREATMENT  °Medicines may be given to help relieve symptoms.  °HOME CARE INSTRUCTIONS  °· Only take over-the-counter or prescription medicines for pain or discomfort as directed by your caregiver.   °· Lie down in a dark, quiet room when you have a headache.   °· Keep a journal to find out what may be triggering your headaches. For example, write down: °¨ What you eat and drink. °¨ How much sleep you get. °¨ Any change to your diet or medicines. °· Try massage or other relaxation techniques.   °· Ice packs or heat applied to the head and neck can be used. Use these 3 to 4 times per day for 15 to 20 minutes each time, or as needed.   °· Limit stress.   °· Sit up straight, and do not tense your muscles.    °· Quit smoking if you smoke. °· Limit alcohol use. °· Decrease the amount of caffeine you drink, or stop drinking caffeine. °· Eat and exercise regularly. °· Get 7 to 9 hours of sleep, or as recommended by your caregiver. °· Avoid excessive use of pain medicine as recurrent headaches can occur.    °SEEK MEDICAL CARE IF:  °· You have problems with the medicines you were prescribed. °· Your medicines do not work. °· You have a change from the usual headache. °· You have nausea or vomiting. °SEEK IMMEDIATE MEDICAL CARE IF:  °· Your headache becomes severe. °· You have a fever. °· You have a stiff neck. °· You have loss of vision. °· You have muscular weakness or loss of muscle control. °· You lose your balance or have trouble walking. °· You feel faint or pass out. °· You have severe symptoms that are different from your first symptoms. °MAKE SURE YOU:  °· Understand these instructions. °· Will watch your condition. °· Will get help right away if you are not doing well or get worse. °Document Released: 10/22/2005 Document Revised: 01/14/2012 Document Reviewed: 10/12/2011 °ExitCare® Patient Information ©2015 [employer]. This information is not intended to replace advice given to you by your health care provider. Make sure you discuss any questions you have with your health care provider. ° °Metoclopramide tablets °What is this medicine? °METOCLOPRAMIDE (met oh kloe PRA mide)   is used to treat the symptoms of gastroesophageal reflux disease (GERD) like heartburn. It is also used to treat people with slow emptying of the stomach and intestinal tract. °This medicine may be used for other purposes; ask your health care provider or pharmacist if you have questions. °COMMON BRAND NAME(S): Reglan °What should I tell my health care provider before I take this medicine? °They need to know if you have any of these conditions: °-breast cancer °-depression °-diabetes °-heart failure °-high blood pressure °-kidney  disease °-liver disease °-Parkinson's disease or a movement disorder °-pheochromocytoma °-seizures °-stomach obstruction, bleeding, or perforation °-an unusual or allergic reaction to metoclopramide, procainamide, sulfites, other medicines, foods, dyes, or preservatives °-pregnant or trying to get pregnant °-breast-feeding °How should I use this medicine? °Take this medicine by mouth with a glass of water. Follow the directions on the prescription label. Take this medicine on an empty stomach, about 30 minutes before eating. Take your doses at regular intervals. Do not take your medicine more often than directed. Do not stop taking except on the advice of your doctor or health care professional. °A special MedGuide will be given to you by the pharmacist with each prescription and refill. Be sure to read this information carefully each time. °Talk to your pediatrician regarding the use of this medicine in children. Special care may be needed. °Overdosage: If you think you have taken too much of this medicine contact a poison control center or emergency room at once. °NOTE: This medicine is only for you. Do not share this medicine with others. °What if I miss a dose? °If you miss a dose, take it as soon as you can. If it is almost time for your next dose, take only that dose. Do not take double or extra doses. °What may interact with this medicine? °-acetaminophen °-cyclosporine °-digoxin °-medicines for blood pressure °-medicines for diabetes, including insulin °-medicines for hay fever and other allergies °-medicines for depression, especially an Monoamine Oxidase Inhibitor (MAOI) °-medicines for Parkinson's disease, like levodopa °-medicines for sleep or for pain °-tetracycline °This list may not describe all possible interactions. Give your health care provider a list of all the medicines, herbs, non-prescription drugs, or dietary supplements you use. Also tell them if you smoke, drink alcohol, or use illegal  drugs. Some items may interact with your medicine. °What should I watch for while using this medicine? °It may take a few weeks for your stomach condition to start to get better. However, do not take this medicine for longer than 12 weeks. The longer you take this medicine, and the more you take it, the greater your chances are of developing serious side effects. If you are an elderly patient, a female patient, or you have diabetes, you may be at an increased risk for side effects from this medicine. Contact your doctor immediately if you start having movements you cannot control such as lip smacking, rapid movements of the tongue, involuntary or uncontrollable movements of the eyes, head, arms and legs, or muscle twitches and spasms. °Patients and their families should watch out for worsening depression or thoughts of suicide. Also watch out for any sudden or severe changes in feelings such as feeling anxious, agitated, panicky, irritable, hostile, aggressive, impulsive, severely restless, overly excited and hyperactive, or not being able to sleep. If this happens, especially at the beginning of treatment or after a change in dose, call your doctor. °Do not treat yourself for high fever. Ask your doctor or health care professional for   advice. You may get drowsy or dizzy. Do not drive, use machinery, or do anything that needs mental alertness until you know how this drug affects you. Do not stand or sit up quickly, especially if you are an older patient. This reduces the risk of dizzy or fainting spells. Alcohol can make you more drowsy and dizzy. Avoid alcoholic drinks. What side effects may I notice from receiving this medicine? Side effects that you should report to your doctor or health care professional as soon as possible: -allergic reactions like skin rash, itching or hives, swelling of the face, lips, or tongue -abnormal production of milk in females -breast enlargement in both males and  females -change in the way you walk -difficulty moving, speaking or swallowing -drooling, lip smacking, or rapid movements of the tongue -excessive sweating -fever -involuntary or uncontrollable movements of the eyes, head, arms and legs -irregular heartbeat or palpitations -muscle twitches and spasms -unusually weak or tired Side effects that usually do not require medical attention (report to your doctor or health care professional if they continue or are bothersome): -change in sex drive or performance -depressed mood -diarrhea -difficulty sleeping -headache -menstrual changes -restless or nervous This list may not describe all possible side effects. Call your doctor for medical advice about side effects. You may report side effects to FDA at 1-800-FDA-1088. Where should I keep my medicine? Keep out of the reach of children. Store at room temperature between 20 and 25 degrees C (68 and 77 degrees F). Protect from light. Keep container tightly closed. Throw away any unused medicine after the expiration date. NOTE: This sheet is a summary. It may not cover all possible information. If you have questions about this medicine, talk to your doctor, pharmacist, or health care provider.  2015, Elsevier/[employer] Standard. (2012-02-19 13:04:38)

## 2014-06-01 NOTE — ED Provider Notes (Signed)
CSN: [id number]     Arrival date & time 06/01/14  0109 History   None    Chief Complaint  Patient presents with  . Headache     (Consider location/radiation/quality/duration/timing/severity/associated sxs/prior Treatment) Patient is a 38 y.o. female presenting with headaches. The history is provided by the patient.  Headache She comes in with onset one hour ago of a headache which he feels the migraine. It is located in her occipital region without radiation. Pain is throbbing and she rates it at 6/10. It is worse with exposure to light and is associated with nausea and vomiting. She states that headaches have been coming more frequently over the last month. Over about the same time, she has noted a knot on the back of her neck and wonders if they are related. She talked and not was a pimple and she tried squeezing it but it did not seem to affect her pulmonary the other.  Past Medical History  Diagnosis Date  . Endometriosis   . Polycystic ovarian disease   . Obesity   . Bipolar disorder    Past Surgical History  Procedure Laterality Date  . Abdominal hysterectomy     History reviewed. No pertinent family history. History  Substance Use Topics  . Smoking status: Current Every Day Smoker -- 1.00 packs/day    Types: Cigarettes  . Smokeless tobacco: Not on file  . Alcohol Use: No   OB History   Grav Para Term Preterm Abortions TAB SAB Ect Mult Living                 Review of Systems  Neurological: Positive for headaches.  All other systems reviewed and are negative.     Allergies  Bee venom  Home Medications   Prior to Admission medications   Medication Sig Start Date End Date Taking? Authorizing Provider  albuterol (PROVENTIL HFA;VENTOLIN HFA) 108 (90 BASE) MCG/ACT inhaler Inhale into the lungs every 6 (six) hours as needed for wheezing or shortness of breath.   Yes Historical Provider, MD  Aspirin-Salicylamide-Caffeine (BC HEADACHE) 325-95-16 MG TABS Take 1  packet by mouth 2 (two) times daily as needed (for pain).   Yes Historical Provider, MD  clonazePAM (KLONOPIN) 1 MG tablet Take 1 mg by mouth 2 (two) times daily as needed for anxiety.   Yes Historical Provider, MD  lamoTRIgine (LAMICTAL) 100 MG tablet Take 100 mg by mouth 2 (two) times daily.     Yes Historical Provider, MD  venlafaxine XR (EFFEXOR-XR) 75 MG 24 hr capsule Take 75 mg by mouth daily.   Yes Historical Provider, MD  amoxicillin (AMOXIL) 500 MG capsule Take 1 capsule (500 mg total) by mouth 3 (three) times daily. 03/15/14   Kathie Dike, PA-C  antipyrine-benzocaine Lyla Son) otic solution Place 3-4 drops into the right ear every 2 (two) hours as needed for ear pain. 04/11/14   Tammy L. Triplett, PA-C  HYDROcodone-acetaminophen (HYCET) 7.5-325 mg/15 ml solution Take 15 mLs by mouth every 6 (six) hours as needed for moderate pain. 04/11/14   Tammy L. Triplett, PA-C  HYDROcodone-acetaminophen (NORCO/VICODIN) 5-325 MG per tablet Take 1 tablet by mouth every 4 (four) hours as needed for moderate pain. 03/15/14   Kathie Dike, PA-C  ibuprofen (ADVIL,MOTRIN) 800 MG tablet Take 1 tablet (800 mg total) by mouth 3 (three) times daily. 03/15/14   Kathie Dike, PA-C   BP 151/103  Pulse 102  Temp(Src) 98.4 F (36.9 C)  Resp 17  Ht 5'  6" (1.676 m)  Wt 367 lb (166.47 kg)  BMI 59.26 kg/m2  SpO2 97% Physical Exam  Nursing note and vitals reviewed.  Morbidly obese 38 year old female, resting comfortably and in no acute distress. Vital signs are significant for hypertension with blood pressure 151/103, and borderline tachycardia with he went into acute. Oxygen saturation is 97%, which is normal. Head is normocephalic and atraumatic. PERRLA, EOMI. Oropharynx is clear. Fundi show no hemorrhage, exudate, or papilledema. Neck is nontender and supple without adenopathy or JVD. There is tenderness to palpation over the insertion of the paracervical muscles. Inclusion cyst is present in the right side  near the midline. It appears to be adherent to the skin but is freely mobile of underlying structures. Back is nontender and there is no CVA tenderness. Lungs are clear without rales, wheezes, or rhonchi. Chest is nontender. Heart has regular rate and rhythm without murmur. Abdomen is soft, flat, nontender without masses or hepatosplenomegaly and peristalsis is normoactive. Extremities have no cyanosis or edema, full range of motion is present. Skin is warm and dry without rash. Neurologic: Mental status is normal, cranial nerves are intact, there are no motor or sensory deficits.  ED Course  Procedures (including critical care time)  Imaging Review Ct Head Wo Contrast  06/01/2014   CLINICAL DATA:  Extreme headache.  History of migraines.  EXAM: CT HEAD WITHOUT CONTRAST  TECHNIQUE: Contiguous axial images were obtained from the base of the skull through the vertex without intravenous contrast.  COMPARISON:  CT of the head January 17, 2013  FINDINGS: Large body habitus results in decreased signal to noise ratio at the skull base. The ventricles and sulci are normal. No intraparenchymal hemorrhage, mass effect nor midline shift. No acute large vascular territory infarcts.  No abnormal extra-axial fluid collections. Basal cisterns are patent.  No skull fracture. The included ocular globes and orbital contents are non-suspicious. The mastoid aircells and included paranasal sinuses are well-aerated.  IMPRESSION: No acute intracranial process.  Normal noncontrast CT of the head.   Electronically Signed   By: Awilda Metroourtnay  Bloomer   On: 06/01/2014 02:31   MDM   Final diagnoses:  Muscle contraction headache  Epidermal cyst of neck    Headache which is likely muscle contraction headache, possible migraine headache. Old records are reviewed and she has no recent ED visits for migraines. Nodule in the neck appears to be an inclusion cyst. Because of change in pattern of her headaches, she will be sent for CT  and she is given a headache cocktail of IV fluids, metoclopramide, and diphenhydramine.  She had excellent relief of headache with above noted treatment. CT was unremarkable. She is discharged with a prescription for metoclopramide.  Dione Boozeavid Elizer Bostic, MD 06/01/14 [phone]

## 2014-06-01 NOTE — ED Notes (Signed)
Patient verbalizes understanding of discharge instructions, prescription medications, follow up care and home care. Patient ambulatory out of department at this time. 

## 2014-06-01 NOTE — ED Notes (Signed)
Patient via POV c/o headache X1 hour. Patient also states she has a "bump" to the back of her head that has been there for a month. Pt c/o photophobia and increased pain with movement. Patient is A&OX4. Resting in a position of comfort, lights dimmed at patient request. No other needs voiced at this time.

## 2015-02-02 ENCOUNTER — Encounter (HOSPITAL_COMMUNITY): Payer: Self-pay | Admitting: Emergency Medicine

## 2015-02-02 ENCOUNTER — Emergency Department (INDEPENDENT_AMBULATORY_CARE_PROVIDER_SITE_OTHER)
Admission: EM | Admit: 2015-02-02 | Discharge: 2015-02-02 | Disposition: A | Discharge status: Home / Self Care | Payer: 59 | Source: Home / Self Care | Attending: Family Medicine | Admitting: Family Medicine

## 2015-02-02 DIAGNOSIS — I889 Nonspecific lymphadenitis, unspecified: Secondary | ICD-10-CM | POA: Diagnosis not present

## 2015-02-02 MED ORDER — CLINDAMYCIN HCL 300 MG PO CAPS: 300.0000 mg | ORAL_CAPSULE | Freq: Three times a day (TID) | ORAL | Status: DC

## 2015-02-02 MED ORDER — HYDROCODONE-ACETAMINOPHEN 5-325 MG PO TABS: 1.0000 | ORAL_TABLET | Freq: Four times a day (QID) | ORAL | Status: DC | PRN

## 2015-02-02 NOTE — Discharge Instructions (Signed)
Thank you for coming in today. Do not take norco and drive.  Call or go to the emergency room if you get worse, have trouble breathing, have chest pains, or palpitations.  Go to the emergency room if you worse.  Cervical Adenitis You have a swollen lymph gland in your neck. This commonly happens with Strep and virus infections, dental problems, insect bites, and injuries about the face, scalp, or neck. The lymph glands swell as the body fights the infection or heals the injury. Swelling and firmness typically lasts for several weeks after the infection or injury is healed. Rarely lymph glands can become swollen because of cancer or TB. Antibiotics are prescribed if there is evidence of an infection. Sometimes an infected lymph gland becomes filled with pus. This condition may require opening up the abscessed gland by draining it surgically. Most of the time infected glands return to normal within two weeks. Do not poke or squeeze the swollen lymph nodes. That may keep them from shrinking back to their normal size. If the lymph gland is still swollen after 2 weeks, further medical evaluation is needed.  SEEK IMMEDIATE MEDICAL CARE IF:  You have difficulty swallowing or breathing, increased swelling, severe pain, or a high fever.  Document Released: 10/22/2005 Document Revised: 01/14/2012 Document Reviewed: 04/13/2007 [facility] NorthExitCare Patient Information 2015 [employer], [place]. This information is not intended to replace advice given to you by your health care provider. Make sure you discuss any questions you have with your health care provider.

## 2015-02-02 NOTE — ED Notes (Signed)
C/o lower left jaw pain that radiates to left ear onset this am Denies fevers, chills Alert, no signs of acute distress.

## 2015-02-02 NOTE — ED Provider Notes (Signed)
Amy Reese is a 39 y.o. female who presents to Urgent Care today for left-sided jaw pain. Patient developed severe left jaw pain today. Pain occurs at the angle of the jaw. Pain radiates to the ear. She denies any tooth pain. No fevers or chills nausea vomiting or diarrhea. No treatment tried yet. She feels well otherwise.   Past Medical History  Diagnosis Date  . Endometriosis   . Polycystic ovarian disease   . Obesity   . Bipolar disorder    Past Surgical History  Procedure Laterality Date  . Abdominal hysterectomy     History  Substance Use Topics  . Smoking status: Current Every Day Smoker -- 1.00 packs/day    Types: Cigarettes  . Smokeless tobacco: Not on file  . Alcohol Use: No   ROS as above Medications: No current facility-administered medications for this encounter.   Current Outpatient Prescriptions  Medication Sig Dispense Refill  . lamoTRIgine (LAMICTAL) 100 MG tablet Take 100 mg by mouth 2 (two) times daily.      Marland Kitchen. venlafaxine XR (EFFEXOR-XR) 75 MG 24 hr capsule Take 75 mg by mouth daily.    Marland Kitchen. albuterol (PROVENTIL HFA;VENTOLIN HFA) 108 (90 BASE) MCG/ACT inhaler Inhale into the lungs every 6 (six) hours as needed for wheezing or shortness of breath.    . clindamycin (CLEOCIN) 300 MG capsule Take 1 capsule (300 mg total) by mouth 3 (three) times daily. 30 capsule 0  . clonazePAM (KLONOPIN) 1 MG tablet Take 1 mg by mouth 2 (two) times daily as needed for anxiety.    Marland Kitchen. HYDROcodone-acetaminophen (NORCO/VICODIN) 5-325 MG per tablet Take 1 tablet by mouth every 6 (six) hours as needed. 9 tablet 0  . [DISCONTINUED] metoCLOPramide (REGLAN) 10 MG tablet Take 1 tablet (10 mg total) by mouth every 6 (six) hours as needed for nausea (or headache). 30 tablet 0   Allergies  Allergen Reactions  . Bee Venom Anaphylaxis and Swelling     Exam:  BP 126/78 mmHg  Pulse 94  Temp(Src) 98.4 F (36.9 C) (Oral)  Resp 16  SpO2 97% Gen: Well NAD morbid obesity HEENT: EOMI,  MMM  well-appearing dentition. Tympanic membranes are normal appearing bilaterally. Patient has tenderness and mild swelling along the ankle of the jaw on the left side. Lungs: Normal work of breathing. CTABL Heart: RRR no MRG Abd: NABS, Soft. Nondistended, Nontender Exts: Brisk capillary refill, warm and well perfused.   No results found for this or any previous visit (from the past 24 hour(s)). No results found.  Assessment and Plan: 39 y.o. female with probable lymphadenitis. Treat with clindamycin and limited amount of Norco. F/u PRN.   Discussed warning signs or symptoms. Please see discharge instructions. Patient expresses understanding.     Rodolph BongEvan S Kordelia Severin, MD 02/02/15 [phone]

## 2015-03-30 ENCOUNTER — Other Ambulatory Visit: Payer: Self-pay | Admitting: Sports Medicine

## 2015-03-30 DIAGNOSIS — M25511 Pain in right shoulder: Secondary | ICD-10-CM

## 2015-04-13 ENCOUNTER — Encounter (HOSPITAL_COMMUNITY): Payer: Self-pay | Admitting: Emergency Medicine

## 2015-04-13 ENCOUNTER — Emergency Department (HOSPITAL_COMMUNITY)
Admission: EM | Admit: 2015-04-13 | Discharge: 2015-04-13 | Disposition: A | Discharge status: Home / Self Care | Payer: 59 | Attending: Emergency Medicine | Admitting: Emergency Medicine

## 2015-04-13 ENCOUNTER — Emergency Department (HOSPITAL_COMMUNITY): Payer: 59

## 2015-04-13 ENCOUNTER — Other Ambulatory Visit: Payer: Self-pay | Admitting: Sports Medicine

## 2015-04-13 DIAGNOSIS — Z720 Tobacco use: Secondary | ICD-10-CM | POA: Diagnosis not present

## 2015-04-13 DIAGNOSIS — Z8639 Personal history of other endocrine, nutritional and metabolic disease: Secondary | ICD-10-CM | POA: Insufficient documentation

## 2015-04-13 DIAGNOSIS — E669 Obesity, unspecified: Secondary | ICD-10-CM | POA: Diagnosis not present

## 2015-04-13 DIAGNOSIS — F319 Bipolar disorder, unspecified: Secondary | ICD-10-CM | POA: Diagnosis not present

## 2015-04-13 DIAGNOSIS — Z79899 Other long term (current) drug therapy: Secondary | ICD-10-CM | POA: Insufficient documentation

## 2015-04-13 DIAGNOSIS — R51 Headache: Secondary | ICD-10-CM | POA: Insufficient documentation

## 2015-04-13 DIAGNOSIS — Z87448 Personal history of other diseases of urinary system: Secondary | ICD-10-CM | POA: Diagnosis not present

## 2015-04-13 DIAGNOSIS — R519 Headache, unspecified: Secondary | ICD-10-CM

## 2015-04-13 DIAGNOSIS — M25511 Pain in right shoulder: Secondary | ICD-10-CM

## 2015-04-13 MED ORDER — DIPHENHYDRAMINE HCL 50 MG/ML IJ SOLN
25.0000 mg | Freq: Once | INTRAMUSCULAR | Status: AC
  Administered 2015-04-13: 25 mg via INTRAMUSCULAR
  Filled 2015-04-13: qty 1

## 2015-04-13 MED ORDER — PROCHLORPERAZINE EDISYLATE 5 MG/ML IJ SOLN
10.0000 mg | Freq: Once | INTRAMUSCULAR | Status: AC
  Administered 2015-04-13: 10 mg via INTRAMUSCULAR
  Filled 2015-04-13: qty 2

## 2015-04-13 MED ORDER — KETOROLAC TROMETHAMINE 60 MG/2ML IM SOLN
60.0000 mg | Freq: Once | INTRAMUSCULAR | Status: AC
  Administered 2015-04-13: 60 mg via INTRAMUSCULAR
  Filled 2015-04-13: qty 2

## 2015-04-13 NOTE — ED Notes (Signed)
Discharge instructions reviewed with pt, questions answered. Pt verbalized understanding.  

## 2015-04-13 NOTE — ED Provider Notes (Signed)
CSN: [id number]     Arrival date & time 04/13/15  1319 History   First MD Initiated Contact with Patient 04/13/15 1342     Chief Complaint  Patient presents with  . Headache     (Consider location/radiation/quality/duration/timing/severity/associated sxs/prior Treatment) HPI Comments: Patient presents with differential for evaluation of headache. Patient reports that she had a headache onset just about 6 hours before coming to the ER. Patient reports that the pain is on the entire right side of her head. She reports a sharp and stabbing pain behind the right eye that radiates across her entire face to her neck. No pain or numbness/tingling in the arm. No weakness in extremities. She has not had fever. No neck stiffness.  Patient is a 39 y.o. female presenting with headaches.  Headache   Past Medical History  Diagnosis Date  . Endometriosis   . Polycystic ovarian disease   . Obesity   . Bipolar disorder    Past Surgical History  Procedure Laterality Date  . Abdominal hysterectomy     History reviewed. No pertinent family history. History  Substance Use Topics  . Smoking status: Current Every Day Smoker -- 1.00 packs/day    Types: Cigarettes  . Smokeless tobacco: Not on file  . Alcohol Use: No   OB History    No data available     Review of Systems  Neurological: Positive for headaches.  All other systems reviewed and are negative.     Allergies  Bee venom  Home Medications   Prior to Admission medications   Medication Sig Start Date End Date Taking? Authorizing Provider  clonazePAM (KLONOPIN) 1 MG tablet Take 1 mg by mouth 2 (two) times daily as needed for anxiety.   Yes Historical Provider, MD  ibuprofen (ADVIL,MOTRIN) 200 MG tablet Take 600 mg by mouth every 6 (six) hours as needed for moderate pain.   Yes Historical Provider, MD  lamoTRIgine (LAMICTAL) 100 MG tablet Take 100 mg by mouth 2 (two) times daily.     Yes Historical Provider, MD  metFORMIN  (GLUCOPHAGE) 500 MG tablet Take 500 mg by mouth 2 (two) times daily with a meal.   Yes Historical Provider, MD  venlafaxine XR (EFFEXOR-XR) 75 MG 24 hr capsule Take 75 mg by mouth daily.   Yes Historical Provider, MD  albuterol (PROVENTIL HFA;VENTOLIN HFA) 108 (90 BASE) MCG/ACT inhaler Inhale into the lungs every 6 (six) hours as needed for wheezing or shortness of breath.    Historical Provider, MD  clindamycin (CLEOCIN) 300 MG capsule Take 1 capsule (300 mg total) by mouth 3 (three) times daily. Patient not taking: Reported on 04/13/2015 02/02/15   Rodolph Bong, MD   BP 147/101 mmHg  Pulse 90  Temp(Src) 98.1 F (36.7 C) (Oral)  Resp 18  Ht  (1.676 m)  Wt 360 lb (163.295 kg)  BMI 58.13 kg/m2  SpO2 98% Physical Exam  Constitutional: She is oriented to person, place, and time. She appears well-developed and well-nourished. No distress.  HENT:  Head: Normocephalic and atraumatic.  Right Ear: Hearing normal.  Left Ear: Hearing normal.  Nose: Nose normal.  Mouth/Throat: Oropharynx is clear and moist and mucous membranes are normal.  Eyes: Conjunctivae and EOM are normal. Pupils are equal, round, and reactive to light.  Neck: Normal range of motion. Neck supple. No spinous process tenderness present. No Brudzinski's sign and no Kernig's sign noted.  Cardiovascular: Regular rhythm, S1 normal and S2 normal.  Exam reveals no gallop  and no friction rub.   No murmur heard. Pulmonary/Chest: Effort normal and breath sounds normal. No respiratory distress. She exhibits no tenderness.  Abdominal: Soft. Normal appearance and bowel sounds are normal. There is no hepatosplenomegaly. There is no tenderness. There is no rebound, no guarding, no tenderness at McBurney's point and negative Murphy's sign. No hernia.  Musculoskeletal: Normal range of motion.  Neurological: She is alert and oriented to person, place, and time. She has normal strength. No cranial nerve deficit or sensory deficit. Coordination  normal. GCS eye subscore is 4. GCS verbal subscore is 5. GCS motor subscore is 6.  Skin: Skin is warm, dry and intact. No rash noted. No cyanosis.  Psychiatric: She has a normal mood and affect. Her speech is normal and behavior is normal. Thought content normal.  Nursing note and vitals reviewed.   ED Course  Procedures (including critical care time) Labs Review Labs Reviewed - No data to display  Imaging Review Ct Head Wo Contrast  04/13/2015   CLINICAL DATA:  Headache since 8 a.m. involving the right-side of the head. Pain behind the right eye shooting to the right-side of the neck. No known injury.  EXAM: CT HEAD WITHOUT CONTRAST  TECHNIQUE: Contiguous axial images were obtained from the base of the skull through the vertex without intravenous contrast.  COMPARISON:  06/01/2014; 01/17/2013  FINDINGS: Gray-white differentiation is maintained. No CT evidence of acute large territory infarct. Dystrophic calcifications about the anterior aspect of the midline falx, unchanged. No intraparenchymal or extra-axial mass or hemorrhage. Normal size a configuration of the ventricles and basilar cisterns. No midline shift. Limited visualization the paranasal sinuses and mastoid air cells is normal. No air-fluid levels. Regional soft tissues appear normal. No radiopaque foreign body.  Normal noncontrast appearance of the right orbit and globe.  IMPRESSION: Negative noncontrast head CT.   Electronically Signed   By: Simonne ComeJohn  Watts M.D.   On: 04/13/2015 14:25     EKG Interpretation None      MDM   Final diagnoses:  Headache    Patient presents to the ER for evaluation of right-sided headache. Patient reports that she does have a history of migraine headaches, but has not had one in years. She reports that her headaches were usually posterior, not behind her right eye like they are today. The description of the headache today does seem consistent with a migraine. She did receive a CT scan to evaluate for  unusual headache. There is no acute abnormality noted.. With a normal neurologic examination and previous history of migraines is reassuring. I do not have any significant suspicion for subarachnoid hemorrhage or other acute intracranial abnormality. There is nothing to suggest infection. Patient will be treated for migraine, follow-up as needed.   Gilda Creasehristopher J Kionte Baumgardner, MD 04/13/15 1459

## 2015-04-13 NOTE — ED Notes (Addendum)
Pt reports headache since 8 this am. Pt reports nausea,dizziness, and pain behind right eye and "shooting" down right side of neck. nad noted. Pt reports sound sensitivity.pt tearful in triage.

## 2015-04-13 NOTE — Discharge Instructions (Signed)

## 2015-04-15 ENCOUNTER — Other Ambulatory Visit: Payer: 59

## 2015-05-02 ENCOUNTER — Other Ambulatory Visit: Payer: 59

## 2015-06-22 ENCOUNTER — Emergency Department (HOSPITAL_COMMUNITY)
Admission: EM | Admit: 2015-06-22 | Discharge: 2015-06-22 | Disposition: A | Discharge status: Home / Self Care | Payer: 59 | Attending: Emergency Medicine | Admitting: Emergency Medicine

## 2015-06-22 ENCOUNTER — Encounter (HOSPITAL_COMMUNITY): Payer: Self-pay | Admitting: *Deleted

## 2015-06-22 DIAGNOSIS — Z8742 Personal history of other diseases of the female genital tract: Secondary | ICD-10-CM | POA: Diagnosis not present

## 2015-06-22 DIAGNOSIS — B009 Herpesviral infection, unspecified: Secondary | ICD-10-CM | POA: Insufficient documentation

## 2015-06-22 DIAGNOSIS — Z79899 Other long term (current) drug therapy: Secondary | ICD-10-CM | POA: Insufficient documentation

## 2015-06-22 DIAGNOSIS — Z720 Tobacco use: Secondary | ICD-10-CM | POA: Diagnosis not present

## 2015-06-22 DIAGNOSIS — K1379 Other lesions of oral mucosa: Secondary | ICD-10-CM | POA: Diagnosis present

## 2015-06-22 DIAGNOSIS — R0981 Nasal congestion: Secondary | ICD-10-CM | POA: Diagnosis not present

## 2015-06-22 DIAGNOSIS — E669 Obesity, unspecified: Secondary | ICD-10-CM | POA: Diagnosis not present

## 2015-06-22 DIAGNOSIS — F319 Bipolar disorder, unspecified: Secondary | ICD-10-CM | POA: Diagnosis not present

## 2015-06-22 NOTE — Discharge Instructions (Signed)
Your examination is consistent with cold sores (herpes simplex 1). Please wash hands frequently. Please keep your distance from others until this is resolved. See your primary physician for additional evaluation if not improving. Cold Sore A cold sore (fever blister) is a skin infection caused by a certain type of germ (virus). They are small sores filled with fluid that dry up and heal within 2 weeks. Cold sores form inside of the mouth or on the lips, gums, and other parts of the body. Cold sores can be easily passed (contagious) to other people. This can happen through close personal contact, such as kissing or sharing a drinking glass. HOME CARE  Only take medicine as told by your doctor. Do not use aspirin.  Use a cotton-tip swab to put creams or gels on your sores.  Do not touch sores or pick scabs. Wash your hands often. Do not touch your eyes without washing your hands first.  Avoid kissing, oral sex, and sharing personal items until the sores heal.  Put an ice pack on your sores for 10-15 minutes to ease discomfort.  Avoid hot, cold, or salty foods. Eat a soft, bland diet. Use a straw to drink if it helps lessen pain.  Keep sores clean and dry.  Avoid the sun and limit stress if these things cause you to have sores. Apply sunscreen on your lips if the sun causes cold sores. GET HELP IF:  You have a fever or lasting symptoms for more than 2-3 days.  You have a fever and your symptoms suddenly get worse.  You have yellow-white fluid (not clear) coming from the sores.  You have redness that is spreading.  You have pain or irritation in your eye.  You get sores on your genitals.  Your sores do not heal within 2 weeks.  You have a tough time fighting off sickness and infections (weakened immune system).  You get cold sores often. MAKE SURE YOU:   Understand these instructions.  Will watch your condition.  Will get help right away if you are not doing well or get  worse. Document Released: 04/22/2012 Document Reviewed: 04/22/2012 [facility] Patient Information 2015 [place], [place]. This information is not intended to replace advice given to you by your health care provider. Make sure you discuss any questions you have with your health care provider.

## 2015-06-22 NOTE — ED Provider Notes (Signed)
CSN: [id number]     Arrival date & time 06/22/15  1400 History   First MD Initiated Contact with Patient 06/22/15 1442     Chief Complaint  Patient presents with  . Mouth Lesions     (Consider location/radiation/quality/duration/timing/severity/associated sxs/prior Treatment) Patient is a 39 y.o. female presenting with mouth sores. The history is provided by the patient.  Mouth Lesions Location:  Upper lip, lower lip and buccal mucosa Buccal mucosa location:  L buccal mucosa and R buccal mucosa Quality:  Blistered and white Onset quality:  Gradual Severity:  Moderate Duration:  3 days Progression:  Worsening Chronicity:  New Context: change in diet   Context: not a change in medications   Relieved by:  Nothing Worsened by:  Nothing tried Ineffective treatments:  None tried Associated symptoms: congestion   Associated symptoms: no fever     Past Medical History  Diagnosis Date  . Endometriosis   . Polycystic ovarian disease   . Obesity   . Bipolar disorder    Past Surgical History  Procedure Laterality Date  . Abdominal hysterectomy     No family history on file. Social History  Substance Use Topics  . Smoking status: Current Every Day Smoker -- 1.00 packs/day    Types: Cigarettes  . Smokeless tobacco: None  . Alcohol Use: No   OB History    No data available     Review of Systems  Constitutional: Negative for fever.  HENT: Positive for congestion and mouth sores.       Allergies  Bee venom  Home Medications   Prior to Admission medications   Medication Sig Start Date End Date Taking? Authorizing Provider  clonazePAM (KLONOPIN) 1 MG tablet Take 1 mg by mouth 2 (two) times daily as needed for anxiety.   Yes Historical Provider, MD  ibuprofen (ADVIL,MOTRIN) 200 MG tablet Take 600 mg by mouth every 6 (six) hours as needed for moderate pain.   Yes Historical Provider, MD  lamoTRIgine (LAMICTAL) 100 MG tablet Take 100 mg by mouth 2 (two) times daily.      Yes Historical Provider, MD  metFORMIN (GLUCOPHAGE) 500 MG tablet Take 500 mg by mouth 2 (two) times daily with a meal.   Yes Historical Provider, MD  venlafaxine XR (EFFEXOR-XR) 75 MG 24 hr capsule Take 75 mg by mouth daily.   Yes Historical Provider, MD  albuterol (PROVENTIL HFA;VENTOLIN HFA) 108 (90 BASE) MCG/ACT inhaler Inhale into the lungs every 6 (six) hours as needed for wheezing or shortness of breath.    Historical Provider, MD   BP 148/92 mmHg  Pulse 98  Temp(Src) 98.7 F (37.1 C) (Oral)  Resp 16  SpO2 97% Physical Exam  Constitutional: She is oriented to person, place, and time. She appears well-developed and well-nourished.  Non-toxic appearance.  HENT:  Head: Normocephalic.  Right Ear: Tympanic membrane and external ear normal.  Left Ear: Tympanic membrane and external ear normal.  Lesions on mucosa of the lips and one of the posterior pharynx. Airway patent.  Eyes: EOM and lids are normal. Pupils are equal, round, and reactive to light.  Neck: Normal range of motion. Neck supple. Carotid bruit is not present.  Cardiovascular: Normal rate, regular rhythm, normal heart sounds, intact distal pulses and normal pulses.   Pulmonary/Chest: Breath sounds normal. No respiratory distress.  Abdominal: Soft. Bowel sounds are normal. There is no tenderness. There is no guarding.  Musculoskeletal: Normal range of motion.  Lymphadenopathy:  Head (right side): No submandibular adenopathy present.       Head (left side): No submandibular adenopathy present.    She has no cervical adenopathy.  Neurological: She is alert and oriented to person, place, and time. She has normal strength. No cranial nerve deficit or sensory deficit.  Skin: Skin is warm and dry.  Psychiatric: She has a normal mood and affect. Her speech is normal.  Nursing note and vitals reviewed.   ED Course  Procedures (including critical care time) Labs Review Labs Reviewed - No data to display  Imaging  Review No results found. I have personally reviewed and evaluated these images and lab results as part of my medical decision-making.   EKG Interpretation None      MDM Exam favors fever blisters or HS1. Discussed findings with pt. She will use campho fever blister therapy. Discussed safety precautions.   Final diagnoses:  Herpes simplex    *I have reviewed nursing notes, vital signs, and all appropriate lab and imaging results for this patient.[address], PA-C 06/24/15 1719  Raeford Razor, MD 06/29/15 1415

## 2015-06-22 NOTE — ED Notes (Signed)
Pt states sores to mouth first noticed 3 days ago. Pt states nothing new or any injuries. States she is taking Effexor and Lamictal for several years.

## 2015-10-17 ENCOUNTER — Emergency Department (HOSPITAL_COMMUNITY)
Admission: EM | Admit: 2015-10-17 | Discharge: 2015-10-17 | Disposition: A | Discharge status: Home / Self Care | Payer: 59 | Attending: Emergency Medicine | Admitting: Emergency Medicine

## 2015-10-17 ENCOUNTER — Encounter (HOSPITAL_COMMUNITY): Payer: Self-pay | Admitting: Emergency Medicine

## 2015-10-17 DIAGNOSIS — Z79899 Other long term (current) drug therapy: Secondary | ICD-10-CM | POA: Diagnosis not present

## 2015-10-17 DIAGNOSIS — Z8742 Personal history of other diseases of the female genital tract: Secondary | ICD-10-CM | POA: Insufficient documentation

## 2015-10-17 DIAGNOSIS — F319 Bipolar disorder, unspecified: Secondary | ICD-10-CM | POA: Insufficient documentation

## 2015-10-17 DIAGNOSIS — E669 Obesity, unspecified: Secondary | ICD-10-CM | POA: Insufficient documentation

## 2015-10-17 DIAGNOSIS — Y998 Other external cause status: Secondary | ICD-10-CM | POA: Insufficient documentation

## 2015-10-17 DIAGNOSIS — Y9389 Activity, other specified: Secondary | ICD-10-CM | POA: Diagnosis not present

## 2015-10-17 DIAGNOSIS — Z7984 Long term (current) use of oral hypoglycemic drugs: Secondary | ICD-10-CM | POA: Insufficient documentation

## 2015-10-17 DIAGNOSIS — S3210XD Unspecified fracture of sacrum, subsequent encounter for fracture with routine healing: Secondary | ICD-10-CM | POA: Insufficient documentation

## 2015-10-17 DIAGNOSIS — Y9289 Other specified places as the place of occurrence of the external cause: Secondary | ICD-10-CM | POA: Diagnosis not present

## 2015-10-17 DIAGNOSIS — T2124XA Burn of second degree of lower back, initial encounter: Secondary | ICD-10-CM | POA: Insufficient documentation

## 2015-10-17 DIAGNOSIS — F17210 Nicotine dependence, cigarettes, uncomplicated: Secondary | ICD-10-CM | POA: Diagnosis not present

## 2015-10-17 DIAGNOSIS — M533 Sacrococcygeal disorders, not elsewhere classified: Secondary | ICD-10-CM | POA: Diagnosis present

## 2015-10-17 DIAGNOSIS — X19XXXA Contact with other heat and hot substances, initial encounter: Secondary | ICD-10-CM | POA: Insufficient documentation

## 2015-10-17 DIAGNOSIS — T300 Burn of unspecified body region, unspecified degree: Secondary | ICD-10-CM

## 2015-10-17 DIAGNOSIS — M545 Low back pain: Secondary | ICD-10-CM | POA: Insufficient documentation

## 2015-10-17 MED ORDER — HYDROMORPHONE HCL 1 MG/ML IJ SOLN
1.0000 mg | Freq: Once | INTRAMUSCULAR | Status: AC
  Administered 2015-10-17: 1 mg via INTRAMUSCULAR
  Filled 2015-10-17: qty 1

## 2015-10-17 MED ORDER — OXYCODONE-ACETAMINOPHEN 5-325 MG PO TABS: 1.0000 | ORAL_TABLET | ORAL | Status: DC | PRN

## 2015-10-17 MED ORDER — SILVER SULFADIAZINE 1 % EX CREA
TOPICAL_CREAM | Freq: Once | CUTANEOUS | Status: AC
  Administered 2015-10-17: 17:00:00 via TOPICAL
  Filled 2015-10-17: qty 50

## 2015-10-17 MED ORDER — ONDANSETRON 8 MG PO TBDP
8.0000 mg | ORAL_TABLET | Freq: Once | ORAL | Status: AC
  Administered 2015-10-17: 8 mg via ORAL
  Filled 2015-10-17: qty 1

## 2015-10-17 NOTE — Discharge Instructions (Signed)
Burn Care Your skin is a natural barrier to infection. It is the largest organ of your body. Burns damage this natural protection. To help prevent infection, it is very important to follow your caregiver's instructions in the care of your burn. Burns are classified as:  First degree. There is only redness of the skin (erythema). No scarring is expected.  Second degree. There is blistering of the skin. Scarring may occur with deeper burns.  Third degree. All layers of the skin are injured, and scarring is expected. HOME CARE INSTRUCTIONS   Wash your hands well before changing your bandage.  Change your bandage as often as directed by your caregiver.  Remove the old bandage. If the bandage sticks, you may soak it off with cool, clean water.  Cleanse the burn thoroughly but gently with mild soap and water.  Pat the area dry with a clean, dry cloth.  Apply a thin layer of antibacterial cream to the burn.  Apply a clean bandage as instructed by your caregiver.  Keep the bandage as clean and dry as possible.  Elevate the affected area for the first 24 hours, then as instructed by your caregiver.  Only take over-the-counter or prescription medicines for pain, discomfort, or fever as directed by your caregiver. SEEK IMMEDIATE MEDICAL CARE IF:   You develop excessive pain.  You develop redness, tenderness, swelling, or red streaks near the burn.  The burned area develops yellowish-white fluid (pus) or a bad smell.  You have a fever. MAKE SURE YOU:   Understand these instructions.  Will watch your condition.  Will get help right away if you are not doing well or get worse.   This information is not intended to replace advice given to you by your health care provider. Make sure you discuss any questions you have with your health care provider.   Document Released: 10/22/2005 Document Revised: 01/14/2012 Document Reviewed: 03/14/2011 Elsevier Interactive Patient Education 2016  [employer]   Apply a thin layer of the burn cream to your burn site twice daily after washing the site with soap and water.  Keep covered until the blisters have popped and scabbed.  You may take the oxycodone prescribed for pain relief for your sacral fracture.  This will make you drowsy - do not drive within 4 hours of taking this medication.  Followup with Dr. Channing Muttersoy as recommended by Dr. Case.

## 2015-10-17 NOTE — ED Provider Notes (Signed)
CSN: [id number]     Arrival date & time 10/17/15  1547 History  By signing my name below, I, [name], attest that this documentation has been prepared under the direction and in the presence of Burgess AmorJulie Aubree Doody, PA-C. Electronically Signed: [name], ED Scribe. 10/17/2015. 5:00 PM.   Chief Complaint  Patient presents with  . Rash    back  . sacral pain    The history is provided by the patient. No language interpreter was used.    HPI Comments: Amy Reese is a 39 y.o. female who presents to the Emergency Department complaining of a continuation of her moderate sacral pain s/p a fall which occurred 5 days ago. [facility] film diagnosis of distal sacral fracture.  She followed up with ortho who recommended she see a neurosurgeon and is currently awaiting on the referral/appt. She denies weakness or numbness in her legs since her injury.  Denies urinary or fecal incontinence or retention.   Pt notes taking 3x BC Powders today which have provided no relief further noting having applied heat/ice with ice providing mild relief. She notes being given 20 percocet for pain management which she is out of further noting that when she took 2x percocet it provided a mild relief of her pain. Pt denies any known drug allergies.   Pt also complains of multiple blisters to her right lower back with onset earlier today. She denies any injury to the affected area but has been using a heating pad on her lower back.  She notes associated, mild, non-radiating, pain to the affected region. Pt denies any other associated symptoms at this time.   PCP: None   Past Medical History  Diagnosis Date  . Endometriosis   . Polycystic ovarian disease   . Obesity   . Bipolar disorder [facility](HCC)    Past Surgical History  Procedure Laterality Date  . Abdominal hysterectomy     History reviewed. No pertinent family history. Social History  Substance Use Topics  . Smoking status: Current  Every Day Smoker -- 1.00 packs/day    Types: Cigarettes  . Smokeless tobacco: None  . Alcohol Use: No   OB History    No data available     Review of Systems  Musculoskeletal: Positive for back pain.  Skin: Positive for rash.   Allergies  Bee venom  Home Medications   Prior to Admission medications   Medication Sig Start Date End Date Taking? Authorizing Provider  albuterol (PROVENTIL HFA;VENTOLIN HFA) 108 (90 BASE) MCG/ACT inhaler Inhale into the lungs every 6 (six) hours as needed for wheezing or shortness of breath.    Historical Provider, MD  clonazePAM (KLONOPIN) 1 MG tablet Take 1 mg by mouth 2 (two) times daily as needed for anxiety.    Historical Provider, MD  ibuprofen (ADVIL,MOTRIN) 200 MG tablet Take 600 mg by mouth every 6 (six) hours as needed for moderate pain.    Historical Provider, MD  lamoTRIgine (LAMICTAL) 100 MG tablet Take 100 mg by mouth 2 (two) times daily.      Historical Provider, MD  metFORMIN (GLUCOPHAGE) 500 MG tablet Take 500 mg by mouth 2 (two) times daily with a meal.    Historical Provider, MD  oxyCODONE-acetaminophen (PERCOCET/ROXICET) 5-325 MG tablet Take 1 tablet by mouth every 4 (four) hours as needed. 10/17/15   Burgess AmorJulie Janece Laidlaw, PA-C  venlafaxine XR (EFFEXOR-XR) 75 MG 24 hr capsule Take 75 mg by mouth daily.    Historical  Provider, MD   BP 150/90 mmHg  Pulse 88  Temp(Src) 98 F (36.7 C) (Oral)  Resp 16  Ht  (1.676 m)  Wt 163.295 kg  BMI 58.13 kg/m2  SpO2 98% Physical Exam  Constitutional: She is oriented to person, place, and time. She appears well-developed and well-nourished.  HENT:  Head: Normocephalic and atraumatic.  Mouth/Throat: No oropharyngeal exudate.  Neck: Normal range of motion. No tracheal deviation present.  Cardiovascular: Normal rate.   Pulmonary/Chest: Effort normal. No respiratory distress.  Musculoskeletal: She exhibits tenderness.  TTP of the midline sacrum without any visible trauma, bruising or swelling   Neurological: She is alert and oriented to person, place, and time.  Skin: Skin is warm and dry. She is not diaphoretic.  4 cm vertical line of erythema to her right flank with 1 cm vesicle and a second 2 cm bulla which are intact; no spreading erythema or red streaking  Psychiatric: She has a normal mood and affect. Her behavior is normal.  Nursing note and vitals reviewed.  ED Course  Procedures  DIAGNOSTIC STUDIES: Oxygen Saturation is 100% on RA, normal by my interpretation.    COORDINATION OF CARE: 4:58 PM Pt presents today due to pain associated with a fall as well as a multiple blisters to her right lower back. Discussed next steps with pt and she agreed to plan.   Labs Review Labs Reviewed - No data to display  Imaging Review No results found.   EKG Interpretation None      MDM   Final diagnoses:  Closed fracture of sacrum with routine healing, unspecified fracture morphology, subsequent encounter  Burn    Imaging from [facility] reviewed, no indication for repeating imaging today.  She was prescribed oxycodone for pain.  I suspect her blisters/skin redness is heating pad associated injury.  Silvadene applied, dressing.  Burn care instructions along with safe heat pad use discussed.  Advised f/u with neurosurgery per her orthopedics recommendation and referral.  Prn f/u anticipated.  I personally performed the services described in this documentation, which was scribed in my presence. The recorded information has been reviewed and is accurate.   Burgess Amor, PA-C 10/19/15 1318  Vanetta Mulders, MD 10/20/15 1321

## 2015-10-17 NOTE — ED Notes (Signed)
Larey SeatFell last Wed morning about 0600.  [facility] and here today for continued pain and rash to left back, blister noted.

## 2015-11-09 ENCOUNTER — Emergency Department (HOSPITAL_COMMUNITY)
Admission: EM | Admit: 2015-11-09 | Discharge: 2015-11-09 | Disposition: A | Discharge status: Home / Self Care | Payer: 59 | Attending: Emergency Medicine | Admitting: Emergency Medicine

## 2015-11-09 ENCOUNTER — Encounter (HOSPITAL_COMMUNITY): Payer: Self-pay | Admitting: *Deleted

## 2015-11-09 DIAGNOSIS — E669 Obesity, unspecified: Secondary | ICD-10-CM | POA: Diagnosis not present

## 2015-11-09 DIAGNOSIS — F319 Bipolar disorder, unspecified: Secondary | ICD-10-CM | POA: Insufficient documentation

## 2015-11-09 DIAGNOSIS — I10 Essential (primary) hypertension: Secondary | ICD-10-CM | POA: Insufficient documentation

## 2015-11-09 DIAGNOSIS — R030 Elevated blood-pressure reading, without diagnosis of hypertension: Secondary | ICD-10-CM | POA: Diagnosis present

## 2015-11-09 DIAGNOSIS — F17210 Nicotine dependence, cigarettes, uncomplicated: Secondary | ICD-10-CM | POA: Diagnosis not present

## 2015-11-09 DIAGNOSIS — Z79899 Other long term (current) drug therapy: Secondary | ICD-10-CM | POA: Insufficient documentation

## 2015-11-09 DIAGNOSIS — R51 Headache: Secondary | ICD-10-CM | POA: Diagnosis not present

## 2015-11-09 DIAGNOSIS — M25512 Pain in left shoulder: Secondary | ICD-10-CM | POA: Insufficient documentation

## 2015-11-09 DIAGNOSIS — Z8742 Personal history of other diseases of the female genital tract: Secondary | ICD-10-CM | POA: Diagnosis not present

## 2015-11-09 LAB — CBC WITH DIFFERENTIAL/PLATELET
BASOS ABS: 0 10*3/uL (ref 0.0–0.1)
Basophils Relative: 0 %
Eosinophils Absolute: 0 10*3/uL (ref 0.0–0.7)
Eosinophils Relative: 0 %
HCT: 44.2 % (ref 36.0–46.0)
HEMOGLOBIN: 14.8 g/dL (ref 12.0–15.0)
LYMPHS PCT: 21 %
Lymphs Abs: 2.8 10*3/uL (ref 0.7–4.0)
MCH: 31.8 pg (ref 26.0–34.0)
MCHC: 33.5 g/dL (ref 30.0–36.0)
MCV: 95.1 fL (ref 78.0–100.0)
MONO ABS: 1.3 10*3/uL — AB (ref 0.1–1.0)
Monocytes Relative: 10 %
NEUTROS PCT: 69 %
Neutro Abs: 9.3 10*3/uL — ABNORMAL HIGH (ref 1.7–7.7)
Platelets: 259 10*3/uL (ref 150–400)
RBC: 4.65 MIL/uL (ref 3.87–5.11)
RDW: 14.1 % (ref 11.5–15.5)
WBC: 13.5 10*3/uL — ABNORMAL HIGH (ref 4.0–10.5)

## 2015-11-09 LAB — BASIC METABOLIC PANEL
ANION GAP: 7 (ref 5–15)
BUN: 18 mg/dL (ref 6–20)
CHLORIDE: 101 mmol/L (ref 101–111)
CO2: 27 mmol/L (ref 22–32)
Calcium: 9.1 mg/dL (ref 8.9–10.3)
Creatinine, Ser: 0.62 mg/dL (ref 0.44–1.00)
GFR calc Af Amer: >60 mL/min (ref >60)
GFR calc non Af Amer: >60 mL/min (ref >60)
GLUCOSE: 240 mg/dL — AB (ref 65–99)
POTASSIUM: 3.9 mmol/L (ref 3.5–5.1)
Sodium: 135 mmol/L (ref 135–145)

## 2015-11-09 MED ORDER — ALBUTEROL SULFATE HFA 108 (90 BASE) MCG/ACT IN AERS
1.0000 | INHALATION_SPRAY | RESPIRATORY_TRACT | Status: DC | PRN
Start: 2015-11-09 — End: 2015-11-09
  Administered 2015-11-09: 1 via RESPIRATORY_TRACT
  Filled 2015-11-09: qty 6.7

## 2015-11-09 MED ORDER — AMLODIPINE BESYLATE 5 MG PO TABS: 5.0000 mg | ORAL_TABLET | Freq: Every day | ORAL | Status: DC

## 2015-11-09 MED ORDER — CLONIDINE HCL 0.2 MG PO TABS
0.2000 mg | ORAL_TABLET | Freq: Once | ORAL | Status: AC
  Administered 2015-11-09: 0.2 mg via ORAL
  Filled 2015-11-09: qty 1

## 2015-11-09 NOTE — ED Provider Notes (Signed)
CSN: [id number]     Arrival date & time 11/09/15  1110 History  By signing my name below, I, [name], attest that this documentation has been prepared under the direction and in the presence of Geoffery Lyonsouglas Jayven Naill, MD. Electronically Signed: [name], ED Scribe. 11/09/2015. 12:24 PM.   Chief Complaint  Patient presents with  . Hypertension   Patient is a 40 y.o. female presenting with hypertension. The history is provided by the patient. No language interpreter was used.  Hypertension This is a new problem. The current episode started 1 to 2 hours ago. The problem occurs constantly. The problem has been gradually worsening. Associated symptoms include headaches. Pertinent negatives include no chest pain, no abdominal pain and no shortness of breath. She has tried nothing for the symptoms.  HPI Comments: Amy ChandlerJennifer Reese is a 40 y.o. Female with a hx of endometriosis and PCOS who presents to the Emergency Department complaining of increased BP onset PTA. Pt states that she came from the chiropractor's office and her BP is currently 164/116. She reports associated chest "heaviness," intermittent dizziness, headache, lightheadedness, and aching left shoulder pain. She states that she is in the process of getting a PCP. She states that her BP has always been high, but has never been formally treated for HTN. She denies chest pain or SOB.   Past Medical History  Diagnosis Date  . Endometriosis   . Polycystic ovarian disease   . Obesity   . Bipolar disorder [facility](HCC)    Past Surgical History  Procedure Laterality Date  . Abdominal hysterectomy     No family history on file. Social History  Substance Use Topics  . Smoking status: Current Every Day Smoker -- 1.00 packs/day    Types: Cigarettes  . Smokeless tobacco: None  . Alcohol Use: No   OB History    No data available     Review of Systems  Respiratory: Negative for shortness of breath.   Cardiovascular: Negative for chest pain.   Gastrointestinal: Negative for abdominal pain.  Musculoskeletal: Positive for arthralgias.  Neurological: Positive for dizziness, light-headedness and headaches. Negative for weakness.  All other systems reviewed and are negative.  Allergies  Bee venom  Home Medications   Prior to Admission medications   Medication Sig Start Date End Date Taking? Authorizing Provider  albuterol (PROVENTIL HFA;VENTOLIN HFA) 108 (90 BASE) MCG/ACT inhaler Inhale into the lungs every 6 (six) hours as needed for wheezing or shortness of breath.    Historical Provider, MD  clonazePAM (KLONOPIN) 1 MG tablet Take 1 mg by mouth 2 (two) times daily as needed for anxiety.    Historical Provider, MD  ibuprofen (ADVIL,MOTRIN) 200 MG tablet Take 600 mg by mouth every 6 (six) hours as needed for moderate pain.    Historical Provider, MD  lamoTRIgine (LAMICTAL) 100 MG tablet Take 100 mg by mouth 2 (two) times daily.      Historical Provider, MD  metFORMIN (GLUCOPHAGE) 500 MG tablet Take 500 mg by mouth 2 (two) times daily with a meal.    Historical Provider, MD  oxyCODONE-acetaminophen (PERCOCET/ROXICET) 5-325 MG tablet Take 1 tablet by mouth every 4 (four) hours as needed. 10/17/15   Burgess AmorJulie Idol, PA-C  venlafaxine XR (EFFEXOR-XR) 75 MG 24 hr capsule Take 75 mg by mouth daily.    Historical Provider, MD   BP 164/116 mmHg  Pulse 88  Temp(Src) 97.7 F (36.5 C) (Oral)  Resp 18  Ht 5\' 6"  (1.676 m)  Wt 347 lb (157.398  kg)  BMI 56.03 kg/m2  SpO2 98% Physical Exam  Constitutional: She is oriented to person, place, and time. She appears well-developed and well-nourished. No distress.  HENT:  Head: Normocephalic and atraumatic.  Eyes: EOM are normal. Pupils are equal, round, and reactive to light.  Neck: Normal range of motion.  Cardiovascular: Normal rate, regular rhythm and normal heart sounds.   No murmur heard. Pulmonary/Chest: Effort normal and breath sounds normal. No respiratory distress. She has no wheezes. She  has no rales.  Abdominal: Soft. She exhibits no distension. There is no tenderness.  Musculoskeletal: Normal range of motion.  Neurological: She is alert and oriented to person, place, and time.  Skin: Skin is warm and dry.  Psychiatric: She has a normal mood and affect. Judgment normal.  Nursing note and vitals reviewed.   ED Course  Procedures (including critical care time) DIAGNOSTIC STUDIES: Oxygen Saturation is 98% on RA, normal by my interpretation.    COORDINATION OF CARE: 11:30 AM-Discussed treatment plan which includes labs and EKG with pt at bedside and pt agreed to plan.   Labs Review Labs Reviewed  CBC WITH DIFFERENTIAL/PLATELET - Abnormal; Notable for the following:    WBC 13.5 (*)    Neutro Abs 9.3 (*)    Monocytes Absolute 1.3 (*)    All other components within normal limits  BASIC METABOLIC PANEL - Abnormal; Notable for the following:    Glucose, Bld 240 (*)    All other components within normal limits    Imaging Review No results found. I have personally reviewed and evaluated these images and lab results as part of my medical decision-making.   EKG Interpretation   Date/Time:  Wednesday November 09 2015 11:26:47 EST Ventricular Rate:  90 PR Interval:  180 QRS Duration: 107 QT Interval:  364 QTC Calculation: 445 R Axis:   68 Text Interpretation:  Sinus rhythm Consider left atrial enlargement  Abnormal inferior Q waves No significant change since 08-13-2013 Confirmed  by Raiven Belizaire  MD, Niklaus Mamaril ([id number]) on 11/09/2015 11:38:56 AM      MDM   Final diagnoses:  None    Patient presents with elevated blood pressure. She was referred here from the chiropractor's office. Her blood pressure was initially elevated. She was given clonidine for this and it is improved. Her workup is unremarkable. She will be discharged on Norvasc and advised follow-up with a primary doctor. She is also counseled on diet, weight loss, and exercise as a primary means to lower her blood  pressure.  I personally performed the services described in this documentation, which was scribed in my presence. The recorded information has been reviewed and is accurate.       Geoffery Lyons, MD 11/09/15 1255

## 2015-11-09 NOTE — ED Notes (Signed)
MD at bedside. 

## 2015-11-09 NOTE — Discharge Instructions (Signed)
Norvasc as prescribed.  Keep a record of your blood pressures for the next several weeks to take with you to your next doctor's appointment.  Return to the ER if your symptoms significantly worsen or change.   Hypertension Hypertension, commonly called high blood pressure, is when the force of blood pumping through your arteries is too strong. Your arteries are the blood vessels that carry blood from your heart throughout your body. A blood pressure reading consists of a higher number over a lower number, such as 110/72. The higher number (systolic) is the pressure inside your arteries when your heart pumps. The lower number (diastolic) is the pressure inside your arteries when your heart relaxes. Ideally you want your blood pressure below 120/80. Hypertension forces your heart to work harder to pump blood. Your arteries may become narrow or stiff. Having untreated or uncontrolled hypertension can cause heart attack, stroke, kidney disease, and other problems. RISK FACTORS Some risk factors for high blood pressure are controllable. Others are not.  Risk factors you cannot control include:   Race. You may be at higher risk if you are African American.  Age. Risk increases with age.  Gender. Men are at higher risk than women before age 38 years. After age 38, women are at higher risk than men. Risk factors you can control include:  Not getting enough exercise or physical activity.  Being overweight.  Getting too much fat, sugar, calories, or salt in your diet.  Drinking too much alcohol. SIGNS AND SYMPTOMS Hypertension does not usually cause signs or symptoms. Extremely high blood pressure (hypertensive crisis) may cause headache, anxiety, shortness of breath, and nosebleed. DIAGNOSIS To check if you have hypertension, your health care provider will measure your blood pressure while you are seated, with your arm held at the level of your heart. It should be measured at least twice using  the same arm. Certain conditions can cause a difference in blood pressure between your right and left arms. A blood pressure reading that is higher than normal on one occasion does not mean that you need treatment. If it is not clear whether you have high blood pressure, you may be asked to return on a different day to have your blood pressure checked again. Or, you may be asked to monitor your blood pressure at home for 1 or more weeks. TREATMENT Treating high blood pressure includes making lifestyle changes and possibly taking medicine. Living a healthy lifestyle can help lower high blood pressure. You may need to change some of your habits. Lifestyle changes may include:  Following the DASH diet. This diet is high in fruits, vegetables, and whole grains. It is low in salt, red meat, and added sugars.  Keep your sodium intake below 2,300 mg per day.  Getting at least 30-45 minutes of aerobic exercise at least 4 times per week.  Losing weight if necessary.  Not smoking.  Limiting alcoholic beverages.  Learning ways to reduce stress. Your health care provider may prescribe medicine if lifestyle changes are not enough to get your blood pressure under control, and if one of the following is true:  You are 2518-38 years of age and your systolic blood pressure is above 140.  You are 38 years of age or older, and your systolic blood pressure is above 150.  Your diastolic blood pressure is above 90.  You have diabetes, and your systolic blood pressure is over 140 or your diastolic blood pressure is over 90.  You have kidney disease  and your blood pressure is above 140/90.  You have heart disease and your blood pressure is above 140/90. Your personal target blood pressure may vary depending on your medical conditions, your age, and other factors. HOME CARE INSTRUCTIONS  Have your blood pressure rechecked as directed by your health care provider.   Take medicines only as directed by your  health care provider. Follow the directions carefully. Blood pressure medicines must be taken as prescribed. The medicine does not work as well when you skip doses. Skipping doses also puts you at risk for problems.  Do not smoke.   Monitor your blood pressure at home as directed by your health care provider. SEEK MEDICAL CARE IF:   You think you are having a reaction to medicines taken.  You have recurrent headaches or feel dizzy.  You have swelling in your ankles.  You have trouble with your vision. SEEK IMMEDIATE MEDICAL CARE IF:  You develop a severe headache or confusion.  You have unusual weakness, numbness, or feel faint.  You have severe chest or abdominal pain.  You vomit repeatedly.  You have trouble breathing. MAKE SURE YOU:   Understand these instructions.  Will watch your condition.  Will get help right away if you are not doing well or get worse.   This information is not intended to replace advice given to you by your health care provider. Make sure you discuss any questions you have with your health care provider.   Document Released: 10/22/2005 Document Revised: 03/08/2015 Document Reviewed: 08/14/2013 Elsevier Interactive Patient Education [employer].

## 2015-11-09 NOTE — ED Notes (Signed)
Pt comes in from chiropractors office for HTN. Pt states she has had intermittent dizziness, and left shoulder pain (pt states she had an adjustment last night). Pt has mild HA and denies weakness on either side.

## 2015-12-08 ENCOUNTER — Emergency Department (HOSPITAL_COMMUNITY)
Admission: EM | Admit: 2015-12-08 | Discharge: 2015-12-08 | Disposition: A | Discharge status: Home / Self Care | Payer: 59 | Attending: Emergency Medicine | Admitting: Emergency Medicine

## 2015-12-08 ENCOUNTER — Encounter (HOSPITAL_COMMUNITY): Payer: Self-pay | Admitting: *Deleted

## 2015-12-08 DIAGNOSIS — E669 Obesity, unspecified: Secondary | ICD-10-CM | POA: Diagnosis not present

## 2015-12-08 DIAGNOSIS — Z7984 Long term (current) use of oral hypoglycemic drugs: Secondary | ICD-10-CM | POA: Insufficient documentation

## 2015-12-08 DIAGNOSIS — N611 Abscess of the breast and nipple: Secondary | ICD-10-CM | POA: Insufficient documentation

## 2015-12-08 DIAGNOSIS — I10 Essential (primary) hypertension: Secondary | ICD-10-CM | POA: Diagnosis not present

## 2015-12-08 DIAGNOSIS — F319 Bipolar disorder, unspecified: Secondary | ICD-10-CM | POA: Insufficient documentation

## 2015-12-08 DIAGNOSIS — R000 Tachycardia, unspecified: Secondary | ICD-10-CM | POA: Diagnosis not present

## 2015-12-08 DIAGNOSIS — Z79899 Other long term (current) drug therapy: Secondary | ICD-10-CM | POA: Diagnosis not present

## 2015-12-08 DIAGNOSIS — F17210 Nicotine dependence, cigarettes, uncomplicated: Secondary | ICD-10-CM | POA: Insufficient documentation

## 2015-12-08 MED ORDER — DOXYCYCLINE HYCLATE 100 MG PO TABS
100.0000 mg | ORAL_TABLET | Freq: Once | ORAL | Status: AC
  Administered 2015-12-08: 100 mg via ORAL
  Filled 2015-12-08: qty 1

## 2015-12-08 MED ORDER — HYDROCODONE-ACETAMINOPHEN 5-325 MG PO TABS: 1.0000 | ORAL_TABLET | ORAL | Status: DC | PRN

## 2015-12-08 MED ORDER — AMLODIPINE BESYLATE 5 MG PO TABS: 5.0000 mg | ORAL_TABLET | Freq: Every day | ORAL | Status: DC

## 2015-12-08 MED ORDER — DOXYCYCLINE HYCLATE 100 MG PO CAPS: 100.0000 mg | ORAL_CAPSULE | Freq: Two times a day (BID) | ORAL | Status: DC

## 2015-12-08 NOTE — ED Notes (Signed)
Pt reporting abscess on left breast.  Denies any associated symptoms.

## 2015-12-08 NOTE — Discharge Instructions (Signed)
Please use warm tub soaks with Epsom salt daily for 15-20 minutes. Use doxycycline 2 times daily with food. May use Tylenol or ibuprofen for mild pain, use Norco for more severe pain. Please see Dr. Lovell Sheehan for surgical evaluation if these abscess areas are increasing or worsening. Your blood pressure is elevated at 143/102. It is important that she see her primary physician concerning this as soon as possible.

## 2015-12-08 NOTE — ED Provider Notes (Signed)
CSN: [id number]     Arrival date & time 12/08/15  2031 History   First MD Initiated Contact with Patient 12/08/15 2134     Chief Complaint  Patient presents with  . Abscess     (Consider location/radiation/quality/duration/timing/severity/associated sxs/prior Treatment) Patient is a 40 y.o. female presenting with abscess. The history is provided by the patient.  Abscess Abscess location: left and right  breast. Abscess quality: draining, painful and warmth   Duration:  2 months Progression:  Worsening Pain details:    Quality:  Throbbing   Timing:  Intermittent   Progression:  Worsening Chronicity:  Recurrent Context: not diabetes and not immunosuppression   Relieved by:  Nothing Ineffective treatments:  None tried Associated symptoms: no fever, no nausea and no vomiting   Risk factors: prior abscess   Risk factors: no hx of MRSA     Past Medical History  Diagnosis Date  . Endometriosis   . Polycystic ovarian disease   . Obesity   . Bipolar disorder [facility])    Past Surgical History  Procedure Laterality Date  . Abdominal hysterectomy     History reviewed. No pertinent family history. Social History  Substance Use Topics  . Smoking status: Current Every Day Smoker -- 1.00 packs/day    Types: Cigarettes  . Smokeless tobacco: None  . Alcohol Use: No   OB History    No data available     Review of Systems  Constitutional: Negative for fever.  Gastrointestinal: Negative for nausea and vomiting.  Skin: Positive for wound.  All other systems reviewed and are negative.     Allergies  Bee venom  Home Medications   Prior to Admission medications   Medication Sig Start Date End Date Taking? Authorizing Provider  albuterol (PROVENTIL HFA;VENTOLIN HFA) 108 (90 BASE) MCG/ACT inhaler Inhale into the lungs every 6 (six) hours as needed for wheezing or shortness of breath. Reported on 11/09/2015    Historical Provider, MD  amLODipine (NORVASC) 5 MG tablet Take 1 tablet  (5 mg total) by mouth daily. 11/09/15   Geoffery Lyons, MD  clonazePAM (KLONOPIN) 1 MG tablet Take 1 mg by mouth 2 (two) times daily as needed for anxiety.    Historical Provider, MD  ibuprofen (ADVIL,MOTRIN) 200 MG tablet Take 600 mg by mouth every 6 (six) hours as needed for moderate pain.    Historical Provider, MD  lamoTRIgine (LAMICTAL) 100 MG tablet Take 100 mg by mouth 2 (two) times daily.      Historical Provider, MD  lisdexamfetamine (VYVANSE) 30 MG capsule Take 30 mg by mouth daily.    Historical Provider, MD  metFORMIN (GLUCOPHAGE) 500 MG tablet Take 500 mg by mouth 2 (two) times daily with a meal. Reported on 11/09/2015    Historical Provider, MD  oxyCODONE-acetaminophen (PERCOCET/ROXICET) 5-325 MG tablet Take 1 tablet by mouth every 4 (four) hours as needed. Patient not taking: Reported on 11/09/2015 10/17/15   Burgess Amor, PA-C  venlafaxine XR (EFFEXOR-XR) 75 MG 24 hr capsule Take 75 mg by mouth daily.    Historical Provider, MD   BP 143/102 mmHg  Pulse 103  Temp(Src) 97.9 F (36.6 C) (Oral)  Resp 18  Ht  (1.676 m)  Wt 157.852 kg  BMI 56.20 kg/m2  SpO2 96% Physical Exam  Constitutional: She is oriented to person, place, and time. She appears well-developed and well-nourished.  Non-toxic appearance.  HENT:  Head: Normocephalic.  Right Ear: Tympanic membrane and external ear normal.  Left Ear: Tympanic  membrane and external ear normal.  Eyes: EOM and lids are normal. Pupils are equal, round, and reactive to light.  Neck: Normal range of motion. Neck supple. Carotid bruit is not present.  Cardiovascular: Regular rhythm, normal heart sounds, intact distal pulses and normal pulses.  Tachycardia present.   Pulmonary/Chest: Breath sounds normal. No respiratory distress.  Abdominal: Soft. Bowel sounds are normal. There is no tenderness. There is no guarding.  Genitourinary:  Chaperone present during the examination. There is a small abscess of the right breast that is open, and  minimal drainage present. There no red streaks appreciated. There is some tenderness to palpation. There is no involvement of the nipple, and there is no drainage from the nipple area.  There is a red raised tender abscess area of the left breast at the edge of the [place]. There no red streaks appreciated. Is no drainage from this area. It is tender to palpation. There is no nipple involvement. There is no drainage from the nipple area.  Musculoskeletal: Normal range of motion.  Lymphadenopathy:       Head (right side): No submandibular adenopathy present.       Head (left side): No submandibular adenopathy present.    She has no cervical adenopathy.  Neurological: She is alert and oriented to person, place, and time. She has normal strength. No cranial nerve deficit or sensory deficit.  Skin: Skin is warm and dry.  Psychiatric: She has a normal mood and affect. Her speech is normal.  Nursing note and vitals reviewed.   ED Course  Procedures (including critical care time) Labs Review Labs Reviewed - No data to display  Imaging Review No results found. I have personally reviewed and evaluated these images and lab results as part of my medical decision-making.   EKG Interpretation None      MDM  The patient has an abscess area of the right and left breast. The area on the right has an opening with some mild drainage present. There is no red streaks appreciated. The temperature is well within normal limits at this time. The patient is asked to use warm tub soaks on. The patient is also asked to use doxycycline 2 times daily. The patient is referred to Dr. Lovell Sheehan for surgical evaluation if the abscess is not improving.  The patient's blood pressure is elevated at 143/102. The patient's previous visit on January 4 also revealed an elevated blood pressure. The patient states she is attempting to obtain a primary physician, but is running into opposition and does not have one yet. The  patient's Norvasc prescription has been renewed. I have asked the patient to continue her pursuit of a primary physician, and even suggested that she may want to pursue another office since this one is having difficulty with her request.    Final diagnoses:  Abscess of breast  Essential hypertension    *I have reviewed nursing notes, vital signs, and all appropriate lab and imaging results for this patient.[address], PA-C 12/08/15 2210  Eber Hong, MD 12/08/15 [phone]

## 2016-02-21 ENCOUNTER — Encounter (HOSPITAL_COMMUNITY): Payer: Self-pay

## 2016-02-21 ENCOUNTER — Emergency Department (HOSPITAL_COMMUNITY)
Admission: EM | Admit: 2016-02-21 | Discharge: 2016-02-21 | Disposition: A | Discharge status: Home / Self Care | Payer: 59 | Attending: Emergency Medicine | Admitting: Emergency Medicine

## 2016-02-21 ENCOUNTER — Emergency Department (HOSPITAL_COMMUNITY): Payer: 59

## 2016-02-21 DIAGNOSIS — Z79899 Other long term (current) drug therapy: Secondary | ICD-10-CM | POA: Diagnosis not present

## 2016-02-21 DIAGNOSIS — F319 Bipolar disorder, unspecified: Secondary | ICD-10-CM | POA: Diagnosis not present

## 2016-02-21 DIAGNOSIS — I10 Essential (primary) hypertension: Secondary | ICD-10-CM

## 2016-02-21 DIAGNOSIS — E669 Obesity, unspecified: Secondary | ICD-10-CM | POA: Diagnosis not present

## 2016-02-21 DIAGNOSIS — R079 Chest pain, unspecified: Secondary | ICD-10-CM | POA: Insufficient documentation

## 2016-02-21 DIAGNOSIS — F17210 Nicotine dependence, cigarettes, uncomplicated: Secondary | ICD-10-CM | POA: Diagnosis not present

## 2016-02-21 LAB — CBC WITH DIFFERENTIAL/PLATELET
Basophils Absolute: 0 10*3/uL (ref 0.0–0.1)
Basophils Relative: 0 %
Eosinophils Absolute: 0.1 10*3/uL (ref 0.0–0.7)
Eosinophils Relative: 2 %
HEMATOCRIT: 44.1 % (ref 36.0–46.0)
HEMOGLOBIN: 14 g/dL (ref 12.0–15.0)
LYMPHS ABS: 2.2 10*3/uL (ref 0.7–4.0)
Lymphocytes Relative: 29 %
MCH: 30.8 pg (ref 26.0–34.0)
MCHC: 31.7 g/dL (ref 30.0–36.0)
MCV: 97.1 fL (ref 78.0–100.0)
Monocytes Absolute: 0.7 10*3/uL (ref 0.1–1.0)
Monocytes Relative: 9 %
NEUTROS ABS: 4.6 10*3/uL (ref 1.7–7.7)
NEUTROS PCT: 60 %
Platelets: 246 10*3/uL (ref 150–400)
RBC: 4.54 MIL/uL (ref 3.87–5.11)
RDW: 13.7 % (ref 11.5–15.5)
WBC: 7.7 10*3/uL (ref 4.0–10.5)

## 2016-02-21 LAB — BASIC METABOLIC PANEL
Anion gap: 8 (ref 5–15)
BUN: 14 mg/dL (ref 6–20)
CHLORIDE: 101 mmol/L (ref 101–111)
CO2: 30 mmol/L (ref 22–32)
CREATININE: 0.83 mg/dL (ref 0.44–1.00)
Calcium: 8.7 mg/dL — ABNORMAL LOW (ref 8.9–10.3)
GFR calc non Af Amer: >60 mL/min (ref >60)
Glucose, Bld: 265 mg/dL — ABNORMAL HIGH (ref 65–99)
POTASSIUM: 4.5 mmol/L (ref 3.5–5.1)
Sodium: 139 mmol/L (ref 135–145)

## 2016-02-21 LAB — TROPONIN I

## 2016-02-21 MED ORDER — AMLODIPINE BESYLATE 5 MG PO TABS: 5.0000 mg | ORAL_TABLET | Freq: Every day | ORAL | Status: DC

## 2016-02-21 MED ORDER — METFORMIN HCL 500 MG PO TABS
500.0000 mg | ORAL_TABLET | Freq: Two times a day (BID) | ORAL | Status: DC
Start: 2016-02-21 — End: 2017-06-03

## 2016-02-21 NOTE — Discharge Instructions (Signed)
You must get primary care follow-up. Medications for blood pressure and diabetes. Try to lose some weight. Consider walking daily.

## 2016-02-21 NOTE — Progress Notes (Signed)
Silver necklace with cross charm removed prior to imaging and placed in Pts purse per Pt request.

## 2016-02-21 NOTE — ED Notes (Signed)
EDP at bedside  

## 2016-02-21 NOTE — ED Notes (Signed)
Pt alert and comfortable. Denies CP, SOB or dizziness. VSS.

## 2016-02-21 NOTE — ED Notes (Signed)
Pt complain of heaviness to center of chest that started about two hours ago. States she is more nervous than anything. Pt tearful.  [facility]

## 2016-02-21 NOTE — ED Provider Notes (Signed)
CSN: [id number]     Arrival date & time 02/21/16  1656 History   First MD Initiated Contact with Patient 02/21/16 1702     Chief Complaint  Patient presents with  . Chest Pain     (Consider location/radiation/quality/duration/timing/severity/associated sxs/prior Treatment) HPI..... Patient complains of pain under left breast approximately 2 hours ago described as a dullness without radiation. No dyspnea, diaphoresis, nausea. She has not been taking her medication lately. She smokes cigarettes. Mother had a cardiac stent and age 28. Severity of pain is mild-to-moderate.  Past Medical History  Diagnosis Date  . Endometriosis   . Polycystic ovarian disease   . Obesity   . Bipolar disorder [facility])    Past Surgical History  Procedure Laterality Date  . Abdominal hysterectomy     No family history on file. Social History  Substance Use Topics  . Smoking status: Current Every Day Smoker -- 1.00 packs/day    Types: Cigarettes  . Smokeless tobacco: None  . Alcohol Use: Yes   OB History    No data available     Review of Systems    Allergies  Bee venom  Home Medications   Prior to Admission medications   Medication Sig Start Date End Date Taking? Authorizing Provider  lamoTRIgine (LAMICTAL) 100 MG tablet Take 100 mg by mouth 2 (two) times daily.     Yes Historical Provider, MD  venlafaxine XR (EFFEXOR-XR) 75 MG 24 hr capsule Take 75 mg by mouth daily.   Yes Historical Provider, MD  amLODipine (NORVASC) 5 MG tablet Take 1 tablet (5 mg total) by mouth daily. 02/21/16   Donnetta Hutching, MD  clonazePAM (KLONOPIN) 1 MG tablet Take 1 mg by mouth 2 (two) times daily as needed for anxiety.    Historical Provider, MD  doxycycline (VIBRAMYCIN) 100 MG capsule Take 1 capsule (100 mg total) by mouth 2 (two) times daily. Patient not taking: Reported on 02/21/2016 12/08/15   Ivery Quale, PA-C  HYDROcodone-acetaminophen (NORCO/VICODIN) 5-325 MG tablet Take 1-2 tablets by mouth every 4 (four) hours  as needed. Patient not taking: Reported on 02/21/2016 12/08/15   Ivery Quale, PA-C  ibuprofen (ADVIL,MOTRIN) 200 MG tablet Take 600 mg by mouth every 6 (six) hours as needed for moderate pain.    Historical Provider, MD  metFORMIN (GLUCOPHAGE) 500 MG tablet Take 1 tablet (500 mg total) by mouth 2 (two) times daily with a meal. 02/21/16   Donnetta Hutching, MD  oxyCODONE-acetaminophen (PERCOCET/ROXICET) 5-325 MG tablet Take 1 tablet by mouth every 4 (four) hours as needed. Patient not taking: Reported on 11/09/2015 10/17/15   Burgess Amor, PA-C   BP 148/94 mmHg  Pulse 93  Temp(Src) 97.9 F (36.6 C) (Oral)  Resp 16  Ht  (1.676 m)  Wt 350 lb (158.759 kg)  BMI 56.52 kg/m2  SpO2 97% Physical Exam  Constitutional: She is oriented to person, place, and time.  Obese, no acute distress  HENT:  Head: Normocephalic and atraumatic.  Eyes: Conjunctivae and EOM are normal. Pupils are equal, round, and reactive to light.  Neck: Normal range of motion. Neck supple.  Cardiovascular: Normal rate and regular rhythm.   Pulmonary/Chest: Effort normal and breath sounds normal.  Abdominal: Soft. Bowel sounds are normal.  Musculoskeletal: Normal range of motion.  Neurological: She is alert and oriented to person, place, and time.  Skin: Skin is warm and dry.  Psychiatric: She has a normal mood and affect. Her behavior is normal.  Nursing note and vitals reviewed.  ED Course  Procedures (including critical care time) Labs Review Labs Reviewed  BASIC METABOLIC PANEL - Abnormal; Notable for the following:    Glucose, Bld 265 (*)    Calcium 8.7 (*)    All other components within normal limits  CBC WITH DIFFERENTIAL/PLATELET  TROPONIN I    Imaging Review Dg Chest 2 View  02/21/2016  CLINICAL DATA:  Chest heaviness started about 2 hours ago. EXAM: CHEST  2 VIEW COMPARISON:  12/05/2013. FINDINGS: The lungs are clear wiithout focal pneumonia, edema, pneumothorax or pleural effusion. The cardiopericardial  silhouette is within normal limits for size. The visualized bony structures of the thorax are intact. Telemetry leads overlie the chest. IMPRESSION: No active cardiopulmonary disease. Electronically Signed   By: Kennith CenterEric  Mansell M.D.   On: 02/21/2016 18:00   I have personally reviewed and evaluated these images and lab results as part of my medical decision-making.   EKG Interpretation   Date/Time:  Tuesday February 21 2016 17:06:33 EDT Ventricular Rate:  88 PR Interval:  184 QRS Duration: 103 QT Interval:  364 QTC Calculation: 440 R Axis:   50 Text Interpretation:  Sinus rhythm Consider left atrial enlargement  Confirmed by Eward Rutigliano  MD, Damare Serano ([id number]) on 02/21/2016 7:05:18 PM      MDM   Final diagnoses:  Chest pain, unspecified chest pain type  Essential hypertension    Screening labs show a glucose of 265. She has not been taking her metformin. Chest x-ray negative. Troponin negative. EKG shows no ST segment changes. Patient is feeling better discharge. Discharge medications metformin 500 mg twice a day and Norvasc 5 mg daily.    Donnetta HutchingBrian Ronny Ruddell, MD 02/21/16 2226

## 2016-07-31 ENCOUNTER — Other Ambulatory Visit (HOSPITAL_COMMUNITY): Payer: Self-pay | Admitting: Internal Medicine

## 2016-07-31 DIAGNOSIS — R229 Localized swelling, mass and lump, unspecified: Principal | ICD-10-CM

## 2016-07-31 DIAGNOSIS — IMO0002 Reserved for concepts with insufficient information to code with codable children: Secondary | ICD-10-CM

## 2016-08-07 ENCOUNTER — Ambulatory Visit (HOSPITAL_COMMUNITY)
Admission: RE | Admit: 2016-08-07 | Discharge: 2016-08-07 | Disposition: A | Discharge status: Home / Self Care | Payer: 59 | Source: Ambulatory Visit | Attending: Internal Medicine | Admitting: Internal Medicine

## 2016-08-07 DIAGNOSIS — N63 Unspecified lump in unspecified breast: Secondary | ICD-10-CM | POA: Diagnosis not present

## 2016-08-07 DIAGNOSIS — R229 Localized swelling, mass and lump, unspecified: Secondary | ICD-10-CM

## 2016-08-07 DIAGNOSIS — IMO0002 Reserved for concepts with insufficient information to code with codable children: Secondary | ICD-10-CM

## 2016-08-07 DIAGNOSIS — N644 Mastodynia: Secondary | ICD-10-CM | POA: Diagnosis present

## 2016-08-30 ENCOUNTER — Encounter (HOSPITAL_COMMUNITY): Payer: Self-pay | Admitting: Emergency Medicine

## 2016-08-30 ENCOUNTER — Ambulatory Visit (HOSPITAL_COMMUNITY)
Admission: EM | Admit: 2016-08-30 | Discharge: 2016-08-30 | Disposition: A | Discharge status: Home / Self Care | Payer: 59 | Attending: Internal Medicine | Admitting: Internal Medicine

## 2016-08-30 DIAGNOSIS — L732 Hidradenitis suppurativa: Secondary | ICD-10-CM | POA: Diagnosis not present

## 2016-08-30 DIAGNOSIS — N611 Abscess of the breast and nipple: Secondary | ICD-10-CM

## 2016-08-30 MED ORDER — TRAMADOL HCL 50 MG PO TABS: 50.0000 mg | ORAL_TABLET | Freq: Four times a day (QID) | ORAL | 0 refills | Status: DC | PRN

## 2016-08-30 MED ORDER — SULFAMETHOXAZOLE-TRIMETHOPRIM 800-160 MG PO TABS: 1.0000 | ORAL_TABLET | Freq: Two times a day (BID) | ORAL | 0 refills | Status: AC

## 2016-08-30 NOTE — Discharge Instructions (Addendum)
Wash wound with soap/water 1-2x daily, apply antibiotic ointment/bandage.  Prescription for trimethoprim/sulfa [facility]; printed prescription for tramadol for pain.  Recheck for increasing redness/swelling/pain/drainage or new fever >100.5.  Anticipate gradual improvement in pain/swelling/drainage over the next several days; healing with scar over the next 2-3 weeks.  Followup with dermatologist.

## 2016-08-30 NOTE — ED Provider Notes (Signed)
[facility]    CSN: [id number] Arrival date & time: 08/30/16  1110     History   Chief Complaint Chief Complaint  Patient presents with  . Abscess    HPI Amy Reese is a 40 y.o. female. In the last few months she started taking some hormonal treatments, and has developed breast abscesses. Currently, for about a month she has had a lesion under the left breast. In the last week this is become very sore, with a lot of pressure sensation, scant drainage, redness and swelling. No fever, no malaise. Lots of life stress, having some headaches and nausea.  HPI  Past Medical History:  Diagnosis Date  . Bipolar disorder (HCC)   . Endometriosis   . Obesity   . Polycystic ovarian disease     Past Surgical History:  Procedure Laterality Date  . ABDOMINAL HYSTERECTOMY       Home Medications    Prior to Admission medications   Medication Sig Start Date End Date Taking? Authorizing Provider  amLODipine (NORVASC) 5 MG tablet Take 1 tablet (5 mg total) by mouth daily. Patient taking differently: Take 10 mg by mouth daily.  02/21/16  Yes Donnetta HutchingBrian Cook, MD  clonazePAM (KLONOPIN) 1 MG tablet Take 1 mg by mouth 2 (two) times daily as needed for anxiety.   Yes Historical Provider, MD  ibuprofen (ADVIL,MOTRIN) 200 MG tablet Take 600 mg by mouth every 6 (six) hours as needed for moderate pain.   Yes Historical Provider, MD  lamoTRIgine (LAMICTAL) 100 MG tablet Take 100 mg by mouth 2 (two) times daily.     Yes Historical Provider, MD  lisdexamfetamine (VYVANSE) 30 MG capsule Take 30 mg by mouth daily.   Yes Historical Provider, MD  metFORMIN (GLUCOPHAGE) 500 MG tablet Take 1 tablet (500 mg total) by mouth 2 (two) times daily with a meal. 02/21/16  Yes Donnetta HutchingBrian Cook, MD  venlafaxine XR (EFFEXOR-XR) 75 MG 24 hr capsule Take 75 mg by mouth daily.   Yes Historical Provider, MD  sulfamethoxazole-trimethoprim (BACTRIM DS,SEPTRA DS) 800-160 MG tablet Take 1 tablet by mouth 2 (two) times daily.  08/30/16 09/06/16  Eustace MooreLaura W Brailee Riede, MD  traMADol (ULTRAM) 50 MG tablet Take 1 tablet (50 mg total) by mouth 4 (four) times daily as needed. 08/30/16   Eustace MooreLaura W Loyce Klasen, MD    Family History No family history on file.  Social History Social History  Substance Use Topics  . Smoking status: Current Every Day Smoker    Packs/day: 1.00    Types: Cigarettes  . Smokeless tobacco: Never Used  . Alcohol use No     Allergies   Bee venom   Review of Systems Review of Systems  All other systems reviewed and are negative.    Physical Exam Triage Vital Signs ED Triage Vitals  Enc Vitals Group     BP 08/30/16 1115 146/97     Pulse Rate 08/30/16 1115 106     Resp 08/30/16 1115 14     Temp 08/30/16 1115 98.9 F (37.2 C)     Temp Source 08/30/16 1115 Oral     SpO2 08/30/16 1115 97 %     Weight 08/30/16 1131 (!) 340 lb (154.2 kg)     Height 08/30/16 1131 5\' 6"  (1.676 m)     Pain Score 08/30/16 1136 4   Updated Vital Signs BP 146/97 (BP Location: Left Arm)   Pulse 106   Temp 98.9 F (37.2 C) (Oral)   Resp 14  Ht 5\' 6"  (1.676 m)   Wt (!) 340 lb (154.2 kg)   SpO2 97%   BMI 54.88 kg/m  Physical Exam  Constitutional: She is oriented to person, place, and time. No distress.  Alert, nicely groomed  HENT:  Head: Atraumatic.  Eyes:  Conjugate gaze, no eye redness/drainage  Neck: Neck supple.  Cardiovascular: Normal rate.   Pulmonary/Chest: No respiratory distress.  Abdominal: She exhibits no distension.  Musculoskeletal: Normal range of motion.  No leg swelling  Neurological: She is alert and oriented to person, place, and time.  Skin: Skin is warm and dry.  No cyanosis Multiple tattoos of the arms Hyperpigmented lesion below the right breast, with scarring/sinus tract consistent with hidradenitis 1.5 inch erythematous fluctuant lesion under the left breast, tender, with an eccentric pore, and some associated blackheads. Focal areas of induration.  Nursing note and vitals  reviewed.    UC Treatments / Results   Procedures Procedures (including critical care time) Site washed with Hibiclens/saline, and skin infiltrated with 1% lidocaine without epinephrine. Stab incision made with return of clear/purulent drainage, and some blood. Wound base probed.  Antibiotic ointment and dry dressing applied.  Final Clinical Impressions(s) / UC Diagnoses   Final diagnoses:  Hidradenitis suppurativa  Abscess of left breast   Wash wound with soap/water 1-2x daily, apply antibiotic ointment/bandage.  Prescription for trimethoprim/sulfa sent to [facility] pharmacy; printed prescription for tramadol for pain.  Recheck for increasing redness/swelling/pain/drainage or new fever >100.5.  Anticipate gradual improvement in pain/swelling/drainage over the next several days; healing with scar over the next 2-3 weeks.  Followup with dermatologist.  New Prescriptions New Prescriptions   SULFAMETHOXAZOLE-TRIMETHOPRIM (BACTRIM DS,SEPTRA DS) 800-160 MG TABLET    Take 1 tablet by mouth 2 (two) times daily.   TRAMADOL (ULTRAM) 50 MG TABLET    Take 1 tablet (50 mg total) by mouth 4 (four) times daily as needed.     Eustace Moore, MD 09/02/16 [phone]

## 2016-08-30 NOTE — ED Triage Notes (Signed)
PT has an abscess under left breast. PT reports it has been present for a month. Area is red and inflamed. PT has gotten a little bit of white drainage out of area. PT has tried hot compresses.

## 2016-12-03 ENCOUNTER — Ambulatory Visit (INDEPENDENT_AMBULATORY_CARE_PROVIDER_SITE_OTHER): Payer: 59 | Admitting: Otolaryngology

## 2016-12-03 DIAGNOSIS — J353 Hypertrophy of tonsils with hypertrophy of adenoids: Secondary | ICD-10-CM

## 2016-12-03 DIAGNOSIS — J3503 Chronic tonsillitis and adenoiditis: Secondary | ICD-10-CM | POA: Diagnosis not present

## 2016-12-04 ENCOUNTER — Other Ambulatory Visit: Payer: Self-pay | Admitting: Otolaryngology

## 2017-02-12 ENCOUNTER — Emergency Department (HOSPITAL_COMMUNITY)
Admission: EM | Admit: 2017-02-12 | Discharge: 2017-02-12 | Disposition: A | Discharge status: Home / Self Care | Payer: 59 | Attending: Emergency Medicine | Admitting: Emergency Medicine

## 2017-02-12 ENCOUNTER — Encounter (HOSPITAL_COMMUNITY): Payer: Self-pay | Admitting: Emergency Medicine

## 2017-02-12 ENCOUNTER — Emergency Department (HOSPITAL_COMMUNITY): Payer: 59

## 2017-02-12 DIAGNOSIS — Y999 Unspecified external cause status: Secondary | ICD-10-CM | POA: Insufficient documentation

## 2017-02-12 DIAGNOSIS — Z23 Encounter for immunization: Secondary | ICD-10-CM | POA: Insufficient documentation

## 2017-02-12 DIAGNOSIS — Z79899 Other long term (current) drug therapy: Secondary | ICD-10-CM | POA: Diagnosis not present

## 2017-02-12 DIAGNOSIS — Z7984 Long term (current) use of oral hypoglycemic drugs: Secondary | ICD-10-CM | POA: Diagnosis not present

## 2017-02-12 DIAGNOSIS — Y9389 Activity, other specified: Secondary | ICD-10-CM | POA: Insufficient documentation

## 2017-02-12 DIAGNOSIS — S93401A Sprain of unspecified ligament of right ankle, initial encounter: Secondary | ICD-10-CM

## 2017-02-12 DIAGNOSIS — S99911A Unspecified injury of right ankle, initial encounter: Secondary | ICD-10-CM | POA: Diagnosis present

## 2017-02-12 DIAGNOSIS — F17210 Nicotine dependence, cigarettes, uncomplicated: Secondary | ICD-10-CM | POA: Insufficient documentation

## 2017-02-12 DIAGNOSIS — S8391XA Sprain of unspecified site of right knee, initial encounter: Secondary | ICD-10-CM | POA: Insufficient documentation

## 2017-02-12 DIAGNOSIS — W1849XA Other slipping, tripping and stumbling without falling, initial encounter: Secondary | ICD-10-CM | POA: Diagnosis not present

## 2017-02-12 DIAGNOSIS — Y929 Unspecified place or not applicable: Secondary | ICD-10-CM | POA: Diagnosis not present

## 2017-02-12 MED ORDER — ONDANSETRON HCL 4 MG PO TABS
4.0000 mg | ORAL_TABLET | Freq: Once | ORAL | Status: AC
  Administered 2017-02-12: 4 mg via ORAL
  Filled 2017-02-12: qty 1

## 2017-02-12 MED ORDER — HYDROCODONE-ACETAMINOPHEN 5-325 MG PO TABS
2.0000 | ORAL_TABLET | Freq: Once | ORAL | Status: AC
  Administered 2017-02-12: 2 via ORAL
  Filled 2017-02-12: qty 2

## 2017-02-12 MED ORDER — MELOXICAM 15 MG PO TABS
15.0000 mg | ORAL_TABLET | Freq: Every day | ORAL | 0 refills | Status: DC
Start: 2017-02-12 — End: 2017-05-29

## 2017-02-12 MED ORDER — HYDROCODONE-ACETAMINOPHEN 7.5-325 MG PO TABS: 1.0000 | ORAL_TABLET | ORAL | 0 refills | Status: DC | PRN

## 2017-02-12 MED ORDER — TETANUS-DIPHTH-ACELL PERTUSSIS 5-2.5-18.5 LF-MCG/0.5 IM SUSP
0.5000 mL | Freq: Once | INTRAMUSCULAR | Status: AC
  Administered 2017-02-12: 0.5 mL via INTRAMUSCULAR
  Filled 2017-02-12: qty 0.5

## 2017-02-12 NOTE — ED Provider Notes (Signed)
[facility] Provider Note   CSN: [id number] Arrival date & time: 02/12/17  1545     History   Chief Complaint Chief Complaint  Patient presents with  . Leg Injury    HPI Amy Reese is a 41 y.o. female.  Pt states she was redoing a floor in a trailer. Her foot slipped from a brace and the right leg well through the floor. Pt sustained abrasions of the lower leg, but she is concerned about  Right knee pain and swelling of the right ankle. She is unsure of the last tetanus shot. No other injury reported.   The history is provided by the patient.    Past Medical History:  Diagnosis Date  . Bipolar disorder (HCC)   . Endometriosis   . Obesity   . Polycystic ovarian disease     There are no active problems to display for this patient.   Past Surgical History:  Procedure Laterality Date  . ABDOMINAL HYSTERECTOMY      OB History    No data available       Home Medications    Prior to Admission medications   Medication Sig Start Date End Date Taking? Authorizing Provider  amLODipine (NORVASC) 5 MG tablet Take 1 tablet (5 mg total) by mouth daily. Patient taking differently: Take 10 mg by mouth daily.  02/21/16   Donnetta Hutching, MD  clonazePAM (KLONOPIN) 1 MG tablet Take 1 mg by mouth 2 (two) times daily as needed for anxiety.    Historical Provider, MD  HYDROcodone-acetaminophen (NORCO) 7.5-325 MG tablet Take 1 tablet by mouth every 4 (four) hours as needed. 02/12/17   Ivery Quale, PA-C  ibuprofen (ADVIL,MOTRIN) 200 MG tablet Take 600 mg by mouth every 6 (six) hours as needed for moderate pain.    Historical Provider, MD  lamoTRIgine (LAMICTAL) 100 MG tablet Take 100 mg by mouth 2 (two) times daily.      Historical Provider, MD  lisdexamfetamine (VYVANSE) 30 MG capsule Take 30 mg by mouth daily.    Historical Provider, MD  meloxicam (MOBIC) 15 MG tablet Take 1 tablet (15 mg total) by mouth daily. 02/12/17   Ivery Quale, PA-C  metFORMIN (GLUCOPHAGE) 500 MG  tablet Take 1 tablet (500 mg total) by mouth 2 (two) times daily with a meal. 02/21/16   Donnetta Hutching, MD  traMADol (ULTRAM) 50 MG tablet Take 1 tablet (50 mg total) by mouth 4 (four) times daily as needed. 08/30/16   Eustace Moore, MD  venlafaxine XR (EFFEXOR-XR) 75 MG 24 hr capsule Take 75 mg by mouth daily.    Historical Provider, MD    Family History No family history on file.  Social History Social History  Substance Use Topics  . Smoking status: Current Every Day Smoker    Packs/day: 1.00    Types: Cigarettes  . Smokeless tobacco: Never Used  . Alcohol use No     Allergies   Bee venom   Review of Systems Review of Systems  Musculoskeletal: Positive for arthralgias.  All other systems reviewed and are negative.    Physical Exam Updated Vital Signs BP (!) 139/95 (BP Location: Right Arm)   Pulse (!) 106   Temp 97.9 F (36.6 C) (Temporal)   Resp 20   Ht  (1.676 m)   Wt (!) 158.8 kg   SpO2 96%   BMI 56.49 kg/m   Physical Exam  Constitutional: She is oriented to person, place, and time. She appears well-developed and  well-nourished.  Non-toxic appearance.  HENT:  Head: Normocephalic.  Right Ear: Tympanic membrane and external ear normal.  Left Ear: Tympanic membrane and external ear normal.  Eyes: EOM and lids are normal. Pupils are equal, round, and reactive to light.  Neck: Normal range of motion. Neck supple. Carotid bruit is not present.  Cardiovascular: Normal rate, regular rhythm, normal heart sounds, intact distal pulses and normal pulses.   Pulmonary/Chest: Breath sounds normal. No respiratory distress.  Abdominal: Soft. Bowel sounds are normal. There is no tenderness. There is no guarding.  Musculoskeletal:       Right knee: She exhibits no effusion and no deformity. Tenderness found. Medial joint line tenderness noted.       Right ankle: She exhibits decreased range of motion. Tenderness. Lateral malleolus tenderness found.        Legs: Lymphadenopathy:       Head (right side): No submandibular adenopathy present.       Head (left side): No submandibular adenopathy present.    She has no cervical adenopathy.  Neurological: She is alert and oriented to person, place, and time. She has normal strength. No cranial nerve deficit or sensory deficit.  Skin: Skin is warm and dry.  Psychiatric: She has a normal mood and affect. Her speech is normal.  Nursing note and vitals reviewed.    ED Treatments / Results  Labs (all labs ordered are listed, but only abnormal results are displayed) Labs Reviewed - No data to display  EKG  EKG Interpretation None       Radiology Dg Ankle Complete Right  Result Date: 02/12/2017 CLINICAL DATA:  RIGHT leg went through the floor of a trailer she was helping to remodel, RIGHT knee pain, ankle swelling EXAM: RIGHT ANKLE - COMPLETE 3+ VIEW COMPARISON:  01/17/2013 FINDINGS: Soft tissue swelling RIGHT ankle and lower leg. Osseous mineralization normal. Joint spaces preserved. Plantar and Achilles insertion calcaneal spurs. No acute fracture, dislocation, or bone destruction. IMPRESSION: No acute osseous abnormalities. Electronically Signed   By: Ulyses Southward M.D.   On: 02/12/2017 17:00   Dg Knee Complete 4 Views Right  Result Date: 02/12/2017 CLINICAL DATA:  RIGHT leg went to the floor of a trailer she was helping remodel, RIGHT knee pain, ankle swelling EXAM: RIGHT KNEE - COMPLETE 4+ VIEW COMPARISON:  None FINDINGS: Question borderline low osseous mineralization. Spurring in the lateral compartment. Minimal joint space narrowing. No acute fracture, dislocation or bone destruction. No knee joint effusion. Anterior soft tissue swelling at the proximal lower leg. IMPRESSION: No acute osseous abnormalities. Mild degenerative changes RIGHT knee. Electronically Signed   By: Ulyses Southward M.D.   On: 02/12/2017 16:59    Procedures Procedures (including critical care time) Pt fitted with ASO by  nursing staff. After ASO application, pt noted to have good ROM of the toes. Cap refill 2+. No temp changes of the right lower extremity. Pt tolerated the procedure without problem. Medications Ordered in ED Medications  HYDROcodone-acetaminophen (NORCO/VICODIN) 5-325 MG per tablet 2 tablet (2 tablets Oral Given 02/12/17 1655)  ondansetron (ZOFRAN) tablet 4 mg (4 mg Oral Given 02/12/17 1655)  Tdap (BOOSTRIX) injection 0.5 mL (0.5 mLs Intramuscular Given 02/12/17 1655)     Initial Impression / Assessment and Plan / ED Course  I have reviewed the triage vital signs and the nursing notes.  Pertinent labs & imaging results that were available during my care of the patient were reviewed by me and considered in my medical decision making (see  chart for details).     **I have reviewed nursing notes, vital signs, and all appropriate lab and imaging results for this patient.*  Final Clinical Impressions(s) / ED Diagnoses MDM Pt tetanus status updated.  Xray of the right ankle is negative for fracture. Xray of the right knee reveals DJD changes, but no fracture or effusion. Pt fitted with ASO splint and crutches. Rx for Meloxicam and Norco given to the patient. Pt to follow up with orthopedics if any changes or problem.   Final diagnoses:  Sprain of right knee, unspecified ligament, initial encounter  Sprain of right ankle, unspecified ligament, initial encounter    New Prescriptions New Prescriptions   HYDROCODONE-ACETAMINOPHEN (NORCO) 7.5-325 MG TABLET    Take 1 tablet by mouth every 4 (four) hours as needed.   MELOXICAM (MOBIC) 15 MG TABLET    Take 1 tablet (15 mg total) by mouth daily.     Ivery Quale, PA-C 02/15/17 1252    Maia Plan, MD 02/15/17 [phone]

## 2017-02-12 NOTE — Discharge Instructions (Signed)
Your tetanus status was updated today. Please update your medical records to indicate that she received a tetanus today. The x-ray of your right knee and right ankle are negative for fracture or dislocation. Your examination is consistent with a strain/sprain of the knee and ankle area. Please use the ankle splint over the next 5-7 days. You do not need to sleep in this device, but it should be on when you're up and about. Please use your crutches until you're able to safely apply weight to the right lower extremity. Please elevate her right lower extremity when resting in the chair or in bed. Please see Dr. Romeo Apple for orthopedic evaluation and management if not improving.

## 2017-02-12 NOTE — ED Triage Notes (Signed)
An hour ago patient was helping DIY a trailer floor, there was a weak spot in the floor and her right leg fell through floor, complaining of right knee pain, ankle is swollen, pt is unsure when she had a tetanus

## 2017-02-20 ENCOUNTER — Encounter (HOSPITAL_COMMUNITY): Admission: RE | Payer: Self-pay | Source: Ambulatory Visit

## 2017-02-20 ENCOUNTER — Ambulatory Visit (HOSPITAL_COMMUNITY): Admission: RE | Admit: 2017-02-20 | Payer: 59 | Source: Ambulatory Visit | Admitting: Otolaryngology

## 2017-02-20 SURGERY — TONSILLECTOMY AND ADENOIDECTOMY
Anesthesia: General

## 2017-05-15 IMAGING — DX DG CHEST 2V
2 series · 2 of 2 positions shown · non-contrast
Comparison: 12/05/2013.

CLINICAL DATA: Chest heaviness started about 2 hours ago.

EXAM:
CHEST  2 VIEW

[chest pa]
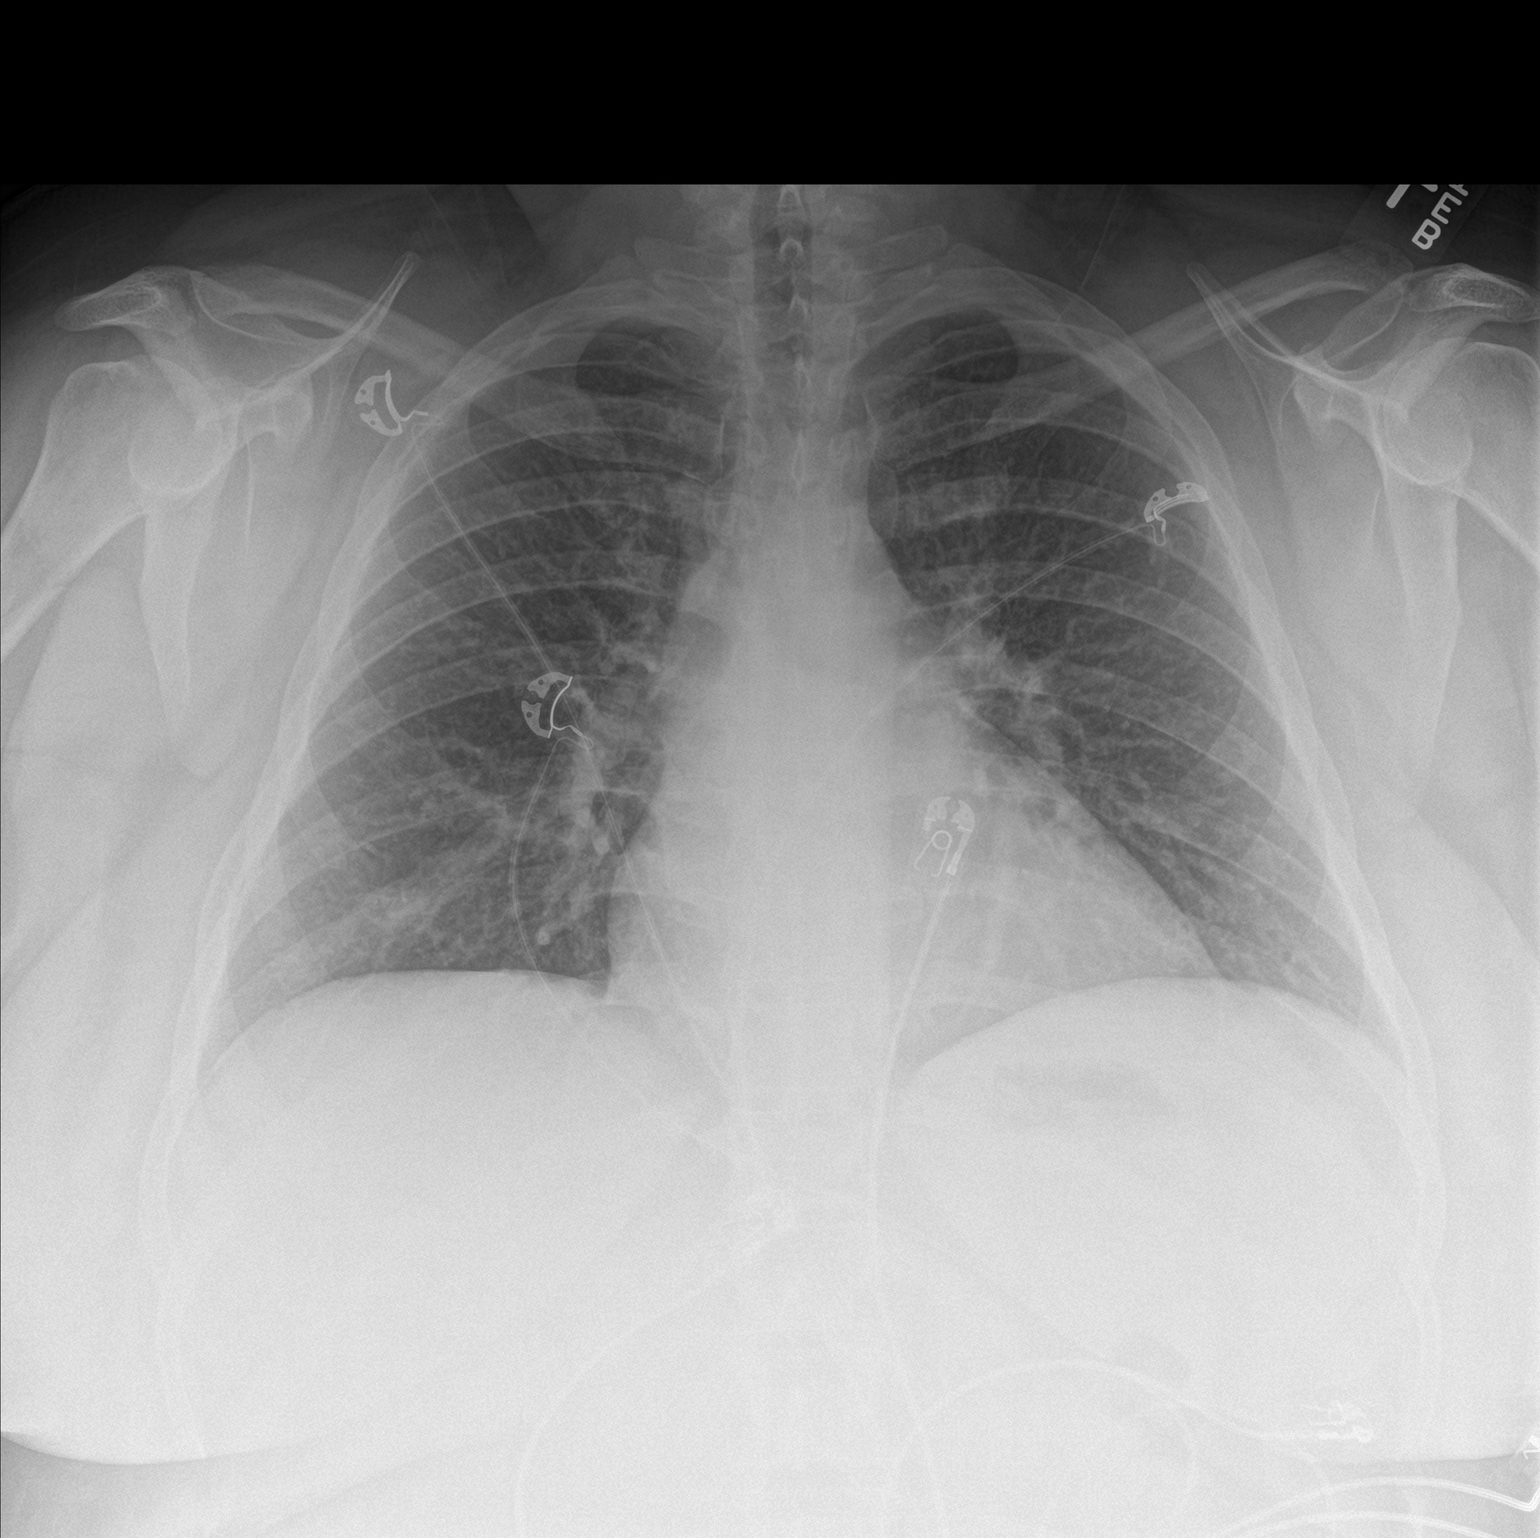

[chest lat]
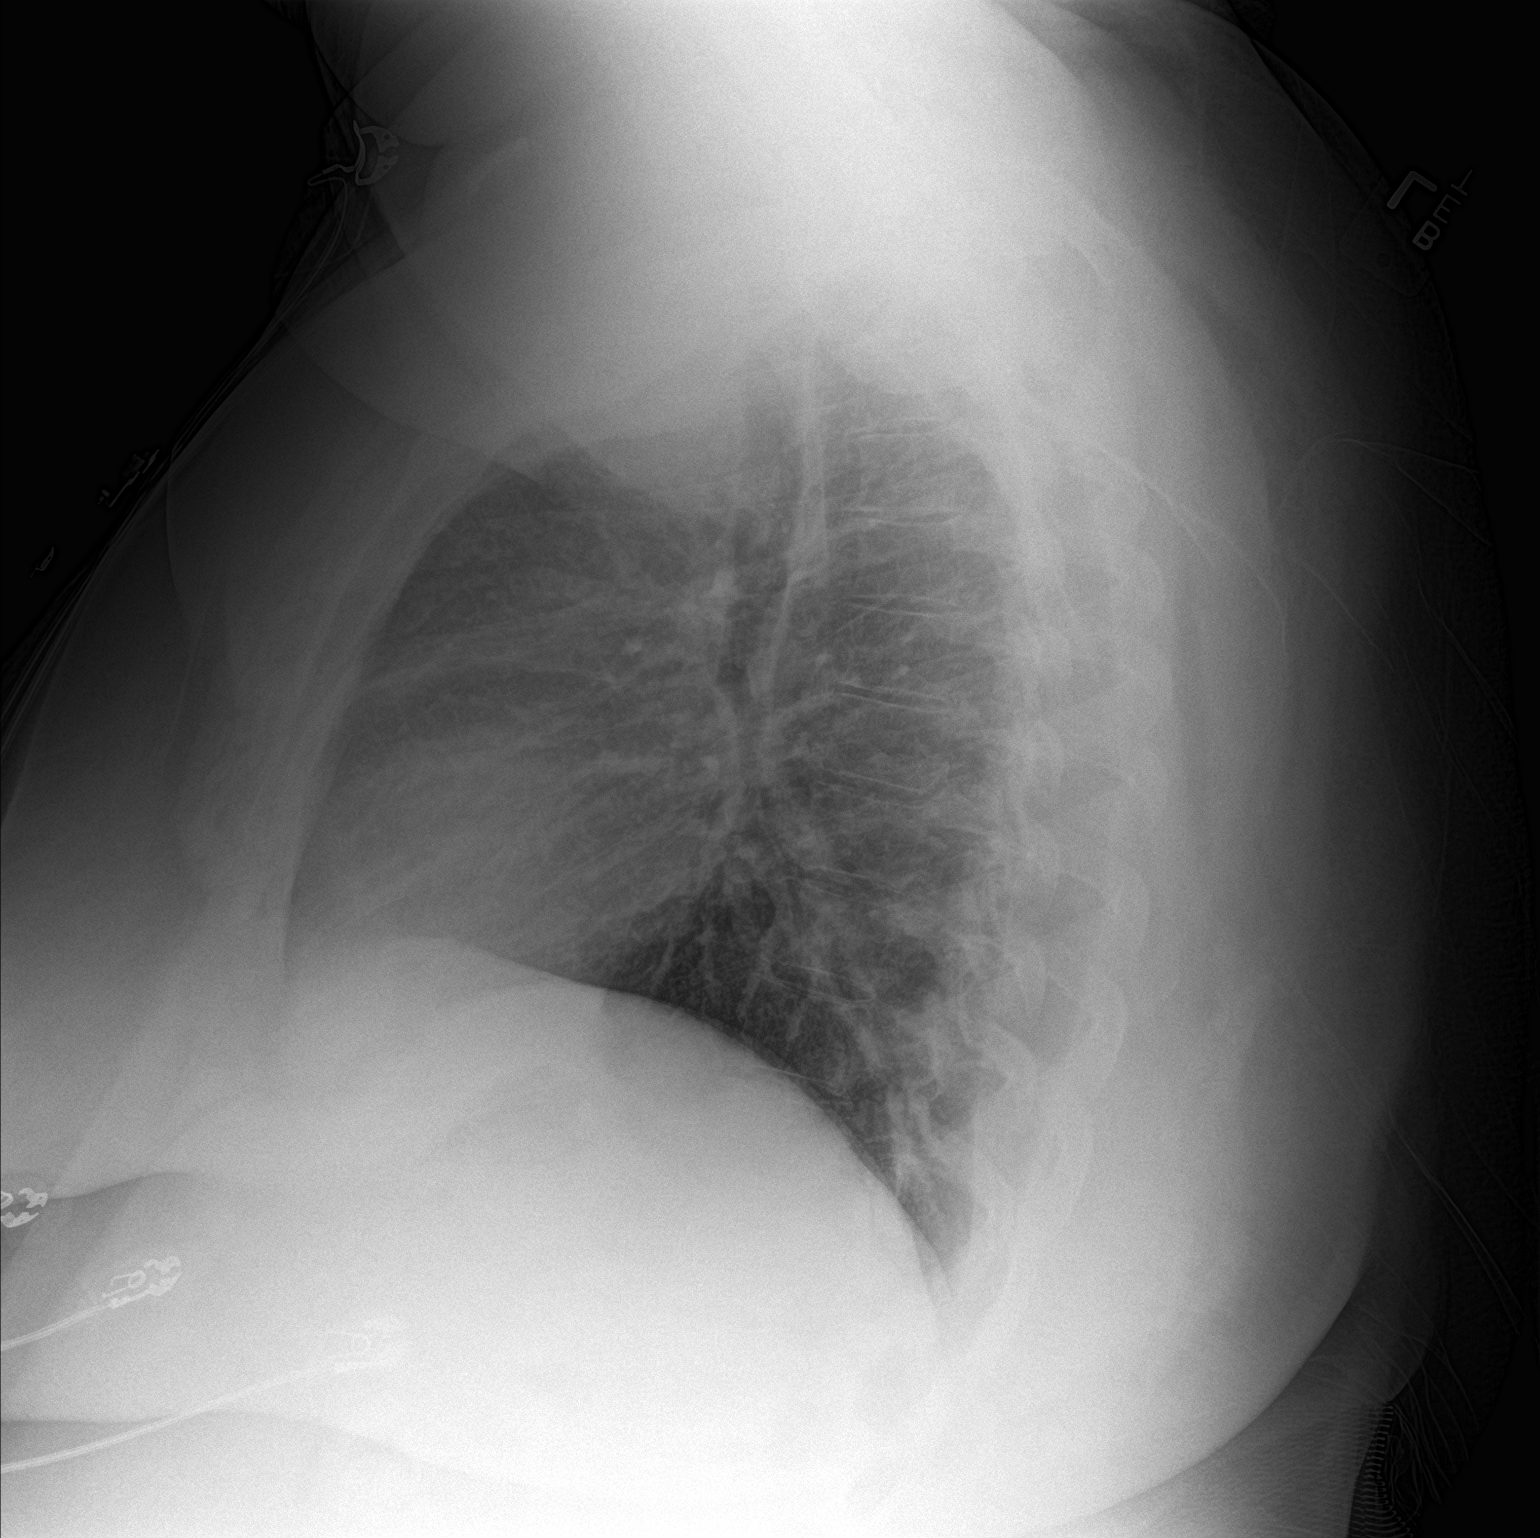

[2 of 2 positions shown; findings below may reference images not displayed]

FINDINGS: The lungs are clear wiithout focal pneumonia, edema, pneumothorax or
pleural effusion. The cardiopericardial silhouette is within normal
limits for size. The visualized bony structures of the thorax are
intact. Telemetry leads overlie the chest.
IMPRESSION: No active cardiopulmonary disease.

## 2017-05-23 ENCOUNTER — Other Ambulatory Visit: Payer: Self-pay | Admitting: Otolaryngology

## 2017-05-23 NOTE — Pre-Procedure Instructions (Signed)
Amy Reese  05/23/2017      [id number] [place], [place] [phone] [facility] [address] [place] [address] Phone: [phone] Fax: [phone]  [facility] - [place], [place] - [address] [place] [address] Phone: [phone] Fax: [phone]    Your procedure is scheduled on July 25  [facility]  Call this number if you have problems the morning of surgery:  [phone]   Remember:  Do not eat food or drink liquids after midnight.   Take these medicines the morning of surgery with A SIP OF WATER amLODipine (NORVASC), clonazePAM (KLONOPIN), HYDROcodone-acetaminophen (NORCO), lamoTRIgine (LAMICTAL), traMADol (ULTRAM),   venlafaxine XR (EFFEXOR-XR)  7 days prior to surgery STOP taking any Aspirin, Aleve, Naproxen, Ibuprofen, Motrin, Advil, Goody's, BC's, all herbal medications, fish oil, and all vitamins meloxicam (MOBIC)    WHAT DO I DO ABOUT MY DIABETES MEDICATION?   Marland Kitchen. Do not take oral diabetes medicines (pills) the morning of surgery. metFORMIN (GLUCOPHAGE   How to Manage Your Diabetes Before and After Surgery  Why is it important to control my blood sugar before and after surgery? . Improving blood sugar levels before and after surgery helps healing and can limit problems. . A way of improving blood sugar control is eating a healthy diet by: o  Eating less sugar and carbohydrates o  Increasing activity/exercise o  Talking with your doctor about reaching your blood sugar goals . High blood sugars (greater than 180 mg/dL) can raise your risk of infections and slow your recovery, so you will need to focus on controlling your diabetes during the weeks before surgery. . Make sure that the doctor who takes care of your diabetes knows about your planned surgery including the date and location.  How do I manage my blood sugar before  surgery? . Check your blood sugar at least 4 times a day, starting 2 days before surgery, to make sure that the level is not too high or low. o Check your blood sugar the morning of your surgery when you wake up and every 2 hours until you get to the Short Stay unit. . If your blood sugar is less than 70 mg/dL, you will need to treat for low blood sugar: o Do not take insulin. o Treat a low blood sugar (less than 70 mg/dL) with  cup of clear juice (cranberry or apple), 4 glucose tablets, OR glucose gel. o Recheck blood sugar in 15 minutes after treatment (to make sure it is greater than 70 mg/dL). If your blood sugar is not greater than 70 mg/dL on recheck, call [phone] for further instructions. . Report your blood sugar to the short stay nurse when you get to Short Stay.  . If you are admitted to the hospital after surgery: o Your blood sugar will be checked by the staff and you will probably be given insulin after surgery (instead of oral diabetes medicines) to make sure you have good blood sugar levels. o The goal for blood sugar control after surgery is 80-180 mg/dL.    Do not wear jewelry, make-up or nail polish.  Do not wear lotions, powders, or perfumes, or deoderant.  Do not shave 48 hours prior to surgery.    Do not bring valuables to the hospital.  [facility] for any belongings or valuables.  Contacts, dentures or bridgework may  not be worn into surgery.  Leave your suitcase in the car.  After surgery it may be brought to your room.  For patients admitted to the hospital, discharge time will be determined by your treatment team.  Patients discharged the day of surgery will not be allowed to drive home.    Special instructions:  [facility]- Preparing For Surgery  Before surgery, you can play an important role. Because skin is not sterile, your skin needs to be as free of germs as possible. You can reduce the number of germs on your skin by washing  with CHG (chlorahexidine gluconate) Soap before surgery.  CHG is an antiseptic cleaner which kills germs and bonds with the skin to continue killing germs even after washing.  Please do not use if you have an allergy to CHG or antibacterial soaps. If your skin becomes reddened/irritated stop using the CHG.  Do not shave (including legs and underarms) for at least 48 hours prior to first CHG shower. It is OK to shave your face.  Please follow these instructions carefully.   1. Shower the NIGHT BEFORE SURGERY and the MORNING OF SURGERY with CHG.   2. If you chose to wash your hair, wash your hair first as usual with your normal shampoo.  3. After you shampoo, rinse your hair and body thoroughly to remove the shampoo.  4. Use CHG as you would any other liquid soap. You can apply CHG directly to the skin and wash gently with a scrungie or a clean washcloth.   5. Apply the CHG Soap to your body ONLY FROM THE NECK DOWN.  Do not use on open wounds or open sores. Avoid contact with your eyes, ears, mouth and genitals (private parts). Wash genitals (private parts) with your normal soap.  6. Wash thoroughly, paying special attention to the area where your surgery will be performed.  7. Thoroughly rinse your body with warm water from the neck down.  8. DO NOT shower/wash with your normal soap after using and rinsing off the CHG Soap.  9. Pat yourself dry with a CLEAN TOWEL.   10. Wear CLEAN PAJAMAS   11. Place CLEAN SHEETS on your bed the night of your first shower and DO NOT SLEEP WITH PETS.    Day of Surgery: Do not apply any deodorants/lotions. Please wear clean clothes to the hospital/surgery center.      Please read over the following fact sheets that you were given.

## 2017-05-24 ENCOUNTER — Encounter (HOSPITAL_COMMUNITY): Payer: Self-pay

## 2017-05-24 ENCOUNTER — Encounter (HOSPITAL_COMMUNITY)
Admission: RE | Admit: 2017-05-24 | Discharge: 2017-05-24 | Disposition: A | Discharge status: Home / Self Care | Payer: 59 | Source: Ambulatory Visit | Attending: Otolaryngology | Admitting: Otolaryngology

## 2017-05-24 DIAGNOSIS — F17200 Nicotine dependence, unspecified, uncomplicated: Secondary | ICD-10-CM | POA: Insufficient documentation

## 2017-05-24 DIAGNOSIS — Z90710 Acquired absence of both cervix and uterus: Secondary | ICD-10-CM | POA: Diagnosis not present

## 2017-05-24 DIAGNOSIS — F329 Major depressive disorder, single episode, unspecified: Secondary | ICD-10-CM | POA: Diagnosis not present

## 2017-05-24 DIAGNOSIS — E282 Polycystic ovarian syndrome: Secondary | ICD-10-CM | POA: Insufficient documentation

## 2017-05-24 DIAGNOSIS — F419 Anxiety disorder, unspecified: Secondary | ICD-10-CM | POA: Insufficient documentation

## 2017-05-24 DIAGNOSIS — I10 Essential (primary) hypertension: Secondary | ICD-10-CM | POA: Insufficient documentation

## 2017-05-24 DIAGNOSIS — Z01812 Encounter for preprocedural laboratory examination: Secondary | ICD-10-CM | POA: Insufficient documentation

## 2017-05-24 DIAGNOSIS — Z6841 Body Mass Index (BMI) 40.0 and over, adult: Secondary | ICD-10-CM | POA: Insufficient documentation

## 2017-05-24 DIAGNOSIS — F319 Bipolar disorder, unspecified: Secondary | ICD-10-CM | POA: Diagnosis not present

## 2017-05-24 DIAGNOSIS — N809 Endometriosis, unspecified: Secondary | ICD-10-CM | POA: Insufficient documentation

## 2017-05-24 HISTORY — DX: Essential (primary) hypertension: I10

## 2017-05-24 HISTORY — DX: Anxiety disorder, unspecified: F41.9

## 2017-05-24 HISTORY — DX: Type 2 diabetes mellitus without complications: E11.9

## 2017-05-24 HISTORY — DX: Major depressive disorder, single episode, unspecified: F32.9

## 2017-05-24 HISTORY — DX: Depression, unspecified: F32.A

## 2017-05-24 LAB — CBC
HCT: 44.3 % (ref 36.0–46.0)
Hemoglobin: 14.3 g/dL (ref 12.0–15.0)
MCH: 30.7 pg (ref 26.0–34.0)
MCHC: 32.3 g/dL (ref 30.0–36.0)
MCV: 95.1 fL (ref 78.0–100.0)
PLATELETS: 213 10*3/uL (ref 150–400)
RBC: 4.66 MIL/uL (ref 3.87–5.11)
RDW: 14 % (ref 11.5–15.5)
WBC: 7.5 10*3/uL (ref 4.0–10.5)

## 2017-05-24 LAB — BASIC METABOLIC PANEL
Anion gap: 7 (ref 5–15)
BUN: 11 mg/dL (ref 6–20)
CALCIUM: 9.2 mg/dL (ref 8.9–10.3)
CO2: 29 mmol/L (ref 22–32)
CREATININE: 0.64 mg/dL (ref 0.44–1.00)
Chloride: 99 mmol/L — ABNORMAL LOW (ref 101–111)
GFR calc non Af Amer: >60 mL/min (ref >60)
Glucose, Bld: 172 mg/dL — ABNORMAL HIGH (ref 65–99)
Potassium: 4.5 mmol/L (ref 3.5–5.1)
Sodium: 135 mmol/L (ref 135–145)

## 2017-05-24 LAB — GLUCOSE, CAPILLARY: GLUCOSE-CAPILLARY: 185 mg/dL — AB (ref 65–99)

## 2017-05-24 NOTE — Progress Notes (Signed)
PCP:Dr. Lilly CoveNimish Reese   Cardiologist: pt denies   EKG: denies past year  Stress test: pt denies ever  ECHO: pt denies ever  Cardiac Cath: pt denies ever  Chest x-ray: denies past year

## 2017-05-25 LAB — HEMOGLOBIN A1C
HEMOGLOBIN A1C: 8.8 % — AB (ref 4.8–5.6)
MEAN PLASMA GLUCOSE: 206 mg/dL

## 2017-05-28 ENCOUNTER — Encounter (HOSPITAL_COMMUNITY): Payer: Self-pay

## 2017-05-28 NOTE — Progress Notes (Signed)
Anesthesia Chart Review: Patient is a 41 year old female scheduled for T&A on 05/29/17 by Dr. Suszanne Connerseoh.  History includes HTN, smoking, PCOS, endometriosis, anxiety, depression, Bipolar disorder, hysterectomy. She reported history of "borderline diabetes." BMI is consistent with super morbid obesity.   PCP was reported as Dr. Lilly CoveNimish Gosrani. She says she had been seeing him for a little over a year. Next appointment 06/06/17.   Meds include amlodipine, clonazepam, Lamictal, Effexor-XR. She was on metformin in the past, but has not been on for "over a year." (She believes it was prescribed by the doctor she was seeing before Dr. Karilyn CotaGosrani.)  BP (!) 156/99   Pulse 91   Temp 36.9 C (Oral)   Resp 16   Ht 5\' 6"  (1.676 m)   Wt (!) 358 lb 7 oz (162.6 kg)   SpO2 97%   BMI 57.85 kg/m  It doesn't appear that BP was rechecked at PAT.   EKG 05/24/17: NSR, possible LAE.   Preoperative labs noted. CBC WNL. Cr 0.64. Glucose 172. A1c 8.8, consistent with average glucose 206. Based on this her "borderline diabetes" appears to have progressed to DM2. She checked a fasting CBG this morning which was 194. She denied any recent steroids. Patient instructed that she should be checking her CBGs at least once a day and schedule follow-up with Dr. Karilyn CotaGosrani in the near future for DM treatment recommendations (scheduled for 06/06/17). I also notified staff and Dr. Patty SermonsGosrani's office and will forward labs and EKG for his records. Patient is aware that if her fasting CBG is > 200 or BP is significantly elevated on the day of surgery then her procedure could be delayed or cancelled.   Discussed with Dr. Suszanne Connerseoh patient's elevated BP and progression to DM (not currently on pharmacologic therapy). For T&A, he feels patient can proceed as planned with understanding a significantly elevated BP or CBG on the day of surgery could lead to case cancellation. I also discussed DM with anesthesiologist Dr. Chilton SiGreen. Patient will get vitals and CBG on  arrival. Anesthesiologist to re-evaluate on the day of surgery.  Amy Ochsllison Mollyann Halbert, PA-C [facility]/Anesthesiology Phone [phone] 05/28/2017 12:10 PM

## 2017-05-28 NOTE — Anesthesia Preprocedure Evaluation (Addendum)
Anesthesia Evaluation  Patient identified by MRN, date of birth, ID band Patient awake    Reviewed: Allergy & Precautions, NPO status , Patient's Chart, lab work & pertinent test results  Airway Mallampati: II       Dental  (+) Teeth Intact   Pulmonary Current Smoker,    Pulmonary exam normal breath sounds clear to auscultation       Cardiovascular hypertension, Pt. on medications Normal cardiovascular exam Rhythm:Regular Rate:Normal     Neuro/Psych PSYCHIATRIC DISORDERS Anxiety Depression Bipolar Disorder    GI/Hepatic   Endo/Other  diabetes, Oral Hypoglycemic AgentsMorbid obesity  Renal/GU   negative genitourinary   Musculoskeletal negative musculoskeletal ROS (+)   Abdominal (+) + obese,   Peds  Hematology   Anesthesia Other Findings   Reproductive/Obstetrics                            Anesthesia Physical Anesthesia Plan  ASA: III  Anesthesia Plan: General   Post-op Pain Management:    Induction:   PONV Risk Score and Plan: 3 and Ondansetron, Dexamethasone, Propofol and Midazolam  Airway Management Planned: Video Laryngoscope Planned and Oral ETT  Additional Equipment:   Intra-op Plan:   Post-operative Plan: Extubation in OR  Informed Consent:   Plan Discussed with: CRNA  Anesthesia Plan Comments:        Anesthesia Quick Evaluation

## 2017-05-29 ENCOUNTER — Ambulatory Visit (HOSPITAL_COMMUNITY)
Admission: RE | Admit: 2017-05-29 | Discharge: 2017-05-29 | Disposition: A | Discharge status: Home / Self Care | Payer: 59 | Source: Ambulatory Visit | Attending: Otolaryngology | Admitting: Otolaryngology

## 2017-05-29 ENCOUNTER — Encounter (HOSPITAL_COMMUNITY)
Admission: RE | Disposition: A | Discharge status: Home / Self Care | Payer: Self-pay | Source: Ambulatory Visit | Attending: Otolaryngology

## 2017-05-29 ENCOUNTER — Ambulatory Visit (HOSPITAL_COMMUNITY): Payer: 59 | Admitting: Vascular Surgery

## 2017-05-29 ENCOUNTER — Encounter (HOSPITAL_COMMUNITY): Payer: Self-pay | Admitting: Anesthesiology

## 2017-05-29 DIAGNOSIS — Z6841 Body Mass Index (BMI) 40.0 and over, adult: Secondary | ICD-10-CM | POA: Insufficient documentation

## 2017-05-29 DIAGNOSIS — J353 Hypertrophy of tonsils with hypertrophy of adenoids: Secondary | ICD-10-CM | POA: Diagnosis not present

## 2017-05-29 DIAGNOSIS — J3501 Chronic tonsillitis: Secondary | ICD-10-CM | POA: Diagnosis not present

## 2017-05-29 DIAGNOSIS — Z79899 Other long term (current) drug therapy: Secondary | ICD-10-CM | POA: Diagnosis not present

## 2017-05-29 DIAGNOSIS — I10 Essential (primary) hypertension: Secondary | ICD-10-CM | POA: Diagnosis not present

## 2017-05-29 DIAGNOSIS — F17210 Nicotine dependence, cigarettes, uncomplicated: Secondary | ICD-10-CM | POA: Insufficient documentation

## 2017-05-29 DIAGNOSIS — J3503 Chronic tonsillitis and adenoiditis: Secondary | ICD-10-CM | POA: Diagnosis not present

## 2017-05-29 DIAGNOSIS — Z9089 Acquired absence of other organs: Secondary | ICD-10-CM

## 2017-05-29 DIAGNOSIS — Z7984 Long term (current) use of oral hypoglycemic drugs: Secondary | ICD-10-CM | POA: Insufficient documentation

## 2017-05-29 DIAGNOSIS — E119 Type 2 diabetes mellitus without complications: Secondary | ICD-10-CM | POA: Diagnosis not present

## 2017-05-29 HISTORY — PX: TONSILLECTOMY AND ADENOIDECTOMY: SHX28

## 2017-05-29 LAB — GLUCOSE, CAPILLARY
GLUCOSE-CAPILLARY: 233 mg/dL — AB (ref 65–99)
Glucose-Capillary: 235 mg/dL — ABNORMAL HIGH (ref 65–99)

## 2017-05-29 SURGERY — TONSILLECTOMY AND ADENOIDECTOMY
Anesthesia: General | Site: Mouth

## 2017-05-29 MED ORDER — OXYCODONE HCL 5 MG/5ML PO SOLN
ORAL | Status: AC
  Filled 2017-05-29: qty 5

## 2017-05-29 MED ORDER — LIDOCAINE 2% (20 MG/ML) 5 ML SYRINGE
INTRAMUSCULAR | Status: AC
  Filled 2017-05-29: qty 5

## 2017-05-29 MED ORDER — MORPHINE SULFATE (PF) 2 MG/ML IV SOLN: 2.0000 mg | INTRAVENOUS | Status: DC | PRN

## 2017-05-29 MED ORDER — ONDANSETRON HCL 4 MG/2ML IJ SOLN: 4.0000 mg | Freq: Once | INTRAMUSCULAR | Status: DC | PRN

## 2017-05-29 MED ORDER — 0.9 % SODIUM CHLORIDE (POUR BTL) OPTIME
TOPICAL | Status: DC | PRN
  Administered 2017-05-29: 1000 mL

## 2017-05-29 MED ORDER — PROPOFOL 10 MG/ML IV BOLUS
INTRAVENOUS | Status: AC
  Filled 2017-05-29: qty 20

## 2017-05-29 MED ORDER — FENTANYL CITRATE (PF) 250 MCG/5ML IJ SOLN
INTRAMUSCULAR | Status: AC
  Filled 2017-05-29: qty 5

## 2017-05-29 MED ORDER — FENTANYL CITRATE (PF) 100 MCG/2ML IJ SOLN
INTRAMUSCULAR | Status: AC
  Filled 2017-05-29: qty 2

## 2017-05-29 MED ORDER — DEXAMETHASONE SODIUM PHOSPHATE 10 MG/ML IJ SOLN
INTRAMUSCULAR | Status: AC
  Filled 2017-05-29: qty 1

## 2017-05-29 MED ORDER — ONDANSETRON HCL 4 MG/2ML IJ SOLN
INTRAMUSCULAR | Status: DC | PRN
  Administered 2017-05-29: 4 mg via INTRAVENOUS

## 2017-05-29 MED ORDER — DEXMEDETOMIDINE HCL IN NACL 200 MCG/50ML IV SOLN
INTRAVENOUS | Status: AC
  Filled 2017-05-29: qty 50

## 2017-05-29 MED ORDER — SODIUM CHLORIDE 0.9 % IR SOLN
Status: DC | PRN
  Administered 2017-05-29: 1000 mL

## 2017-05-29 MED ORDER — SUCCINYLCHOLINE CHLORIDE 200 MG/10ML IV SOSY
PREFILLED_SYRINGE | INTRAVENOUS | Status: AC
  Filled 2017-05-29: qty 10

## 2017-05-29 MED ORDER — PHENYLEPHRINE 40 MCG/ML (10ML) SYRINGE FOR IV PUSH (FOR BLOOD PRESSURE SUPPORT)
PREFILLED_SYRINGE | INTRAVENOUS | Status: AC
  Filled 2017-05-29: qty 10

## 2017-05-29 MED ORDER — OXYCODONE HCL 5 MG/5ML PO SOLN
10.0000 mg | ORAL | Status: DC | PRN
  Administered 2017-05-29 (×2): 5 mg via ORAL
  Filled 2017-05-29: qty 10

## 2017-05-29 MED ORDER — ALBUTEROL SULFATE HFA 108 (90 BASE) MCG/ACT IN AERS
INHALATION_SPRAY | RESPIRATORY_TRACT | Status: DC | PRN
  Administered 2017-05-29 (×3): 2 via RESPIRATORY_TRACT

## 2017-05-29 MED ORDER — OXYMETAZOLINE HCL 0.05 % NA SOLN
NASAL | Status: AC
  Filled 2017-05-29: qty 15

## 2017-05-29 MED ORDER — MIDAZOLAM HCL 5 MG/5ML IJ SOLN
INTRAMUSCULAR | Status: DC | PRN
  Administered 2017-05-29: 2 mg via INTRAVENOUS

## 2017-05-29 MED ORDER — OXYCODONE HCL 5 MG/5ML PO SOLN: 5.0000 mg | ORAL | 0 refills | Status: DC | PRN

## 2017-05-29 MED ORDER — ONDANSETRON HCL 4 MG/2ML IJ SOLN
INTRAMUSCULAR | Status: AC
  Filled 2017-05-29: qty 2

## 2017-05-29 MED ORDER — SUCCINYLCHOLINE CHLORIDE 20 MG/ML IJ SOLN
INTRAMUSCULAR | Status: DC | PRN
  Administered 2017-05-29: 10 mg via INTRAVENOUS

## 2017-05-29 MED ORDER — OXYMETAZOLINE HCL 0.05 % NA SOLN
NASAL | Status: DC | PRN
  Administered 2017-05-29: 1

## 2017-05-29 MED ORDER — FENTANYL CITRATE (PF) 100 MCG/2ML IJ SOLN
INTRAMUSCULAR | Status: DC | PRN
  Administered 2017-05-29: 100 ug via INTRAVENOUS
  Administered 2017-05-29: 50 ug via INTRAVENOUS

## 2017-05-29 MED ORDER — PROPOFOL 10 MG/ML IV BOLUS
INTRAVENOUS | Status: DC | PRN
  Administered 2017-05-29: 100 mg via INTRAVENOUS
  Administered 2017-05-29: 200 mg via INTRAVENOUS
  Administered 2017-05-29 (×2): 100 mg via INTRAVENOUS

## 2017-05-29 MED ORDER — FENTANYL CITRATE (PF) 100 MCG/2ML IJ SOLN
25.0000 ug | INTRAMUSCULAR | Status: DC | PRN
Start: 2017-05-29 — End: 2017-05-29
  Administered 2017-05-29 (×3): 50 ug via INTRAVENOUS

## 2017-05-29 MED ORDER — ACETAMINOPHEN 10 MG/ML IV SOLN: 1000.0000 mg | Freq: Once | INTRAVENOUS | Status: DC | PRN

## 2017-05-29 MED ORDER — MEPERIDINE HCL 25 MG/ML IJ SOLN: 6.2500 mg | INTRAMUSCULAR | Status: DC | PRN

## 2017-05-29 MED ORDER — LIDOCAINE HCL (CARDIAC) 20 MG/ML IV SOLN
INTRAVENOUS | Status: DC | PRN
  Administered 2017-05-29: 100 mg via INTRAVENOUS

## 2017-05-29 MED ORDER — LACTATED RINGERS IV SOLN
INTRAVENOUS | Status: DC | PRN
  Administered 2017-05-29: 08:00:00 via INTRAVENOUS

## 2017-05-29 MED ORDER — AMOXICILLIN 400 MG/5ML PO SUSR: 800.0000 mg | Freq: Two times a day (BID) | ORAL | 0 refills | Status: AC

## 2017-05-29 MED ORDER — DEXAMETHASONE SODIUM PHOSPHATE 10 MG/ML IJ SOLN
INTRAMUSCULAR | Status: DC | PRN
  Administered 2017-05-29: 10 mg via INTRAVENOUS

## 2017-05-29 MED ORDER — MIDAZOLAM HCL 2 MG/2ML IJ SOLN
INTRAMUSCULAR | Status: AC
  Filled 2017-05-29: qty 2

## 2017-05-29 MED ORDER — DEXMEDETOMIDINE HCL 200 MCG/2ML IV SOLN
INTRAVENOUS | Status: DC | PRN
  Administered 2017-05-29 (×2): 80 ug via INTRAVENOUS

## 2017-05-29 SURGICAL SUPPLY — 22 items
CANISTER SUCT 3000ML PPV (MISCELLANEOUS) ×2 IMPLANT
CATH ROBINSON RED A/P 10FR (CATHETERS) IMPLANT
COAGULATOR SUCT SWTCH 10FR 6 (ELECTROSURGICAL) IMPLANT
CONT SPEC 4OZ CLIKSEAL STRL BL (MISCELLANEOUS) ×4 IMPLANT
ELECT REM PT RETURN 9FT ADLT (ELECTROSURGICAL)
ELECT REM PT RETURN 9FT PED (ELECTROSURGICAL)
ELECTRODE REM PT RETRN 9FT PED (ELECTROSURGICAL) IMPLANT
ELECTRODE REM PT RTRN 9FT ADLT (ELECTROSURGICAL) IMPLANT
GAUZE SPONGE 4X4 16PLY XRAY LF (GAUZE/BANDAGES/DRESSINGS) ×2 IMPLANT
GLOVE ECLIPSE 7.5 STRL STRAW (GLOVE) ×2 IMPLANT
GOWN STRL REUS W/ TWL LRG LVL3 (GOWN DISPOSABLE) ×2 IMPLANT
GOWN STRL REUS W/TWL LRG LVL3 (GOWN DISPOSABLE) ×4
KIT BASIN OR (CUSTOM PROCEDURE TRAY) ×2 IMPLANT
KIT ROOM TURNOVER OR (KITS) ×2 IMPLANT
NS IRRIG 1000ML POUR BTL (IV SOLUTION) ×2 IMPLANT
PACK SURGICAL SETUP 50X90 (CUSTOM PROCEDURE TRAY) ×2 IMPLANT
SPONGE TONSIL 1 RF SGL (DISPOSABLE) ×2 IMPLANT
SYR BULB 3OZ (MISCELLANEOUS) ×2 IMPLANT
TOWEL OR 17X24 6PK STRL BLUE (TOWEL DISPOSABLE) ×4 IMPLANT
TUBE CONNECTING 12X1/4 (SUCTIONS) ×2 IMPLANT
TUBE SALEM SUMP 16 FR W/ARV (TUBING) ×2 IMPLANT
WAND COBLATOR 70 EVAC XTRA (SURGICAL WAND) ×2 IMPLANT

## 2017-05-29 NOTE — H&P (Signed)
Cc: Recurrent tonsillitis  HPI: The patient is a 41 year old female who presents today complaining of frequent recurrent tonsillitis.  The patient is seen in consultation requested by Dr. Lilly CoveNimish Gosrani.  According to the patient, she has been experiencing recurrent tonsillitis for more than 30+ years.  However, the frequency and intensity of her infections have increased over the past year.  She has had 5 episodes of tonsillitis and pharyngitis over the past 1  months.  She was treated with multiple courses of antibiotics.  She complains of frequent dysphagia and odynophagia.  She has no previous history of ENT surgery.  She is interested in more definitive treatment to prevent her future recurrent infections.   The patient's review of systems (constitutional, eyes, ENT, cardiovascular, respiratory, GI, musculoskeletal, skin, neurologic, psychiatric, endocrine, hematologic, allergic) is noted in the ROS questionnaire.  It is reviewed with the patient.  Family health history: None noted. Major events: Hysterectomy.  Ongoing medical problems: Diabetes, hypertension.  Social history: The patient is single. She smokes one pack of cigarettes a day. She denies the use of alcohol.   Exam General: Communicates without difficulty, well nourished, no acute distress. Head: Normocephalic, no evidence injury, no tenderness, facial buttresses intact without stepoff. Face/sinus: No tenderness to palpation and percussion. Facial movement is normal and symmetric. Eyes: PERRL, EOMI. No scleral icterus, conjunctivae clear. Neuro: CN II exam reveals vision grossly intact.  No nystagmus at any point of gaze. Ears: Auricles well formed without lesions.  Ear canals are intact without mass or lesion.  No erythema or edema is appreciated.  The TMs are intact without fluid. Nose: External evaluation reveals normal support and skin without lesions.  Dorsum is intact.  Anterior rhinoscopy reveals healthy pink mucosa over  anterior aspect of inferior turbinates and intact septum.  No purulence noted. Oral:  Oral cavity and oropharynx are intact, symmetric.  Mucosa is moist without lesions. 3+ erythematous and cryptic tonsils bilaterally. Neck: Full range of motion without pain.  There is no significant lymphadenopathy.  No masses palpable.  Thyroid bed within normal limits to palpation.  Parotid glands and submandibular glands equal bilaterally without mass.  Trachea is midline. Neuro:  CN 2-12 grossly intact. Gait normal.   Assessment 1.  The patient's history and physical exam findings are consistent with chronic tonsillitis/pharyngitis, secondary to severe tonsillar hypertrophy.   2.  The patient is noted to have 3+ erythematous and cryptic tonsils bilaterally.   Plan  1.  The physical exam findings are reviewed with the patient.  2.  The option of adenotonsillectomy to treat her frequent recurrent tonsillitis is discussed.  The risks, benefits, alternatives and details of the procedure are extensively discussed.  3.  The patient would like to proceed with the procedure.

## 2017-05-29 NOTE — Op Note (Signed)
DATE OF PROCEDURE:  05/29/2017                              OPERATIVE REPORT  SURGEON:  Newman PiesSu Carine Nordgren, MD  PREOPERATIVE DIAGNOSES: 1. Adenotonsillar hypertrophy. 2. Chronic tonsillitis and pharyngitis  POSTOPERATIVE DIAGNOSES: 1. Adenotonsillar hypertrophy. 2. Chronic tonsillitis and pharyngitis  PROCEDURE PERFORMED:  Adenotonsillectomy.  ANESTHESIA:  General endotracheal tube anesthesia.  COMPLICATIONS:  None.  ESTIMATED BLOOD LOSS:  Minimal.  INDICATION FOR PROCEDURE:  Amy ChandlerJennifer Reese is a 41 y.o. female with a history of frequent recurrent tonsillitis/pharyngitis and halitosis.  According to the patient, she has been experiencing chronic throat discomfort with halitosis for several years. The patient continues to be symptomatic despite medical treatments with multiple antibiotics. On examination, the patient was noted to have bilateral cryptic tonsils, with numerous tonsilloliths. Based on the above findings, the decision was made for the patient to undergo the adenotonsillectomy procedure. Likelihood of success in reducing symptoms was also discussed.  The risks, benefits, alternatives, and details of the procedure were discussed with the patient.  Questions were invited and answered.  Informed consent was obtained.  DESCRIPTION:  The patient was taken to the operating room and placed supine on the operating table.  General endotracheal tube anesthesia was administered by the anesthesiologist.  The patient was positioned and prepped and draped in a standard fashion for adenotonsillectomy.  A Crowe-Davis mouth gag was inserted into the oral cavity for exposure. 3+ cryptic tonsils were noted bilaterally.  No bifidity was noted.  Indirect mirror examination of the nasopharynx revealed mild adenoid hypertrophy. The adenoid was ablated with the Coblator device. Hemostasis was achieved with the Coblator device.  The right tonsil was then grasped with a straight Allis clamp and retracted medially.  It  was resected free from the underlying pharyngeal constrictor muscles with the Coblator device.  The same procedure was repeated on the left side without exception.  The surgical sites were copiously irrigated.  The mouth gag was removed.  The care of the patient was turned over to the anesthesiologist.  The patient was awakened from anesthesia without difficulty.  The patient was extubated and transferred to the recovery room in good condition.  OPERATIVE FINDINGS:  Adenotonsillar hypertrophy.  SPECIMEN:  Bilateral tonsils  FOLLOWUP CARE:  The patient will be observed overnight.  She will be placed on amoxicillin 800 mg p.o. b.i.d. for 5 days, and oxycodone 5-710ml po q 4 hours for postop pain control.   The patient will follow up in my office in approximately 2 weeks.  Amy Reese Amy Reese 05/29/2017 8:21 AM

## 2017-05-29 NOTE — Discharge Instructions (Addendum)
SU WOOI TEOH M.D., P.A. °Postoperative Instructions for Tonsillectomy & Adenoidectomy (T&A) °Activity °Restrict activity at home for the first two days, resting as much as possible. Light indoor activity is best. You may usually return to school or work within a week but void strenuous activity and sports for two weeks. Sleep with your head elevated on 2-3 pillows for 3-4 days to help decrease swelling. °Diet °Due to tissue swelling and throat discomfort, you may have little desire to drink for several days. However fluids are very important to prevent dehydration. You will find that non-acidic juices, soups, popsicles, Jell-O, custard, puddings, and any soft or mashed foods taken in small quantities can be swallowed fairly easily. Try to increase your fluid and food intake as the discomfort subsides. It is recommended that a child receive 1-1/2 quarts of fluid in a 24-hour period. Adult require twice this amount.  °Discomfort °Your sore throat may be relieved by applying an ice collar to your neck and/or by taking Tylenol®. You may experience an earache, which is due to referred pain from the throat. Referred ear pain is commonly felt at night when trying to rest. ° °Bleeding                        Although rare, there is risk of having some bleeding during the first 2 weeks after having a T&A. This usually happens between days 7-10 postoperatively. If you or your child should have any bleeding, try to remain calm. We recommend sitting up quietly in a chair and gently spitting out the blood into a bowl. For adults, gargling gently with ice water may help. If the bleeding does not stop after a short time (5 minutes), is more than 1 teaspoonful, or if you become worried, please call our office at [phone] or go directly to the nearest hospital emergency room. Do not eat or drink anything prior to going to the hospital as you may need to be taken to the operating room in order to control the bleeding. °GENERAL  CONSIDERATIONS °1. Brush your teeth regularly. Avoid mouthwashes and gargles for three weeks. You may gargle gently with warm salt-water as necessary or spray with Chloraseptic®. You may make salt-water by placing 2 teaspoons of table salt into a quart of fresh water. Warm the salt-water in a microwave to a luke warm temperature.  °2. Avoid exposure to colds and upper respiratory infections if possible.  °3. If you look into a mirror or into your child's mouth, you will see white-gray patches in the back of the throat. This is normal after having a T&A and is like a scab that forms on the skin after an abrasion. It will disappear once the back of the throat heals completely. However, it may cause a noticeable odor; this too will disappear with time. Again, warm salt-water gargles may be used to help keep the throat clean and promote healing.  °4. You may notice a temporary change in voice quality, such as a higher pitched voice or a nasal sound, until healing is complete. This may last for 1-2 weeks and should resolve.  °5. Do not take or give you child any medications that we have not prescribed or recommended.  °6. Snoring may occur, especially at night, for the first week after a T&A. It is due to swelling of the soft palate and will usually resolve.  °Please call our office at [phone] if you have any questions.   °

## 2017-05-29 NOTE — Anesthesia Procedure Notes (Signed)
Procedure Name: Intubation Date/Time: 05/29/2017 7:54 AM Performed by: Rebekah Chesterfield L Pre-anesthesia Checklist: Patient identified, Emergency Drugs available, Suction available and Patient being monitored Patient Re-evaluated:Patient Re-evaluated prior to induction Oxygen Delivery Method: Circle System Utilized Preoxygenation: Pre-oxygenation with 100% oxygen Induction Type: IV induction and Cricoid Pressure applied Ventilation: Mask ventilation without difficulty Laryngoscope Size: Mac and 4 Grade View: Grade I Tube type: Oral Tube size: 7.5 mm Number of attempts: 1 Airway Equipment and Method: Stylet and Oral airway Placement Confirmation: ETT inserted through vocal cords under direct vision,  positive ETCO2 and breath sounds checked- equal and bilateral Secured at: 21 cm Tube secured with: Tape Dental Injury: Teeth and Oropharynx as per pre-operative assessment

## 2017-05-29 NOTE — Transfer of Care (Signed)
Immediate Anesthesia Transfer of Care Note  Patient: Amy Reese  Procedure(s) Performed: Procedure(s): TONSILLECTOMY AND ADENOIDECTOMY (N/A)  Patient Location: PACU  Anesthesia Type:General  Level of Consciousness: awake, alert , oriented and patient cooperative  Airway & Oxygen Therapy: Patient Spontanous Breathing and Patient connected to face mask oxygen  Post-op Assessment: Report given to RN, Post -op Vital signs reviewed and stable and Patient moving all extremities  Post vital signs: Reviewed and stable  Last Vitals:  Vitals:   05/29/17 0655 05/29/17 0838  BP: (!) 168/101   Pulse: 93   Resp: 18   Temp: 36.8 C 37 C    Last Pain:  Vitals:   05/29/17 0838  TempSrc:   PainSc: 10-Worst pain ever         Complications: No apparent anesthesia complications

## 2017-05-29 NOTE — Anesthesia Postprocedure Evaluation (Signed)
Anesthesia Post Note  Patient: Amy Reese  Procedure(s) Performed: Procedure(s) (LRB): TONSILLECTOMY AND ADENOIDECTOMY (N/A)     Patient location during evaluation: PACU Anesthesia Type: General Level of consciousness: awake Pain management: pain level not controlled Vital Signs Assessment: post-procedure vital signs reviewed and stable Respiratory status: spontaneous breathing Cardiovascular status: stable Postop Assessment: no signs of nausea or vomiting Anesthetic complications: no    Last Vitals:  Vitals:   05/29/17 1000 05/29/17 1003  BP:    Pulse: 74 69  Resp: 16 17  Temp:      Last Pain:  Vitals:   05/29/17 1003  TempSrc:   PainSc: 10-Worst pain ever   Pain Goal:                 Enrico Eaddy JR,JOHN Waymond Meador

## 2017-05-29 NOTE — Anesthesia Procedure Notes (Signed)
Performed by: Dane Bloch L       

## 2017-05-30 ENCOUNTER — Encounter (HOSPITAL_COMMUNITY): Payer: Self-pay | Admitting: Otolaryngology

## 2017-06-03 ENCOUNTER — Encounter (HOSPITAL_COMMUNITY): Payer: Self-pay | Admitting: Emergency Medicine

## 2017-06-03 ENCOUNTER — Emergency Department (HOSPITAL_COMMUNITY)
Admission: EM | Admit: 2017-06-03 | Discharge: 2017-06-03 | Disposition: A | Discharge status: Home / Self Care | Payer: 59 | Attending: Emergency Medicine | Admitting: Emergency Medicine

## 2017-06-03 ENCOUNTER — Emergency Department (HOSPITAL_COMMUNITY): Payer: 59

## 2017-06-03 DIAGNOSIS — I10 Essential (primary) hypertension: Secondary | ICD-10-CM | POA: Insufficient documentation

## 2017-06-03 DIAGNOSIS — E119 Type 2 diabetes mellitus without complications: Secondary | ICD-10-CM | POA: Diagnosis not present

## 2017-06-03 DIAGNOSIS — L7622 Postprocedural hemorrhage and hematoma of skin and subcutaneous tissue following other procedure: Secondary | ICD-10-CM | POA: Diagnosis not present

## 2017-06-03 DIAGNOSIS — Z7984 Long term (current) use of oral hypoglycemic drugs: Secondary | ICD-10-CM | POA: Insufficient documentation

## 2017-06-03 DIAGNOSIS — F17210 Nicotine dependence, cigarettes, uncomplicated: Secondary | ICD-10-CM | POA: Diagnosis not present

## 2017-06-03 DIAGNOSIS — Z79899 Other long term (current) drug therapy: Secondary | ICD-10-CM | POA: Insufficient documentation

## 2017-06-03 DIAGNOSIS — R041 Hemorrhage from throat: Secondary | ICD-10-CM | POA: Diagnosis present

## 2017-06-03 LAB — CBC WITH DIFFERENTIAL/PLATELET
Basophils Absolute: 0 10*3/uL (ref 0.0–0.1)
Basophils Relative: 0 %
EOS PCT: 1 %
Eosinophils Absolute: 0.1 10*3/uL (ref 0.0–0.7)
HCT: 44.7 % (ref 36.0–46.0)
Hemoglobin: 14.8 g/dL (ref 12.0–15.0)
Lymphocytes Relative: 17 %
Lymphs Abs: 1.7 10*3/uL (ref 0.7–4.0)
MCH: 30.8 pg (ref 26.0–34.0)
MCHC: 33.1 g/dL (ref 30.0–36.0)
MCV: 92.9 fL (ref 78.0–100.0)
Monocytes Absolute: 0.9 10*3/uL (ref 0.1–1.0)
Monocytes Relative: 9 %
Neutro Abs: 7.3 10*3/uL (ref 1.7–7.7)
Neutrophils Relative %: 73 %
PLATELETS: 250 10*3/uL (ref 150–400)
RBC: 4.81 MIL/uL (ref 3.87–5.11)
RDW: 13.9 % (ref 11.5–15.5)
WBC: 10.1 10*3/uL (ref 4.0–10.5)

## 2017-06-03 LAB — COMPREHENSIVE METABOLIC PANEL
ALT: 24 U/L (ref 14–54)
AST: 21 U/L (ref 15–41)
Albumin: 3.5 g/dL (ref 3.5–5.0)
Alkaline Phosphatase: 98 U/L (ref 38–126)
Anion gap: 9 (ref 5–15)
BILIRUBIN TOTAL: 0.8 mg/dL (ref 0.3–1.2)
BUN: 11 mg/dL (ref 6–20)
CO2: 26 mmol/L (ref 22–32)
CREATININE: 0.76 mg/dL (ref 0.44–1.00)
Calcium: 9.2 mg/dL (ref 8.9–10.3)
Chloride: 102 mmol/L (ref 101–111)
GFR calc Af Amer: >60 mL/min (ref >60)
Glucose, Bld: 221 mg/dL — ABNORMAL HIGH (ref 65–99)
Potassium: 4 mmol/L (ref 3.5–5.1)
Sodium: 137 mmol/L (ref 135–145)
Total Protein: 7.6 g/dL (ref 6.5–8.1)

## 2017-06-03 LAB — SAMPLE TO BLOOD BANK

## 2017-06-03 LAB — I-STAT CG4 LACTIC ACID, ED: Lactic Acid, Venous: 1.77 mmol/L (ref 0.5–1.9)

## 2017-06-03 LAB — I-STAT TROPONIN, ED: Troponin i, poc: 0.01 ng/mL (ref 0.00–0.08)

## 2017-06-03 MED ORDER — HYDROMORPHONE HCL 1 MG/ML IJ SOLN
1.0000 mg | Freq: Once | INTRAMUSCULAR | Status: AC
  Administered 2017-06-03: 1 mg via INTRAVENOUS
  Filled 2017-06-03: qty 1

## 2017-06-03 MED ORDER — SODIUM CHLORIDE 0.9 % IV BOLUS (SEPSIS)
1000.0000 mL | Freq: Once | INTRAVENOUS | Status: AC
  Administered 2017-06-03: 1000 mL via INTRAVENOUS

## 2017-06-03 MED ORDER — ONDANSETRON HCL 4 MG/2ML IJ SOLN
4.0000 mg | Freq: Once | INTRAMUSCULAR | Status: AC
  Administered 2017-06-03: 4 mg via INTRAVENOUS
  Filled 2017-06-03: qty 2

## 2017-06-03 NOTE — ED Triage Notes (Signed)
Pt arrives from home via [facility] c/o vomiting blood with clots s/p tonsillectomy on Wednesday, July 25.  Pt denies any problems before this, reports L sided CP, describes as "pressure", endorses lightheadedness and dizziness, denies SOB.   EMS reports giving 4 mg Zofran ODT.

## 2017-06-03 NOTE — ED Notes (Signed)
Patient transported to X-ray 

## 2017-06-03 NOTE — Medical Student Note (Signed)
[facility]-EMERGENCY DEPT Provider Student Note For educational purposes for Medical, PA and NP students only and not part of the legal medical record.   CSN: [id number] Arrival date & time: 06/03/17  1114     History   Chief Complaint Chief Complaint  Patient presents with  . Post-op Problem  . Chest Pain    HPI Amy Reese is a 41 y.o. female.  HPI  Patient is a 41 year old female with a PMHx of bipolar, PCOS, recently diagnosed DM that presents today with bleeding and emesis 5 days s/p tonsillectomy. She states that she woke this morning after coughing during her sleep. She noted blood running down her throat. She began gagging and then experienced 10-20 minutes of continuous vomiting bright red blood. She notes that she was vomiting blood clots mixed in. She states that at the same time she began to notice chest pain that is located in her left shoulder/axilla region. She notes that she called EMS who gave her Zofran 4mg . She notes that in the ED she still has some nausea. She has not vomited since arrival and her CP has resided somewhat to a 4/10. She reports that she was doing well after the procedure and was recovering well. She notes that her pain has been tolerable until this morning. She states her throat is the main source of pain now at 7/10. She denies any fevers, chills, SOB, difficulty breathing, swelling/numbness of throat, blood in stool.  Past Medical History:  Diagnosis Date  . Anxiety   . Bipolar disorder (HCC)   . Depression   . Diabetes mellitus without complication (HCC)    A1c 8.8 05/24/17  . Endometriosis   . Hypertension   . Obesity   . Polycystic ovarian disease     Patient Active Problem List   Diagnosis Date Noted  . S/P T&A (status post tonsillectomy and adenoidectomy) 05/29/2017    Past Surgical History:  Procedure Laterality Date  . ABDOMINAL HYSTERECTOMY    . TONSILLECTOMY AND ADENOIDECTOMY N/A 05/29/2017   Procedure: TONSILLECTOMY AND  ADENOIDECTOMY;  Surgeon: Newman Pieseoh, Su, MD;  Location: [facility] OR;  Service: ENT;  Laterality: N/A;    OB History    No data available       Home Medications    Prior to Admission medications   Medication Sig Start Date End Date Taking? Authorizing Provider  amLODipine (NORVASC) 5 MG tablet Take 1 tablet (5 mg total) by mouth daily. Patient taking differently: Take 10 mg by mouth daily.  02/21/16   Donnetta Hutchingook, Brian, MD  clonazePAM (KLONOPIN) 1 MG tablet Take 1 mg by mouth 2 (two) times daily as needed for anxiety.    [provider]  ibuprofen (ADVIL,MOTRIN) 200 MG tablet Take 600 mg by mouth every 6 (six) hours as needed for moderate pain.    [provider]  lamoTRIgine (LAMICTAL) 100 MG tablet Take 100 mg by mouth 2 (two) times daily.      [provider]  metFORMIN (GLUCOPHAGE) 500 MG tablet Take 1 tablet (500 mg total) by mouth 2 (two) times daily with a meal. 02/21/16   Donnetta Hutchingook, Brian, MD  oxyCODONE (ROXICODONE) 5 MG/5ML solution Take 5-10 mLs (5-10 mg total) by mouth every 4 (four) hours as needed for severe pain. 05/29/17   Newman Pieseoh, Su, MD  venlafaxine XR (EFFEXOR-XR) 75 MG 24 hr capsule Take 75 mg by mouth daily.    [provider]    Family History No family history on file.  Social History Social History  Substance Use Topics  . Smoking status: Current Every Day Smoker    Packs/day: 1.00    Types: Cigarettes  . Smokeless tobacco: Never Used  . Alcohol use No     Allergies   Bee venom   Review of Systems Review of Systems  10 point review of system is negative except those noted in the ED.  Physical Exam Updated Vital Signs BP 118/73   Pulse (!) 104   Temp 98.7 F (37.1 C) (Oral)   Resp 12   Ht 5\' 6"  (1.676 m)   Wt (!) 157.4 kg   SpO2 96%   BMI 56.01 kg/m   Physical Exam  Constitutional: She is oriented to person, place, and time. She appears well-developed and well-nourished. No distress.  HENT:  Head: Normocephalic and atraumatic.    Nose: Rhinorrhea present. No mucosal edema. No epistaxis.  No foreign bodies.  Mouth/Throat: Uvula is midline. No tonsillar abscesses. Tonsils are 0 on the right. Tonsils are 0 on the left.    Cardiovascular: Normal rate, regular rhythm and normal heart sounds.   No murmur heard. Pulmonary/Chest: Breath sounds normal. No respiratory distress. She has no wheezes.  Abdominal: Soft. There is no tenderness.  Neurological: She is alert and oriented to person, place, and time.  Skin: Skin is warm and dry.     ED Treatments / Results  Labs (all labs ordered are listed, but only abnormal results are displayed) Labs Reviewed  COMPREHENSIVE METABOLIC PANEL - Abnormal; Notable for the following:       Result Value   Glucose, Bld 221 (*)    All other components within normal limits  CBC WITH DIFFERENTIAL/PLATELET  I-STAT CG4 LACTIC ACID, ED  I-STAT TROPONIN, ED  SAMPLE TO BLOOD BANK    EKG  EKG Interpretation None       Radiology Dg Chest 2 View  Result Date: 06/03/2017 CLINICAL DATA:  41 year old female with vomiting and chest pain today. Tonsillectomy last week. EXAM: CHEST  2 VIEW COMPARISON:  02/21/2016 and earlier. FINDINGS: Lung volumes are stable and within normal limits. Normal cardiac size and mediastinal contours. Visualized tracheal air column is within normal limits. No pneumothorax or pneumoperitoneum. Lung parenchyma appears stable and clear. Negative visible bowel gas pattern. No acute osseous abnormality identified. IMPRESSION: Negative.  No acute cardiopulmonary abnormality. Electronically Signed   By: Odessa FlemingH  Hall M.D.   On: 06/03/2017 12:16    Procedures Procedures (including critical care time)  Medications Ordered in ED Medications  sodium chloride 0.9 % bolus 1,000 mL (1,000 mLs Intravenous New Bag/Given 06/03/17 1243)  HYDROmorphone (DILAUDID) injection 1 mg (1 mg Intravenous Given 06/03/17 1244)  ondansetron (ZOFRAN) injection 4 mg (4 mg Intravenous Given 06/03/17  1244)     Initial Impression / Assessment and Plan / ED Course  I have reviewed the triage vital signs and the nursing notes.  Pertinent labs & imaging results that were available during my care of the patient were reviewed by me and considered in my medical decision making (see chart for details).    Patient is a 41 year old female 5 days s/p tonsillectomy. She presents with bleeding and emesis from her posterior pharynx/epistaxis. She states that she was awoken with the bleeding and it was brisk bleeding for about 20 minutes. She notes that the bleeding has subsided but she still has increased pain. She denies any further emesis. She also noted some chest pain located in her left shoulder. She  states that pain has resided since she has been seen in the ED. CBC, CMP, Lactic acid, and troponin returned normal. EKG and CXR unremarkable. She was administered 1L of NS, Dilaudid 2mg  and Zofran in the ED. A consultation to her ENT surgeon (Dr. Suszanne Conners) was placed who advised to administer a second liter of IVF, have the patient gargle in the ED, and PO challenge. Following gargling she was able to clear a few more blood clots. No active bleeding was noted. Advised to discharge patient home with close follow up with ENT. Strict return instructions were given. Patient appears stable for discharge and is agreeable with plan.  Final Clinical Impressions(s) / ED Diagnoses   Final diagnoses:  None    New Prescriptions New Prescriptions   No medications on file

## 2017-06-03 NOTE — ED Provider Notes (Signed)
[facility] Provider Note   CSN: [id number] Arrival date & time: 06/03/17  1114     History   Chief Complaint Chief Complaint  Patient presents with  . Post-op Problem  . Chest Pain    HPI Amy Reese is a 41 y.o. female.  Patient presents with complaint of throat pain, bleeding from her throat wound, hematemesis. Patient had a tonsillectomy and adenoidectomy performed on 05/29/17 by Dr. Suszanne Connerseoh. Patient has done okay postop. She states that she thinks she coughed prior to waking up today causing her to have bleeding down her throat and through her nose. Patient subsequently had an episode of vomiting. She called EMS. She reports brisk bleeding for approximately 20 minutes before slowing. Currently she feels like there is a large clot in the back of her throat. No lightheadedness or syncope. After the vomiting, patient developed chest pain that is worse in her left chest and shoulder. Symptoms are made worse with movement. It is described as a soreness. No radiation. The onset of this condition was acute. The course is constant. Aggravating factors: none. Alleviating factors: none. No anticoagulation.        Past Medical History:  Diagnosis Date  . Anxiety   . Bipolar disorder (HCC)   . Depression   . Diabetes mellitus without complication (HCC)    A1c 8.8 05/24/17  . Endometriosis   . Hypertension   . Obesity   . Polycystic ovarian disease     Patient Active Problem List   Diagnosis Date Noted  . S/P T&A (status post tonsillectomy and adenoidectomy) 05/29/2017    Past Surgical History:  Procedure Laterality Date  . ABDOMINAL HYSTERECTOMY    . TONSILLECTOMY AND ADENOIDECTOMY N/A 05/29/2017   Procedure: TONSILLECTOMY AND ADENOIDECTOMY;  Surgeon: Newman Pieseoh, Su, MD;  Location: [facility] OR;  Service: ENT;  Laterality: N/A;    OB History    No data available       Home Medications    Prior to Admission medications   Medication Sig Start Date End Date Taking?  Authorizing Provider  amLODipine (NORVASC) 5 MG tablet Take 1 tablet (5 mg total) by mouth daily. Patient taking differently: Take 10 mg by mouth daily.  02/21/16   Donnetta Hutchingook, Brian, MD  clonazePAM (KLONOPIN) 1 MG tablet Take 1 mg by mouth 2 (two) times daily as needed for anxiety.    [provider]  ibuprofen (ADVIL,MOTRIN) 200 MG tablet Take 600 mg by mouth every 6 (six) hours as needed for moderate pain.    [provider]  lamoTRIgine (LAMICTAL) 100 MG tablet Take 100 mg by mouth 2 (two) times daily.      [provider]  metFORMIN (GLUCOPHAGE) 500 MG tablet Take 1 tablet (500 mg total) by mouth 2 (two) times daily with a meal. 02/21/16   Donnetta Hutchingook, Brian, MD  oxyCODONE (ROXICODONE) 5 MG/5ML solution Take 5-10 mLs (5-10 mg total) by mouth every 4 (four) hours as needed for severe pain. 05/29/17   Newman Pieseoh, Su, MD  venlafaxine XR (EFFEXOR-XR) 75 MG 24 hr capsule Take 75 mg by mouth daily.    [provider]    Family History No family history on file.  Social History Social History  Substance Use Topics  . Smoking status: Current Every Day Smoker    Packs/day: 1.00    Types: Cigarettes  . Smokeless tobacco: Never Used  . Alcohol use No     Allergies   Bee venom   Review of Systems Review of  Systems  Constitutional: Negative for fever.  HENT: Positive for sore throat. Negative for rhinorrhea.   Eyes: Negative for redness.  Respiratory: Negative for cough and shortness of breath.   Cardiovascular: Positive for chest pain.  Gastrointestinal: Positive for vomiting. Negative for abdominal pain, diarrhea and nausea.  Genitourinary: Negative for dysuria.  Musculoskeletal: Negative for myalgias.  Skin: Negative for rash.  Neurological: Negative for syncope, light-headedness and headaches.     Physical Exam Updated Vital Signs BP 129/74   Pulse (!) 115   Temp 98.7 F (37.1 C) (Oral)   Resp (!) 25   Ht 5\' 6"  (1.676 m)   Wt (!) 157.4 kg (347 lb)   SpO2  98%   BMI 56.01 kg/m   Physical Exam  Constitutional: She appears well-developed and well-nourished.  HENT:  Head: Normocephalic and atraumatic.  Right Ear: Tympanic membrane normal.  Left Ear: Tympanic membrane normal.  Nose: No mucosal edema.  Mouth/Throat: Posterior oropharyngeal edema present.  There is bright red blood noted in the posterior pharynx without evidence of active bleeding. Associated edema and clotting. Scar tissue noted bilaterally.  Eyes: Conjunctivae are normal. Right eye exhibits no discharge. Left eye exhibits no discharge.  Neck: Normal range of motion. Neck supple.  Cardiovascular: Regular rhythm and normal heart sounds.  Tachycardia present.   Pulmonary/Chest: Effort normal and breath sounds normal.  Abdominal: Soft. There is no tenderness.  Neurological: She is alert.  Skin: Skin is warm and dry.  Psychiatric: She has a normal mood and affect.  Nursing note and vitals reviewed.    ED Treatments / Results  Labs (all labs ordered are listed, but only abnormal results are displayed) Labs Reviewed  COMPREHENSIVE METABOLIC PANEL - Abnormal; Notable for the following:       Result Value   Glucose, Bld 221 (*)    All other components within normal limits  CBC WITH DIFFERENTIAL/PLATELET  I-STAT CG4 LACTIC ACID, ED  I-STAT TROPONIN, ED  SAMPLE TO BLOOD BANK    Radiology Dg Chest 2 View  Result Date: 06/03/2017 CLINICAL DATA:  41 year old female with vomiting and chest pain today. Tonsillectomy last week. EXAM: CHEST  2 VIEW COMPARISON:  02/21/2016 and earlier. FINDINGS: Lung volumes are stable and within normal limits. Normal cardiac size and mediastinal contours. Visualized tracheal air column is within normal limits. No pneumothorax or pneumoperitoneum. Lung parenchyma appears stable and clear. Negative visible bowel gas pattern. No acute osseous abnormality identified. IMPRESSION: Negative.  No acute cardiopulmonary abnormality. Electronically Signed    By: Odessa FlemingH  Hall M.D.   On: 06/03/2017 12:16    Procedures Procedures (including critical care time)  Medications Ordered in ED Medications  sodium chloride 0.9 % bolus 1,000 mL (0 mLs Intravenous Stopped 06/03/17 1507)  HYDROmorphone (DILAUDID) injection 1 mg (1 mg Intravenous Given 06/03/17 1244)  ondansetron (ZOFRAN) injection 4 mg (4 mg Intravenous Given 06/03/17 1244)  sodium chloride 0.9 % bolus 1,000 mL (0 mLs Intravenous Stopped 06/03/17 1544)  HYDROmorphone (DILAUDID) injection 1 mg (1 mg Intravenous Given 06/03/17 1404)     Initial Impression / Assessment and Plan / ED Course  I have reviewed the triage vital signs and the nursing notes.  Pertinent labs & imaging results that were available during my care of the patient were reviewed by me and considered in my medical decision making (see chart for details).     Patient seen and examined. No active bleeding. Will monitor. Awaiting work-up.    Vital signs reviewed and are  as follows: BP 129/74   Pulse (!) 115   Temp 98.7 F (37.1 C) (Oral)   Resp (!) 25   Ht 5\' 6"  (1.676 m)   Wt (!) 157.4 kg (347 lb)   SpO2 98%   BMI 56.01 kg/m   Spoke with Dr. Suszanne Conners. Reccs gargling to clear clots, pain control, IV hydration.   Patient did well and is tolerating ice water. On reexam, clots in the posterior pharynx are clear. Patient with normal-appearing scar tissue without any active bleeding. Patient states that she is feeling much better as well and is comfortable with discharge to home. She will continue home pain medications, hydration.  Encouraged patient to call ENT if mild bleeding recurs, return to the emergency department with brisk bleeding. She verbalizes understanding and agrees with plan.   Final Clinical Impressions(s) / ED Diagnoses   Final diagnoses:  Bleeding from throat   Patient with postop bleeding, 6 days postop from tonsillectomy and adenoidectomy. Patient is not anemic or symptomatic from blood loss here. She was  monitored for several hours in the emergency department with clearing of existing clots and no further active bleeding. ENT aware of patient and recommendations as above. She is tolerating oral fluids without difficulty.  New Prescriptions Discharge Medication List as of 06/03/2017  3:41 PM       Renne Crigler, PA-C 06/03/17 1619    Charlynne Pander, MD 06/05/17 [id number]

## 2017-06-03 NOTE — ED Notes (Signed)
Pt able to clear 2-3 clots from throat. No visible bleeding in throat at this time.

## 2017-06-03 NOTE — Discharge Instructions (Signed)
Please read and follow all provided instructions.  Your diagnoses today include:  1. Bleeding from throat    Tests performed today include:  Blood counts and electrolytes  Vital signs. See below for your results today.   Medications prescribed:   None  Take any prescribed medications only as directed.  Home care instructions:  Follow any educational materials contained in this packet.  BE VERY CAREFUL not to take multiple medicines containing Tylenol (also called acetaminophen). Doing so can lead to an overdose which can damage your liver and cause liver failure and possibly death.   Follow-up instructions: Please follow-up with your primary care provider in the next 3 days for further evaluation of your symptoms.   Return instructions:   Please return to the Emergency Department if you experience worsening symptoms.   Please return if you have any other emergent concerns.  Additional Information:  Your vital signs today were: BP 130/86    Pulse 97    Temp 98.7 F (37.1 C) (Oral)    Resp 16    Ht 5\' 6"  (1.676 m)    Wt (!) 157.4 kg (347 lb)    SpO2 94%    BMI 56.01 kg/m  If your blood pressure (BP) was elevated above 135/85 this visit, please have this repeated by your doctor within one month. --------------

## 2017-06-05 ENCOUNTER — Ambulatory Visit (HOSPITAL_COMMUNITY): Payer: 59 | Admitting: Anesthesiology

## 2017-06-05 ENCOUNTER — Emergency Department (HOSPITAL_COMMUNITY)
Admission: EM | Admit: 2017-06-05 | Discharge: 2017-06-05 | Disposition: A | Discharge status: Home / Self Care | Payer: 59 | Attending: Emergency Medicine | Admitting: Emergency Medicine

## 2017-06-05 ENCOUNTER — Ambulatory Visit (HOSPITAL_COMMUNITY)
Admission: AD | Admit: 2017-06-05 | Discharge: 2017-06-05 | Disposition: A | Discharge status: Home / Self Care | Payer: 59 | Source: Home / Self Care | Attending: Otolaryngology | Admitting: Otolaryngology

## 2017-06-05 ENCOUNTER — Encounter (HOSPITAL_COMMUNITY): Payer: Self-pay | Admitting: Emergency Medicine

## 2017-06-05 ENCOUNTER — Encounter (HOSPITAL_COMMUNITY)
Admission: AD | Disposition: A | Discharge status: Home / Self Care | Payer: Self-pay | Source: Home / Self Care | Attending: Otolaryngology

## 2017-06-05 DIAGNOSIS — E119 Type 2 diabetes mellitus without complications: Secondary | ICD-10-CM

## 2017-06-05 DIAGNOSIS — Z79899 Other long term (current) drug therapy: Secondary | ICD-10-CM | POA: Insufficient documentation

## 2017-06-05 DIAGNOSIS — F418 Other specified anxiety disorders: Secondary | ICD-10-CM | POA: Diagnosis not present

## 2017-06-05 DIAGNOSIS — F319 Bipolar disorder, unspecified: Secondary | ICD-10-CM | POA: Insufficient documentation

## 2017-06-05 DIAGNOSIS — J3501 Chronic tonsillitis: Secondary | ICD-10-CM | POA: Insufficient documentation

## 2017-06-05 DIAGNOSIS — K91840 Postprocedural hemorrhage and hematoma of a digestive system organ or structure following a digestive system procedure: Secondary | ICD-10-CM

## 2017-06-05 DIAGNOSIS — J358 Other chronic diseases of tonsils and adenoids: Secondary | ICD-10-CM | POA: Diagnosis present

## 2017-06-05 DIAGNOSIS — F17200 Nicotine dependence, unspecified, uncomplicated: Secondary | ICD-10-CM | POA: Insufficient documentation

## 2017-06-05 DIAGNOSIS — Z7984 Long term (current) use of oral hypoglycemic drugs: Secondary | ICD-10-CM | POA: Insufficient documentation

## 2017-06-05 DIAGNOSIS — Z6841 Body Mass Index (BMI) 40.0 and over, adult: Secondary | ICD-10-CM | POA: Insufficient documentation

## 2017-06-05 DIAGNOSIS — J95830 Postprocedural hemorrhage and hematoma of a respiratory system organ or structure following a respiratory system procedure: Secondary | ICD-10-CM | POA: Insufficient documentation

## 2017-06-05 DIAGNOSIS — R041 Hemorrhage from throat: Secondary | ICD-10-CM | POA: Diagnosis present

## 2017-06-05 DIAGNOSIS — I10 Essential (primary) hypertension: Secondary | ICD-10-CM

## 2017-06-05 HISTORY — PX: TONSILLECTOMY: SHX5217

## 2017-06-05 LAB — GLUCOSE, CAPILLARY: GLUCOSE-CAPILLARY: 180 mg/dL — AB (ref 65–99)

## 2017-06-05 SURGERY — TONSILLECTOMY
Anesthesia: General | Site: Mouth

## 2017-06-05 MED ORDER — FENTANYL CITRATE (PF) 100 MCG/2ML IJ SOLN
INTRAMUSCULAR | Status: AC
  Filled 2017-06-05: qty 2

## 2017-06-05 MED ORDER — HYDROMORPHONE HCL 1 MG/ML IJ SOLN
0.2500 mg | INTRAMUSCULAR | Status: DC | PRN
  Administered 2017-06-05 (×2): 0.5 mg via INTRAVENOUS

## 2017-06-05 MED ORDER — FENTANYL CITRATE (PF) 250 MCG/5ML IJ SOLN
INTRAMUSCULAR | Status: AC
  Filled 2017-06-05: qty 5

## 2017-06-05 MED ORDER — OXYMETAZOLINE HCL 0.05 % NA SOLN
NASAL | Status: AC
  Filled 2017-06-05: qty 15

## 2017-06-05 MED ORDER — FENTANYL CITRATE (PF) 250 MCG/5ML IJ SOLN
INTRAMUSCULAR | Status: DC | PRN
  Administered 2017-06-05 (×2): 50 ug via INTRAVENOUS

## 2017-06-05 MED ORDER — 0.9 % SODIUM CHLORIDE (POUR BTL) OPTIME
TOPICAL | Status: DC | PRN
  Administered 2017-06-05: 1000 mL

## 2017-06-05 MED ORDER — FENTANYL CITRATE (PF) 100 MCG/2ML IJ SOLN
25.0000 ug | INTRAMUSCULAR | Status: DC | PRN
  Administered 2017-06-05 (×3): 50 ug via INTRAVENOUS

## 2017-06-05 MED ORDER — OXYCODONE HCL 5 MG PO TABS
5.0000 mg | ORAL_TABLET | Freq: Once | ORAL | Status: AC | PRN
  Administered 2017-06-05: 5 mg via ORAL

## 2017-06-05 MED ORDER — SUCCINYLCHOLINE CHLORIDE 20 MG/ML IJ SOLN
INTRAMUSCULAR | Status: DC | PRN
  Administered 2017-06-05: 100 mg via INTRAVENOUS

## 2017-06-05 MED ORDER — MORPHINE SULFATE (PF) 4 MG/ML IV SOLN
2.0000 mg | INTRAVENOUS | Status: DC | PRN
  Administered 2017-06-05 (×3): 2 mg via INTRAVENOUS
  Filled 2017-06-05 (×3): qty 1

## 2017-06-05 MED ORDER — OXYCODONE HCL 5 MG PO TABS
ORAL_TABLET | ORAL | Status: AC
  Filled 2017-06-05: qty 1

## 2017-06-05 MED ORDER — MIDAZOLAM HCL 2 MG/2ML IJ SOLN
INTRAMUSCULAR | Status: DC | PRN
  Administered 2017-06-05: 2 mg via INTRAVENOUS

## 2017-06-05 MED ORDER — LACTATED RINGERS IV SOLN
INTRAVENOUS | Status: DC | PRN
  Administered 2017-06-05: 03:00:00 via INTRAVENOUS

## 2017-06-05 MED ORDER — LIDOCAINE HCL (CARDIAC) 20 MG/ML IV SOLN
INTRAVENOUS | Status: DC | PRN
  Administered 2017-06-05: 80 mg via INTRATRACHEAL

## 2017-06-05 MED ORDER — OXYMETAZOLINE HCL 0.05 % NA SOLN
NASAL | Status: DC | PRN
Start: 2017-06-05 — End: 2017-06-05
  Administered 2017-06-05: 1 via TOPICAL

## 2017-06-05 MED ORDER — ONDANSETRON HCL 4 MG/2ML IJ SOLN
INTRAMUSCULAR | Status: DC | PRN
  Administered 2017-06-05: 4 mg via INTRAVENOUS

## 2017-06-05 MED ORDER — ONDANSETRON 4 MG PO TBDP
4.0000 mg | ORAL_TABLET | Freq: Once | ORAL | Status: AC
  Administered 2017-06-05: 4 mg via ORAL
  Filled 2017-06-05: qty 1

## 2017-06-05 MED ORDER — OXYCODONE-ACETAMINOPHEN 5-325 MG PO TABS: 1.0000 | ORAL_TABLET | ORAL | 0 refills | Status: DC | PRN

## 2017-06-05 MED ORDER — OXYCODONE HCL 5 MG/5ML PO SOLN: 5.0000 mg | Freq: Once | ORAL | Status: AC | PRN

## 2017-06-05 MED ORDER — MIDAZOLAM HCL 2 MG/2ML IJ SOLN
INTRAMUSCULAR | Status: AC
  Filled 2017-06-05: qty 2

## 2017-06-05 MED ORDER — HYDROMORPHONE HCL 1 MG/ML IJ SOLN
INTRAMUSCULAR | Status: AC
  Filled 2017-06-05: qty 1

## 2017-06-05 MED ORDER — PROPOFOL 10 MG/ML IV BOLUS
INTRAVENOUS | Status: DC | PRN
  Administered 2017-06-05: 160 mg via INTRAVENOUS
  Administered 2017-06-05: 40 mg via INTRAVENOUS

## 2017-06-05 SURGICAL SUPPLY — 23 items
CANISTER SUCT 3000ML PPV (MISCELLANEOUS) ×2 IMPLANT
CATH ROBINSON RED A/P 10FR (CATHETERS) IMPLANT
COAGULATOR SUCT SWTCH 10FR 6 (ELECTROSURGICAL) ×2 IMPLANT
ELECT COATED BLADE 2.86 ST (ELECTRODE) ×2 IMPLANT
ELECT REM PT RETURN 9FT ADLT (ELECTROSURGICAL)
ELECT REM PT RETURN 9FT PED (ELECTROSURGICAL)
ELECTRODE REM PT RETRN 9FT PED (ELECTROSURGICAL) IMPLANT
ELECTRODE REM PT RTRN 9FT ADLT (ELECTROSURGICAL) IMPLANT
GAUZE SPONGE 4X4 16PLY XRAY LF (GAUZE/BANDAGES/DRESSINGS) ×2 IMPLANT
GLOVE ECLIPSE 7.5 STRL STRAW (GLOVE) ×2 IMPLANT
GOWN STRL REUS W/ TWL LRG LVL3 (GOWN DISPOSABLE) ×2 IMPLANT
GOWN STRL REUS W/TWL LRG LVL3 (GOWN DISPOSABLE) ×4
KIT BASIN OR (CUSTOM PROCEDURE TRAY) ×2 IMPLANT
KIT ROOM TURNOVER OR (KITS) ×2 IMPLANT
NS IRRIG 1000ML POUR BTL (IV SOLUTION) ×2 IMPLANT
PACK SURGICAL SETUP 50X90 (CUSTOM PROCEDURE TRAY) ×2 IMPLANT
PAD ARMBOARD 7.5X6 YLW CONV (MISCELLANEOUS) ×4 IMPLANT
SPONGE TONSIL 1 RF SGL (DISPOSABLE) ×2 IMPLANT
SYR BULB 3OZ (MISCELLANEOUS) ×2 IMPLANT
TOWEL OR 17X24 6PK STRL BLUE (TOWEL DISPOSABLE) ×4 IMPLANT
TUBE CONNECTING 12X1/4 (SUCTIONS) ×2 IMPLANT
TUBE SALEM SUMP 12R W/ARV (TUBING) ×2 IMPLANT
WAND COBLATOR 70 EVAC XTRA (SURGICAL WAND) ×2 IMPLANT

## 2017-06-05 NOTE — Discharge Summary (Signed)
Physician Discharge Summary  Patient ID: Westley ChandlerJennifer Farone MRN: [id number] DOB/AGE: 1975-11-16 41 y.o.  Admit date: 06/05/2017 Discharge date: 06/05/2017  Admission Diagnoses: Post-tonsileectomy oropharyngeal hemorrhage  Discharge Diagnoses: Post-tonsileectomy oropharyngeal hemorrhage Active Problems:   Hemorrhage of right tonsil   Discharged Condition: good  Hospital Course: Pt had an uneventful overnight stay. No bleeding. No stridor.  Consults: None  Significant Diagnostic Studies: None  Treatments: surgery: Control of oropharyngeal hemorrhage  Discharge Exam: Blood pressure (!) 126/58, pulse 92, temperature 98.4 F (36.9 C), temperature source Oral, resp. rate 16, height 5\' 6"  (1.676 m), weight (!) 347 lb 0.1 oz (157.4 kg), SpO2 92 %. No bleeding. No stridor.  Disposition: 01-Home or Self Care  Discharge Instructions    Activity as tolerated - No restrictions    Complete by:  As directed    Activity as tolerated - No restrictions    Complete by:  As directed    Diet general    Complete by:  As directed    Diet general    Complete by:  As directed      Allergies as of 06/05/2017      Reactions   Bee Venom Anaphylaxis, Swelling      Medication List    STOP taking these medications   ibuprofen 100 MG/5ML suspension Commonly known as:  ADVIL,MOTRIN   ibuprofen 200 MG tablet Commonly known as:  ADVIL,MOTRIN   oxyCODONE 5 MG/5ML solution Commonly known as:  ROXICODONE     TAKE these medications   amLODipine 5 MG tablet Commonly known as:  NORVASC Take 1 tablet (5 mg total) by mouth daily. What changed:  how much to take   clonazePAM 1 MG tablet Commonly known as:  KLONOPIN Take 1 mg by mouth 2 (two) times daily as needed for anxiety.   lamoTRIgine 100 MG tablet Commonly known as:  LAMICTAL Take 100 mg by mouth 2 (two) times daily.   oxyCODONE-acetaminophen 5-325 MG tablet Commonly known as:  ROXICET Take 1-2 tablets by mouth every 4 (four) hours as  needed for severe pain.   venlafaxine XR 75 MG 24 hr capsule Commonly known as:  EFFEXOR-XR Take 75 mg by mouth daily.      Follow-up Information    Newman Pieseoh, Cormac Wint, MD Follow up on 06/13/2017.   Specialty:  Otolaryngology Why:  As scheduled Contact information: [address] [place] [address] [phone]           Signed: Karn PicklerSu W River Ambrosio 06/05/2017, 11:17 AM

## 2017-06-05 NOTE — Anesthesia Preprocedure Evaluation (Signed)
Anesthesia Evaluation  Patient identified by MRN, date of birth, ID band Patient awake    Reviewed: Allergy & Precautions, NPO status , Patient's Chart, lab work & pertinent test results  History of Anesthesia Complications Negative for: history of anesthetic complications  Airway Mallampati: II  TM Distance: >3 FB Neck ROM: Full    Dental  (+) Teeth Intact   Pulmonary Current Smoker,    Pulmonary exam normal breath sounds clear to auscultation       Cardiovascular hypertension, Pt. on medications Normal cardiovascular exam Rhythm:Regular Rate:Normal     Neuro/Psych PSYCHIATRIC DISORDERS Anxiety Depression Bipolar Disorder    GI/Hepatic   Endo/Other  diabetes, Oral Hypoglycemic AgentsMorbid obesity  Renal/GU   negative genitourinary   Musculoskeletal negative musculoskeletal ROS (+)   Abdominal (+) + obese,   Peds  Hematology   Anesthesia Other Findings   Reproductive/Obstetrics                             Anesthesia Physical Anesthesia Plan  ASA: III and emergent  Anesthesia Plan: General   Post-op Pain Management:    Induction: Intravenous, Rapid sequence and Cricoid pressure planned  PONV Risk Score and Plan: 2 and Ondansetron, Dexamethasone and Treatment may vary due to age or medical condition  Airway Management Planned: Oral ETT  Additional Equipment: None  Intra-op Plan:   Post-operative Plan: Extubation in OR  Informed Consent: I have reviewed the patients History and Physical, chart, labs and discussed the procedure including the risks, benefits and alternatives for the proposed anesthesia with the patient or authorized representative who has indicated his/her understanding and acceptance.   Dental advisory given  Plan Discussed with: Surgeon and CRNA  Anesthesia Plan Comments:         Anesthesia Quick Evaluation

## 2017-06-05 NOTE — Progress Notes (Signed)
Called Dr. Suszanne Connerseoh pt very anxious and requiring a lot of narcotics.  This RN doesn't feel comfortable sending pt home with the amount that has been given.  Dr. Suszanne Connerseoh gave verbal orders for admit to outpatient with extended recover, pain medications, diet, VS etc.  Will continue to monitor and notify for changes.

## 2017-06-05 NOTE — H&P (Signed)
Cc: Post tonsillectomy hemorrhage  HPI:  The patient is a 41 year old female who underwent adenotonsillectomy one week ago to treat her chronic tonsillitis and pharyngitis. Over the last 2 nights, the patient has been experiencing recurrent bleeding from her oral pharynx. She was treated at the emergency room last night and discharged home. According to the patient, she had another episode of severe bleeding tonight.   Exam General: Communicates without difficulty, well nourished, no acute distress. Head: Normocephalic, no evidence injury, no tenderness, facial buttresses intact without stepoff. Face/sinus: No tenderness to palpation and percussion. Facial movement is normal and symmetric. Eyes: PERRL, EOMI. No scleral icterus, conjunctivae clear. Neuro: CN II exam reveals vision grossly intact.  No nystagmus at any point of gaze. Ears: Auricles well formed without lesions.  Ear canals are intact without mass or lesion.  No erythema or edema is appreciated.  The TMs are intact without fluid. Nose: External evaluation reveals normal support and skin without lesions.  Dorsum is intact.  Anterior rhinoscopy reveals healthy pink mucosa over anterior aspect of inferior turbinates and intact septum.  No purulence noted. Oral:  Oral cavity and oropharynx are intact, symmetric. Blood clots are noted within the oropharynx. Neck: Full range of motion without pain.  There is no significant lymphadenopathy.  No masses palpable.  Thyroid bed within normal limits to palpation.  Parotid glands and submandibular glands equal bilaterally without mass.  Trachea is midline.   Assessment: Post tonsillectomy hemorrhage.  Plan: The decision is made for patient to undergo operative control of her post tonsillectomy hemorrhage. The risks, benefits, and details of the procedure are reviewed with the patient and her mother. Questions are invited and answered. Informed consent is obtained.

## 2017-06-05 NOTE — ED Notes (Signed)
[facility]  Pt left with her mother to go to meet him

## 2017-06-05 NOTE — Progress Notes (Signed)
Subjective: Pt resting comfortably in bed. No bleeding.  Objective: Vital signs in last 24 hours: Temp:  [97.2 F (36.2 C)-99.2 F (37.3 C)] 98.4 F (36.9 C) (08/01 0856) Pulse Rate:  [92-103] 92 (08/01 0856) Resp:  [5-22] 16 (08/01 0856) BP: (93-126)/(50-78) 126/58 (08/01 0856) SpO2:  [92 %-100 %] 92 % (08/01 0856) Weight:  [347 lb 0.1 oz (157.4 kg)] 347 lb 0.1 oz (157.4 kg) (08/01 0900)  Awake and alert. No bleeding. No stridor.   Recent Labs  06/03/17 1147  WBC 10.1  HGB 14.8  HCT 44.7  PLT 250    Recent Labs  06/03/17 1147  NA 137  K 4.0  CL 102  CO2 26  GLUCOSE 221*  BUN 11  CREATININE 0.76  CALCIUM 9.2    Medications:  I have reviewed the patient's current medications. Scheduled: . fentaNYL      . fentaNYL      . HYDROmorphone      . oxyCODONE       Continuous:  ZOX:WRUEAVWUPRN:morphine injection  Assessment/Plan: Pt s/p control of post-tonsillectomy oropharyngeal hemorrhage. Doing well. D/C home.   LOS: 0 days   Luise Yamamoto W Nayana Lenig 06/05/2017, 11:14 AM

## 2017-06-05 NOTE — Op Note (Signed)
DATE OF PROCEDURE:  06/05/2017                              OPERATIVE REPORT  SURGEON:  Newman PiesSu Rindi Beechy, MD  PREOPERATIVE DIAGNOSES: 1. Post-tonsillectomy oropharyngeal hemorrhage  POSTOPERATIVE DIAGNOSES: 1. Post-tonsillectomy oropharyngeal hemorrhage  PROCEDURE PERFORMED:  Control of oropharyngeal hemorrhage  ANESTHESIA:  General endotracheal tube anesthesia.  COMPLICATIONS:  None.  ESTIMATED BLOOD LOSS:  50 mL  INDICATION FOR PROCEDURE:  Amy Reese is a 41 y.o. female who underwent adenotonsillectomy surgery one week ago to treat her chronic tonsillitis/pharyngitis. The patient has been experiencing significant oropharyngeal hemorrhage from her tonsillectomy site for the past 2 nights. On examination, she was noted to have blood clots in her oropharynx. The decision was therefore made for patient to undergo control of her oropharyngeal hemorrhage in the operating room. The risks, benefits, alternatives, and details of the procedure were discussed with the patient.  Questions were invited and answered.  Informed consent was obtained.  DESCRIPTION:  The patient was taken to the operating room and placed supine on the operating table.  General endotracheal tube anesthesia was administered by the anesthesiologist.  The patient was positioned and prepped and draped in a standard fashion for adenotonsillectomy.  A Crowe-Davis mouth gag was inserted into the oral cavity for exposure. A blood clot was noted within the right tonsillar fossa. The blood clot was evacuated. An active arterial bleeder was noted at the right inferior tonsillar fossa. The bleeding source was cauterized using a suction electrocautery device. The surgical site was copiously irrigated.  The mouth gag was removed.  The care of the patient was turned over to the anesthesiologist.  The patient was awakened from anesthesia without difficulty.  The patient was extubated and transferred to the recovery room in good  condition.  OPERATIVE FINDINGS:  Post tonsillectomy hemorrhage from the right tonsillar fossa.  SPECIMEN:  None  FOLLOWUP CARE:  The patient will be discharged home once awake and alert.  The patient will follow up in my office in approximately 1 week.  Emiliya Chretien W Zakiah Gauthreaux 06/05/2017 3:28 AM

## 2017-06-05 NOTE — Anesthesia Postprocedure Evaluation (Signed)
Anesthesia Post Note  Patient: Amy ChandlerJennifer Reese  Procedure(s) Performed: Procedure(s) (LRB): control of oropharyngeal hemorrhage (N/A)     Patient location during evaluation: PACU Anesthesia Type: General Level of consciousness: awake and alert Pain management: pain level controlled Vital Signs Assessment: post-procedure vital signs reviewed and stable Respiratory status: spontaneous breathing, nonlabored ventilation, respiratory function stable and patient connected to nasal cannula oxygen Cardiovascular status: blood pressure returned to baseline and stable Postop Assessment: no signs of nausea or vomiting Anesthetic complications: no    Last Vitals:  Vitals:   06/05/17 0445 06/05/17 0517  BP:  (!) 107/50  Pulse: 98 (!) 101  Resp: (!) 5 16  Temp:  36.9 C    Last Pain:  Vitals:   06/05/17 0649  TempSrc:   PainSc: 5                  [name]

## 2017-06-05 NOTE — Progress Notes (Signed)
Discharge information reviewed with patient. All questions answered at this time. Transport home by family.   Sim BoastHavy, RN

## 2017-06-05 NOTE — Anesthesia Procedure Notes (Signed)
Procedure Name: Intubation Date/Time: 06/05/2017 3:10 AM Performed by: Molli HazardGORDON, Amy Reese Pre-anesthesia Checklist: Patient identified, Suction available, Emergency Drugs available and Patient being monitored Patient Re-evaluated:Patient Re-evaluated prior to induction Oxygen Delivery Method: Circle system utilized Preoxygenation: Pre-oxygenation with 100% oxygen Induction Type: IV induction, Rapid sequence and Cricoid Pressure applied Laryngoscope Size: Miller and 2 Grade View: Grade II Tube type: Oral Tube size: 7.0 mm Number of attempts: 1 Airway Equipment and Method: Stylet Placement Confirmation: ETT inserted through vocal cords under direct vision,  positive ETCO2 and breath sounds checked- equal and bilateral ETT to lip (cm): marked at 21cm. Tube secured with: Tape Dental Injury: Teeth and Oropharynx as per pre-operative assessment

## 2017-06-05 NOTE — ED Triage Notes (Signed)
Pt brought in by EMS for c/o oral bleeding  Pt had her tonsils out a week ago and is having bleeding  [facility]  Pt states the bleeding started again tonight  Pt states she feels like she has a clot stuck in her throat

## 2017-06-05 NOTE — Transfer of Care (Signed)
Immediate Anesthesia Transfer of Care Note  Patient: Westley ChandlerJennifer Frohlich  Procedure(s) Performed: Procedure(s): control of oropharyngeal hemorrhage (N/A)  Patient Location: PACU  Anesthesia Type:General  Level of Consciousness: awake, alert  and oriented  Airway & Oxygen Therapy: Patient connected to nasal cannula oxygen  Post-op Assessment: Report given to RN and Post -op Vital signs reviewed and stable  Post vital signs: Reviewed and stable  Last Vitals:  Vitals:   06/05/17 0335  Temp: (!) 36.2 C    Last Pain: There were no vitals filed for this visit.       Complications: No apparent anesthesia complications

## 2017-06-05 NOTE — Discharge Instructions (Signed)
Amy Reese Amy Reese M.D., P.A. °Postoperative Instructions for Tonsillectomy & Adenoidectomy (T&A) °Activity °Restrict activity at home for the first two days, resting as much as possible. Light indoor activity is best. You may usually return to school or work within a week but void strenuous activity and sports for two weeks. Sleep with your head elevated on 2-3 pillows for 3-4 days to help decrease swelling. °Diet °Due to tissue swelling and throat discomfort, you may have little desire to drink for several days. However fluids are very important to prevent dehydration. You will find that non-acidic juices, soups, popsicles, Jell-O, custard, puddings, and any soft or mashed foods taken in small quantities can be swallowed fairly easily. Try to increase your fluid and food intake as the discomfort subsides. It is recommended that a child receive 1-1/2 quarts of fluid in a 24-hour period. Adult require twice this amount.  °Discomfort °Your sore throat may be relieved by applying an ice collar to your neck and/or by taking Tylenol®. You may experience an earache, which is due to referred pain from the throat. Referred ear pain is commonly felt at night when trying to rest. ° °Bleeding                        Although rare, there is risk of having some bleeding during the first 2 weeks after having a T&A. This usually happens between days 7-10 postoperatively. If you or your child should have any bleeding, try to remain calm. We recommend sitting up quietly in a chair and gently spitting out the blood into a bowl. For adults, gargling gently with ice water may help. If the bleeding does not stop after a short time (5 minutes), is more than 1 teaspoonful, or if you become worried, please call our office at [phone] or go directly to the nearest hospital emergency room. Do not eat or drink anything prior to going to the hospital as you may need to be taken to the operating room in order to control the bleeding. °GENERAL  CONSIDERATIONS °1. Brush your teeth regularly. Avoid mouthwashes and gargles for three weeks. You may gargle gently with warm salt-water as necessary or spray with Chloraseptic®. You may make salt-water by placing 2 teaspoons of table salt into a quart of fresh water. Warm the salt-water in a microwave to a luke warm temperature.  °2. Avoid exposure to colds and upper respiratory infections if possible.  °3. If you look into a mirror or into your child's mouth, you will see white-gray patches in the back of the throat. This is normal after having a T&A and is like a scab that forms on the skin after an abrasion. It will disappear once the back of the throat heals completely. However, it may cause a noticeable odor; this too will disappear with time. Again, warm salt-water gargles may be used to help keep the throat clean and promote healing.  °4. You may notice a temporary change in voice quality, such as a higher pitched voice or a nasal sound, until healing is complete. This may last for 1-2 weeks and should resolve.  °5. Do not take or give you child any medications that we have not prescribed or recommended.  °6. Snoring may occur, especially at night, for the first week after a T&A. It is due to swelling of the soft palate and will usually resolve.  °Please call our office at [phone] if you have any questions.   °

## 2017-06-06 ENCOUNTER — Encounter (HOSPITAL_COMMUNITY): Payer: Self-pay | Admitting: Otolaryngology

## 2017-06-13 ENCOUNTER — Ambulatory Visit (INDEPENDENT_AMBULATORY_CARE_PROVIDER_SITE_OTHER): Payer: 59 | Admitting: Otolaryngology

## 2017-07-24 ENCOUNTER — Ambulatory Visit (INDEPENDENT_AMBULATORY_CARE_PROVIDER_SITE_OTHER): Payer: 59

## 2017-07-24 ENCOUNTER — Encounter: Payer: Self-pay | Admitting: Orthopedic Surgery

## 2017-07-24 ENCOUNTER — Ambulatory Visit (INDEPENDENT_AMBULATORY_CARE_PROVIDER_SITE_OTHER): Payer: 59 | Admitting: Orthopedic Surgery

## 2017-07-24 ENCOUNTER — Ambulatory Visit: Payer: 59

## 2017-07-24 VITALS — BP 158/105 | HR 85 | Resp 18 | Ht 66.0 in | Wt 348.0 lb

## 2017-07-24 DIAGNOSIS — G8929 Other chronic pain: Secondary | ICD-10-CM

## 2017-07-24 DIAGNOSIS — M25561 Pain in right knee: Secondary | ICD-10-CM | POA: Diagnosis not present

## 2017-07-24 DIAGNOSIS — M25562 Pain in left knee: Principal | ICD-10-CM

## 2017-07-24 MED ORDER — MELOXICAM 7.5 MG PO TABS: 7.5000 mg | ORAL_TABLET | Freq: Every day | ORAL | 2 refills | Status: DC

## 2017-07-24 MED ORDER — METHYLPREDNISOLONE ACETATE 40 MG/ML IJ SUSP
40.0000 mg | Freq: Once | INTRAMUSCULAR | Status: AC
  Administered 2017-07-24 (×2): 40 mg via INTRA_ARTICULAR

## 2017-07-24 NOTE — Progress Notes (Signed)
Patient ID: Amy Reese, female   DOB: 09-26-1976, 41 y.o.   MRN: [id number]  Chief Complaint  Patient presents with  . Knee Pain    bilateral knees painful right has been more painful, but now has left knee pain getting worse past 2 weeks     41 year old female referred to [place] by Dr. Roselyn Reef  She complains of bilateral knee pain dull aching grinding occasionally sharp appears to be activity related. She complains of weakness when getting out of a chair. Most of her pain is anterior and lateral joint line of both knees  Occasionally she will have some giving way symptoms. She also has complaints of lower back pain.  Right knee painful for 6 months left knee painful for 2 weeks    Review of Systems  Constitutional: Positive for weight loss.  Musculoskeletal: Positive for back pain, falls and joint pain.  Neurological: Negative for tingling.    Past Medical History:  Diagnosis Date  . Anxiety   . Bipolar disorder (HCC)   . Depression   . Diabetes mellitus without complication (HCC)    A1c 8.8 05/24/17  . Endometriosis   . Hypertension   . Obesity   . Polycystic ovarian disease     Past Surgical History:  Procedure Laterality Date  . ABDOMINAL HYSTERECTOMY    . TONSILLECTOMY N/A 06/05/2017   Procedure: control of oropharyngeal hemorrhage;  Surgeon: Newman Pies, MD;  Location: [facility] OR;  Service: ENT;  Laterality: N/A;  . TONSILLECTOMY AND ADENOIDECTOMY N/A 05/29/2017   Procedure: TONSILLECTOMY AND ADENOIDECTOMY;  Surgeon: Newman Pies, MD;  Location: [facility] OR;  Service: ENT;  Laterality: N/A;    PHYSICAL EXAM  BP (!) 158/105   Pulse 85   Resp 18   Ht  (1.676 m)   Wt (!) 348 lb (157.9 kg)   BMI 56.17 kg/m  GENERAL appearance reveals no gross abnormalities, normal development grooming and hygiene   MENTAL STATUS we note that the patient is awake alert and oriented to person place and time MOOD/AFFECT ARE NORMAL   GAIT reveals No associated limp on either leg   Left Knee  Exam   Tenderness  The patient is experiencing tenderness in the lateral joint line, patella.  Range of Motion  Normal left knee ROM Extension: 0 Flexion:     120  Muscle Strength  Normal left knee strength  Tests  Drawer:       Anterior - Positive       Right Knee Exam  Swelling: None Effusion: No  Tenderness  The patient is experiencing tenderness in the lateral joint line, patella.  Range of Motion  Extension: 0 Flexion:     120  Muscle Strength  Normal right knee strength  Tests  Drawer:       Anterior - Negative      Comments:  Neurovascular exam is normal    VASC 2+ dorsalis pedis pulse normal capillary refill excellent warmth to the extremity  NEURO normal sensation and no pathologic reflexes  LYMPH deferred noncontributory   IMAGING STUDIES  I have independently reviewed the x-rays and I interpreted the x-rays as follows:  Right knee shows osteoarthritis patellofemoral joint moderate left knee show same knee is in valgus bilaterally  Dx   primary osteoarthritis of the  both right and left knee  PLAN   Inject both knees Start Mobic Weight loss Knee exercises   Procedure note left knee injection verbal consent was obtained to inject left  knee joint  Timeout was completed to confirm the site of injection  The medications used were 40 mg of Depo-Medrol and 1% lidocaine 3 cc  Anesthesia was provided by ethyl chloride and the skin was prepped with alcohol.  After cleaning the skin with alcohol a 20-gauge needle was used to inject the left knee joint. There were no complications. A sterile bandage was applied.   Procedure note right knee injection verbal consent was obtained to inject right knee joint  Timeout was completed to confirm the site of injection  The medications used were 40 mg of Depo-Medrol and 1% lidocaine 3 cc  Anesthesia was provided by ethyl chloride and the skin was prepped with alcohol.  After cleaning the skin with  alcohol a 20-gauge needle was used to inject the right knee joint. There were no complications. A sterile bandage was applied.  No scheduled follow-up. No surgery needed at this time  11:38 AM Fuller Canada, MD 07/24/2017

## 2017-07-24 NOTE — Patient Instructions (Addendum)
KNEE ARTHRITIS:   TOPICAL CREAMS FOR ARTHRITIS:  OVER THE COUNTER  BENGAY ICY HOT ASPERCREME MYOFLEX CAPZACIN   PRESCRIPTION  VOLTAREN GEL  Knee Pain, Adult Many things can cause knee pain. The pain often goes away on its own with time and rest. If the pain does not go away, tests may be done to find out what is causing the pain. Follow these instructions at home: Activity  Rest your knee.  Do not do things that cause pain.  Avoid activities where both feet leave the ground at the same time (high-impact activities). Examples are running, jumping rope, and doing jumping jacks. General instructions  Take medicines only as told by your doctor.  Raise (elevate) your knee when you are resting. Make sure your knee is higher than your heart.  Sleep with a pillow under your knee.  If told, put ice on the knee: ? Put ice in a plastic bag. ? Place a towel between your skin and the bag. ? Leave the ice on for 20 minutes, 2-3 times a day.  Ask your doctor if you should wear an elastic knee support.  Lose weight if you are overweight. Being overweight can make your knee hurt more.  Do not use any tobacco products. These include cigarettes, chewing tobacco, or electronic cigarettes. If you need help quitting, ask your doctor. Smoking may slow down healing. Summary  Many things can cause knee pain. The pain often goes away on its own with time and rest.  Avoid activities that put stress on your knee. These include running and jumping rope.  Get help right away if you cannot move your knee, or if your knee feels warm, or if you have trouble breathing. This information is not intended to replace advice given to you by your health care provider. Make sure you discuss any questions you have with your health care provider. Document Released: 01/18/2009 Document Revised: 10/16/2016 Document Reviewed: 10/16/2016 Elsevier Interactive Patient Education  2017 Elsevier  Inc.    Osteoarthritis Osteoarthritis is a type of arthritis that affects tissue that covers the ends of bones in joints (cartilage). Cartilage acts as a cushion between the bones and helps them move smoothly. Osteoarthritis results when cartilage in the joints gets worn down. Osteoarthritis is sometimes called "wear and tear" arthritis. Osteoarthritis is the most common form of arthritis. It often occurs in older people. It is a condition that gets worse over time (a progressive condition). Joints that are most often affected by this condition are in:  Fingers.  Toes.  Hips.  Knees.  Spine, including neck and lower back.  What are the causes? This condition is caused by age-related wearing down of cartilage that covers the ends of bones. What increases the risk? The following factors may make you more likely to develop this condition:  Older age.  Being overweight or obese.  Overuse of joints, such as in athletes.  Past injury of a joint.  Past surgery on a joint.  Family history of osteoarthritis.  What are the signs or symptoms? The main symptoms of this condition are pain, swelling, and stiffness in the joint. The joint may lose its shape over time. Small pieces of bone or cartilage may break off and float inside of the joint, which may cause more pain and damage to the joint. Small deposits of bone (osteophytes) may grow on the edges of the joint. Other symptoms may include:  A grating or scraping feeling inside the joint when you move  it.  Popping or creaking sounds when you move.  Symptoms may affect one or more joints. Osteoarthritis in a major joint, such as your knee or hip, can make it painful to walk or exercise. If you have osteoarthritis in your hands, you might not be able to grip items, twist your hand, or control small movements of your hands and fingers (fine motor skills). How is this diagnosed? This condition may be diagnosed based on:  Your medical  history.  A physical exam.  Your symptoms.  X-rays of the affected joint(s).  Blood tests to rule out other types of arthritis.  How is this treated? There is no cure for this condition, but treatment can help to control pain and improve joint function. Treatment plans may include:  A prescribed exercise program that allows for rest and joint relief. You may work with a physical therapist.  A weight control plan.  Pain relief techniques, such as: ? Applying heat and cold to the joint. ? Electric pulses delivered to nerve endings under the skin (transcutaneous electrical nerve stimulation, or TENS). ? Massage. ? Certain nutritional supplements.  NSAIDs or prescription medicines to help relieve pain.  Medicine to help relieve pain and inflammation (corticosteroids). This can be given by mouth (orally) or as an injection.  Assistive devices, such as a brace, wrap, splint, specialized glove, or cane.  Surgery, such as: ? An osteotomy. This is done to reposition the bones and relieve pain or to remove loose pieces of bone and cartilage. ? Joint replacement surgery. You may need this surgery if you have very bad (advanced) osteoarthritis.  Follow these instructions at home: Activity  Rest your affected joints as directed by your health care provider.  Do not drive or use heavy machinery while taking prescription pain medicine.  Exercise as directed. Your health care provider or physical therapist may recommend specific types of exercise, such as: ? Strengthening exercises. These are done to strengthen the muscles that support joints that are affected by arthritis. They can be performed with weights or with exercise bands to add resistance. ? Aerobic activities. These are exercises, such as brisk walking or water aerobics, that get your heart pumping. ? Range-of-motion activities. These keep your joints easy to move. ? Balance and agility exercises. Managing pain, stiffness, and  swelling  If directed, apply heat to the affected area as often as told by your health care provider. Use the heat source that your health care provider recommends, such as a moist heat pack or a heating pad. ? If you have a removable assistive device, remove it as told by your health care provider. ? Place a towel between your skin and the heat source. If your health care provider tells you to keep the assistive device on while you apply heat, place a towel between the assistive device and the heat source. ? Leave the heat on for 20-30 minutes. ? Remove the heat if your skin turns bright red. This is especially important if you are unable to feel pain, heat, or cold. You may have a greater risk of getting burned.  If directed, put ice on the affected joint: ? If you have a removable assistive device, remove it as told by your health care provider. ? Put ice in a plastic bag. ? Place a towel between your skin and the bag. If your health care provider tells you to keep the assistive device on during icing, place a towel between the assistive device and the bag. ?  Leave the ice on for 20 minutes, 2-3 times a day. General instructions  Take over-the-counter and prescription medicines only as told by your health care provider.  Maintain a healthy weight. Follow instructions from your health care provider for weight control. These may include dietary restrictions.  Do not use any products that contain nicotine or tobacco, such as cigarettes and e-cigarettes. These can delay bone healing. If you need help quitting, ask your health care provider.  Use assistive devices as directed by your health care provider.  Keep all follow-up visits as told by your health care provider. This is important. Where to find more information:  [employer] of Arthritis and Musculoskeletal and Skin Diseases: www.niams.http://www.myers.net/  [employer] on Aging: https://walker.com/  American College of Rheumatology:  www.rheumatology.org Contact a health care provider if:  Your skin turns red.  You develop a rash.  You have pain that gets worse.  You have a fever along with joint or muscle aches. Get help right away if:  You lose a lot of weight.  You suddenly lose your appetite.  You have night sweats. Summary  Osteoarthritis is a type of arthritis that affects tissue covering the ends of bones in joints (cartilage).  This condition is caused by age-related wearing down of cartilage that covers the ends of bones.  The main symptom of this condition is pain, swelling, and stiffness in the joint.  There is no cure for this condition, but treatment can help to control pain and improve joint function. This information is not intended to replace advice given to you by your health care provider. Make sure you discuss any questions you have with your health care provider. Document Released: 10/22/2005 Document Revised: 06/25/2016 Document Reviewed: 06/25/2016 Elsevier Interactive Patient Education  2018 [employer].   Generic Knee Exercises EXERCISES Do 3 sets of 10 repetitions of each exercise daily STRENGTHENING EXERCISES These exercises may help you when beginning to rehabilitate your injury. They may resolve your symptoms with or without further involvement from your physician, physical therapist, or athletic trainer. While completing these exercises, remember:   Muscles can gain both the endurance and the strength needed for everyday activities through controlled exercises.  Complete these exercises as instructed by your physician, physical therapist, or athletic trainer. Progress the resistance and repetitions only as guided.  You may experience muscle soreness or fatigue, but the pain or discomfort you are trying to eliminate should never worsen during these exercises. If this pain does worsen, stop and make certain you are following the directions exactly. If the pain is still present  after adjustments, discontinue the exercise until you can discuss the trouble with your clinician. STRENGTH - Quadriceps, Isometrics  Lie on your back with your right / left leg extended and your opposite knee bent.  Gradually tense the muscles in the front of your right / left thigh. You should see either your knee cap slide up toward your hip or increased dimpling just above the knee. This motion will push the back of the knee down toward the floor/mat/bed on which you are lying.  Relax the muscles slowly and completely in between each repetition. STRENGTH - Quadriceps, Short Arcs   Lie on your back. Place a rolled under your knee so that the knee slightly bends.  Raise only your lower leg by tightening the muscles in the front of your thigh. Do not allow your thigh to rise. STRENGTH - Quadriceps, Straight Leg Raises  Quality counts! Watch for signs that  the quadriceps muscle is working to insure you are strengthening the correct muscles and not "cheating" by substituting with healthier muscles.  Lay on your back with your right / left leg extended and your opposite knee bent.  Tense the muscles in the front of your right / left thigh. You should see either your knee cap slide up or increased dimpling just above the knee. Your thigh may even quiver.  Tighten these muscles even more and raise your leg 4 to 6 inches off the floor. Keeping these muscles tense, lower your leg.  Relax the muscles slowly and completely in between each repetition. STRENGTH - Hamstring, Curls  Lay on your stomach with your legs extended. (If you lay on a bed, your feet may hang over the edge.)  Tighten the muscles in the back of your thigh to bend your right / left knee up to 90 degrees. Keep your hips flat on the bed/floor.  Slowly lower your leg back to the starting position. STRENGTH - Quadriceps, Squats  Stand in a door frame so that your feet and knees are in line with the frame.  Use your hands for  balance, not support, on the frame.  Slowly lower your weight, bending at the hips and knees. Keep your lower legs upright so that they are parallel with the door frame. Squat only within the range that does not increase your knee pain. Never let your hips drop below your knees.  Slowly return upright, pushing with your legs, not pulling with your hands. STRENGTH - Quadriceps, Wall Slides  Follow guidelines for form closely. Increased knee pain often results from poorly placed feet or knees.  Lean against a smooth wall or door and walk your feet out 18-24 inches. Place your feet hip-width apart.  Slowly slide down the wall or door until your knees bend ___30______ degrees.* Keep your knees over your heels, not your toes, and in line with your hips, not falling to either side.

## 2017-08-12 ENCOUNTER — Telehealth: Payer: Self-pay | Admitting: Orthopedic Surgery

## 2017-08-12 ENCOUNTER — Other Ambulatory Visit: Payer: Self-pay | Admitting: Orthopedic Surgery

## 2017-08-12 MED ORDER — DICLOFENAC SODIUM 1 % TD GEL: 4.0000 g | Freq: Four times a day (QID) | TRANSDERMAL | 3 refills | Status: DC

## 2017-08-12 NOTE — Telephone Encounter (Signed)
Patient states she thought that she was to have gotten a prescription for Voltaren Gel but her pharmacy says they did not receive this.      Would you please send this to [facility]?  Thanks

## 2017-08-13 ENCOUNTER — Telehealth: Payer: Self-pay | Admitting: Orthopedic Surgery

## 2017-08-13 NOTE — Telephone Encounter (Signed)
Pt cant afford Voltaren gel (273.00) and pharamcy wants ok to do compounded Diclofenac 5% cream

## 2017-08-14 ENCOUNTER — Other Ambulatory Visit: Payer: Self-pay | Admitting: Orthopedic Surgery

## 2017-08-14 DIAGNOSIS — M545 Low back pain: Secondary | ICD-10-CM

## 2017-08-14 NOTE — Telephone Encounter (Signed)
yes

## 2017-08-24 ENCOUNTER — Ambulatory Visit
Admission: RE | Admit: 2017-08-24 | Discharge: 2017-08-24 | Disposition: A | Discharge status: Home / Self Care | Payer: 59 | Source: Ambulatory Visit | Attending: Orthopedic Surgery | Admitting: Orthopedic Surgery

## 2017-08-24 DIAGNOSIS — M545 Low back pain: Secondary | ICD-10-CM

## 2017-08-26 ENCOUNTER — Telehealth: Payer: Self-pay | Admitting: Orthopedic Surgery

## 2017-08-26 NOTE — Telephone Encounter (Signed)
Called patient to advise her I have a coupon if she wants / good Rx she can get Voltaren gel for $25.75 to try / she was not home, her mother has asked me to call back later this pm.

## 2017-08-26 NOTE — Telephone Encounter (Signed)
Patient called to relay she is having increased knee pain, mainly left. States had injections 07/24/17. Mentioned also has just had MRI of her back, ordered by another provider.  Patient said she is taking the anti-inflammatory medication prescribed by Dr Romeo AppleHarrison, however, never was able to get the topical gel or cream, due to the cost.  Asking if she is to come back for another appointment or if any other recommendations?  Ph# [phone]

## 2017-08-26 NOTE — Telephone Encounter (Signed)
Patient states she still is unable to afford the compound cream, as it will still cost over $80.00.

## 2017-08-26 NOTE — Telephone Encounter (Signed)
I spoke to patient, she will p/u coupon tomorrow, I made appt for her, since she asked to come in, pain has worsened

## 2017-08-27 ENCOUNTER — Ambulatory Visit (INDEPENDENT_AMBULATORY_CARE_PROVIDER_SITE_OTHER): Payer: 59 | Admitting: Orthopedic Surgery

## 2017-08-27 ENCOUNTER — Encounter: Payer: Self-pay | Admitting: Orthopedic Surgery

## 2017-08-27 VITALS — BP 139/103 | HR 88 | Ht 67.0 in | Wt 343.0 lb

## 2017-08-27 DIAGNOSIS — M23322 Other meniscus derangements, posterior horn of medial meniscus, left knee: Secondary | ICD-10-CM | POA: Diagnosis not present

## 2017-08-27 DIAGNOSIS — M25562 Pain in left knee: Secondary | ICD-10-CM | POA: Diagnosis not present

## 2017-08-27 NOTE — Progress Notes (Signed)
Progress Note   Patient ID: Amy Reese, female   DOB: April 26, 1976, 41 y.o.   MRN: [id number]  Chief Complaint  Patient presents with  . Knee Pain    bilateral    41 year old female with bilateral knee pain status post bilateral knee injections right did well left knee still hurting. She complains of popping sensation in the left knee and pain behind the knee in the popliteal fossa. She says when she tries to straighten her knee she can't straighten it. She has noticed some swelling on the left side She was unable to get the Voltaren gel because of the cost but we do have a coupon for her and she will try to get it as soon as she can       Review of Systems  Musculoskeletal: Positive for back pain.       Yesterday she had MRI of her back and got the results which show scoliosis and arthritis   No outpatient prescriptions have been marked as taking for the 08/27/17 encounter (Office Visit) with Vickki HearingHarrison, Stanley E, MD.    Allergies  Allergen Reactions  . Bee Venom Anaphylaxis and Swelling     BP (!) 139/103   Pulse 88   Ht 5\' 7"  (1.702 m)   Wt (!) 343 lb (155.6 kg)   BMI 53.72 kg/m   Physical Exam   Gen. appearance the patient's appearance is normal with normal grooming and  hygiene The patient is oriented to person place and time Mood and affect are normal  BP (!) 139/103   Pulse 88   Ht 5\' 7"  (1.702 m)   Wt (!) 343 lb (155.6 kg)   BMI 53.72 kg/m  Ortho Exam Left knee 20 lack of extension flexion 110 Small joint effusion Gait is remarkable for slight limp favoring the left side   Medical decision-making Encounter Diagnoses  Name Primary?  . Acute pain of left knee   . Derangement of posterior horn of medial meniscus of left knee Yes      Recommend MRI left knee as she has had a trial of conservative care greater than 4 weeks. She had injection. Recommend MRI to rule out an intra-articular cause of her persistent left knee pain   Fuller CanadaStanley Harrison,  MD 08/27/2017 11:29 AM

## 2017-08-27 NOTE — Patient Instructions (Addendum)
MRI scan has been ordered for you, we will get approval for the scan if needed, then call you with appointment. We will also schedule a follow up appointment for you when we call you. If you do not hear anything within one week, please contact our office.   Take you Klonopin 1 hour prior to the MRI scan   Smoking Tobacco Information Smoking tobacco will very likely harm your health. Tobacco contains a poisonous (toxic), colorless chemical called nicotine. Nicotine affects the brain and makes tobacco addictive. This change in your brain can make it hard to stop smoking. Tobacco also has other toxic chemicals that can hurt your body and raise your risk of many cancers. How can smoking tobacco affect me? Smoking tobacco can increase your chances of having serious health conditions, such as:  Cancer. Smoking is most commonly associated with lung cancer, but can lead to cancer in other parts of the body.  Chronic obstructive pulmonary disease (COPD). This is a long-term lung condition that makes it hard to breathe. It also gets worse over time.  High blood pressure (hypertension), heart disease, stroke, or heart attack.  Lung infections, such as pneumonia.  Cataracts. This is when the lenses in the eyes become clouded.  Digestive problems. This may include peptic ulcers, heartburn, and gastroesophageal reflux disease (GERD).  Oral health problems, such as gum disease and tooth loss.  Loss of taste and smell.  Smoking can affect your appearance by causing:  Wrinkles.  Yellow or stained teeth, fingers, and fingernails.  Smoking tobacco can also affect your social life.  Many workplaces, [employer], hotels, and public places are tobacco-free. This means that you may experience challenges in finding places to smoke when away from home.  The cost of a smoking habit can be expensive. Expenses for someone who smokes come in two ways: ? You spend money on a regular basis to buy  tobacco. ? Your health care costs in the long-term are higher if you smoke.  Tobacco smoke can also affect the health of those around you. Children of smokers have greater chances of: ? Sudden infant death syndrome (SIDS). ? Ear infections. ? Lung infections.  What lifestyle changes can be made?  Do not start smoking. Quit if you already do.  To quit smoking: ? Make a plan to quit smoking and commit yourself to it. Look for programs to help you and ask your health care provider for recommendations and ideas. ? Talk with your health care provider about using nicotine replacement medicines to help you quit. Medicine replacement medicines include gum, lozenges, patches, sprays, or pills. ? Do not replace cigarette smoking with electronic cigarettes, which are commonly called e-cigarettes. The safety of e-cigarettes is not known, and some may contain harmful chemicals. ? Avoid places, people, or situations that tempt you to smoke. ? If you try to quit but return to smoking, don't give up hope. It is very common for people to try a number of times before they fully succeed. When you feel ready again, give it another try.  Quitting smoking might affect the way you eat as well as your weight. Be prepared to monitor your eating habits. Get support in planning and following a healthy diet.  Ask your health care provider about having regular tests (screenings) to check for cancer. This may include blood tests, imaging tests, and other tests.  Exercise regularly. Consider taking walks, joining a gym, or doing yoga or exercise classes.  Develop skills to manage your  stress. These skills include meditation. What are the benefits of quitting smoking? By quitting smoking, you may:  Lower your risk of getting cancer and other diseases caused by smoking.  Live longer.  Breathe better.  Lower your blood pressure and heart rate.  Stop your addiction to tobacco.  Stop creating secondhand smoke  that hurts other people.  Improve your sense of taste and smell.  Look better over time, due to having fewer wrinkles and less staining.  What can happen if changes are not made? If you do not stop smoking, you may:  Get cancer and other diseases.  Develop COPD or other long-term (chronic) lung conditions.  Develop serious problems with your heart and blood vessels (cardiovascular system).  Need more tests to screen for problems caused by smoking.  Have higher, long-term healthcare costs from medicines or treatments related to smoking.  Continue to have worsening changes in your lungs, mouth, and nose.  Where to find support: To get support to quit smoking, consider:  Asking your health care provider for more information and resources.  Taking classes to learn more about quitting smoking.  Looking for local organizations that offer resources about quitting smoking.  Joining a support group for people who want to quit smoking in your local community.  Where to find more information: You may find more information about quitting smoking from:  HelpGuide.org: www.helpguide.org/articles/addictions/how-to-quit-smoking.htm  BankRights.uySmokefree.gov: smokefree.gov  American Lung Association: www.lung.org  Contact a health care provider if:  You have problems breathing.  Your lips, nose, or fingers turn blue.  You have chest pain.  You are coughing up blood.  You feel faint or you pass out.  You have other noticeable changes that cause you to worry. Summary  Smoking tobacco can negatively affect your health, the health of those around you, your finances, and your social life.  Do not start smoking. Quit if you already do. If you need help quitting, ask your health care provider.  Think about joining a support group for people who want to quit smoking in your local community. There are many effective programs that will help you to quit this behavior. This information is not  intended to replace advice given to you by your health care provider. Make sure you discuss any questions you have with your health care provider. Document Released: 11/06/2016 Document Revised: 11/06/2016 Document Reviewed: 11/06/2016 Elsevier Interactive Patient Education  [employer]2018 Elsevier Inc.

## 2017-09-02 ENCOUNTER — Telehealth: Payer: Self-pay | Admitting: Radiology

## 2017-09-02 NOTE — Telephone Encounter (Signed)
I spoke to patient about the denial of MRI scan of her knee, denied due to no Physical therapy, asked her if she wants me to discuss physical therapy with Dr Romeo AppleHarrison. She states she does not want to proceed with the physical therapy, she is going to go to another orthopedist.

## 2017-09-02 NOTE — Telephone Encounter (Signed)
[facility] of the MRI scan, I will call patient to advise.

## 2017-09-09 ENCOUNTER — Emergency Department (HOSPITAL_COMMUNITY): Payer: 59

## 2017-09-09 ENCOUNTER — Emergency Department (HOSPITAL_COMMUNITY)
Admission: EM | Admit: 2017-09-09 | Discharge: 2017-09-09 | Disposition: A | Discharge status: Home / Self Care | Payer: 59 | Attending: Emergency Medicine | Admitting: Emergency Medicine

## 2017-09-09 ENCOUNTER — Encounter (HOSPITAL_COMMUNITY): Payer: Self-pay | Admitting: Emergency Medicine

## 2017-09-09 DIAGNOSIS — I10 Essential (primary) hypertension: Secondary | ICD-10-CM | POA: Diagnosis not present

## 2017-09-09 DIAGNOSIS — F17210 Nicotine dependence, cigarettes, uncomplicated: Secondary | ICD-10-CM | POA: Diagnosis not present

## 2017-09-09 DIAGNOSIS — M25562 Pain in left knee: Secondary | ICD-10-CM | POA: Diagnosis present

## 2017-09-09 DIAGNOSIS — E119 Type 2 diabetes mellitus without complications: Secondary | ICD-10-CM | POA: Diagnosis not present

## 2017-09-09 DIAGNOSIS — Z79899 Other long term (current) drug therapy: Secondary | ICD-10-CM | POA: Diagnosis not present

## 2017-09-09 HISTORY — DX: Pain in left knee: M25.562

## 2017-09-09 MED ORDER — OXYCODONE-ACETAMINOPHEN 5-325 MG PO TABS
1.0000 | ORAL_TABLET | Freq: Once | ORAL | Status: AC
  Administered 2017-09-09: 1 via ORAL
  Filled 2017-09-09: qty 1

## 2017-09-09 MED ORDER — OXYCODONE-ACETAMINOPHEN 5-325 MG PO TABS: 1.0000 | ORAL_TABLET | ORAL | 0 refills | Status: DC | PRN

## 2017-09-09 MED ORDER — IBUPROFEN 800 MG PO TABS
800.0000 mg | ORAL_TABLET | Freq: Once | ORAL | Status: AC
Start: 2017-09-09 — End: 2017-09-09
  Administered 2017-09-09: 800 mg via ORAL
  Filled 2017-09-09: qty 1

## 2017-09-09 MED ORDER — IBUPROFEN 800 MG PO TABS: 800.0000 mg | ORAL_TABLET | Freq: Three times a day (TID) | ORAL | 0 refills | Status: DC

## 2017-09-09 NOTE — ED Notes (Signed)
ED Provider at bedside. 

## 2017-09-09 NOTE — ED Provider Notes (Signed)
[facility] EMERGENCY DEPARTMENT Provider Note   CSN: [id number] Arrival date & time: 09/09/17  1551     History   Chief Complaint Chief Complaint  Patient presents with  . Knee Pain    HPI Madigan Rosensteel is a 41 y.o. female.  HPI  Desaree Downen is a 41 y.o. female who presents to the Emergency Department complaining of left knee pain for 2 months.  She states she was seen by orthopedics for right knee pain and received cortisone injections to both knees.  She developed pain to her left knee 4 days after her injection.  She described a sharp pain to the lateral aspect of her knee pain is worse with extension and with weightbearing.  Pain has now radiated to the anterior and lateral side of her knee. Denies known injury, redness, fever, chills, numbness of extremity, swelling and pain to the calf.    Past Medical History:  Diagnosis Date  . Anxiety   . Bipolar disorder (HCC)   . Depression   . Diabetes mellitus without complication (HCC)    A1c 8.8 05/24/17  . Endometriosis   . Hypertension   . Left knee pain   . Obesity   . Polycystic ovarian disease     Patient Active Problem List   Diagnosis Date Noted  . Hemorrhage of right tonsil 06/05/2017  . S/P T&A (status post tonsillectomy and adenoidectomy) 05/29/2017    Past Surgical History:  Procedure Laterality Date  . ABDOMINAL HYSTERECTOMY      OB History    Gravida Para Term Preterm AB Living   0 0 0 0 0 0   SAB TAB Ectopic Multiple Live Births   0 0 0 0 0       Home Medications    Prior to Admission medications   Medication Sig Start Date End Date Taking? Authorizing Provider  amLODipine (NORVASC) 5 MG tablet Take 1 tablet (5 mg total) by mouth daily. Patient not taking: Reported on 07/24/2017 02/21/16   Donnetta Hutching, MD  clonazePAM (KLONOPIN) 1 MG tablet Take 1 mg by mouth 2 (two) times daily as needed for anxiety.    [provider]  diclofenac sodium (VOLTAREN) 1 % GEL Apply 4 g topically 4  (four) times daily. 08/12/17   Vickki Hearing, MD  lamoTRIgine (LAMICTAL) 100 MG tablet Take 100 mg by mouth 2 (two) times daily.      [provider]  meloxicam (MOBIC) 7.5 MG tablet Take 1 tablet (7.5 mg total) by mouth daily. 07/24/17   Vickki Hearing, MD  oxyCODONE-acetaminophen (ROXICET) 5-325 MG tablet Take 1-2 tablets by mouth every 4 (four) hours as needed for severe pain. Patient not taking: Reported on 07/24/2017 06/05/17   Newman Pies, MD  venlafaxine XR (EFFEXOR-XR) 75 MG 24 hr capsule Take 75 mg by mouth daily.    [provider]    Family History History reviewed. No pertinent family history.  Social History Social History   Tobacco Use  . Smoking status: Current Every Day Smoker    Packs/day: 1.00    Types: Cigarettes  . Smokeless tobacco: Never Used  Substance Use Topics  . Alcohol use: No  . Drug use: No     Allergies   Bee venom   Review of Systems Review of Systems  Constitutional: Negative for chills and fever.  Genitourinary: Negative for difficulty urinating and dysuria.  Musculoskeletal: Positive for arthralgias. Negative for joint swelling.  Skin: Negative for color change and  wound.  Neurological: Negative for weakness and numbness.  All other systems reviewed and are negative.    Physical Exam Updated Vital Signs BP (!) 148/74 (BP Location: Right Arm)   Pulse (!) 104   Temp 98.5 F (36.9 C) (Oral)   Resp 18   Ht 5\' 6"  (1.676 m)   Wt (!) 158.8 kg (350 lb)   SpO2 97%   BMI 56.49 kg/m   Physical Exam  Constitutional: She is oriented to person, place, and time. She appears well-developed and well-nourished. No distress.  Cardiovascular: Normal rate, regular rhythm, normal heart sounds and intact distal pulses.  Pulmonary/Chest: Effort normal and breath sounds normal.  Musculoskeletal: She exhibits tenderness. She exhibits no edema or deformity.  ttp of the anterior and lateral left knee.  No erythema, effusion, or  step-off deformity. Neg drawer sign. Calf is soft and NT.  Neurological: She is alert and oriented to person, place, and time. She exhibits normal muscle tone. Coordination normal.  Skin: Skin is warm and dry. No erythema.  Nursing note and vitals reviewed.    ED Treatments / Results  Labs (all labs ordered are listed, but only abnormal results are displayed) Labs Reviewed - No data to display  EKG  EKG Interpretation None       Radiology Ct Knee Left Wo Contrast  Result Date: 09/09/2017 CLINICAL DATA:  Chronic knee pain EXAM: CT OF THE left KNEE WITHOUT CONTRAST TECHNIQUE: Multidetector CT imaging of the left knee was performed according to the standard protocol. Multiplanar CT image reconstructions were also generated. COMPARISON:  Radiographs 07/24/2017 FINDINGS: Bones/Joint/Cartilage No fracture, periostitis, or bone destruction is evident. Mild lateral subluxation of the patella. Moderate joint space narrowing and prominent osteophytes along the medial and lateral joint margins with prominent spurring. Tibial spine osteophytes. Mild to moderate patellofemoral degenerative change. 5 mm calcific density within the lateral joint space with additional 5 mm calcific density within the posterior joint space adjacent to the medial femoral condyles. Moderate suprapatellar joint effusion. Subchondral sclerosis involving the distal femur and proximal tibia, more pronounced on the medial side. Ligaments Suboptimally assessed by CT. Muscles and Tendons Normal muscle girth. No intramuscular fluid collections. Quadriceps and patellar tendons appear grossly intact. Soft tissues Mild generalized soft tissue edema over the anterior knee. IMPRESSION: 1. No fracture.  Mild lateral subluxation of the patella. 2. Moderate tricompartment arthritis with at least 2 suspected calcified loose bodies within the medial aspect of the lateral joint space and along the posterior aspect of the knee joint. Moderate  suprapatellar joint effusion with mild generalized soft tissue edema anterior to the knee. Electronically Signed   By: Jasmine Pang M.D.   On: 09/09/2017 19:31    Procedures Procedures (including critical care time)  Medications Ordered in ED Medications - No data to display   Initial Impression / Assessment and Plan / ED Course  I have reviewed the triage vital signs and the nursing notes.  Pertinent labs & imaging results that were available during my care of the patient were reviewed by me and considered in my medical decision making (see chart for details).     No concerning sx's for septic joint.  Pt currently followed by orthopedics for back pain and opposing knee pain as well.  Appears stable for d/c.  ACE wrap applied for support, wear instructions provided.   Final Clinical Impressions(s) / ED Diagnoses   Final diagnoses:  Acute pain of left knee    ED Discharge Orders  None       Pauline Ausriplett, Ren Aspinall, PA-C 09/12/17 1328    Bethann BerkshireZammit, Joseph, MD 09/16/17 1229

## 2017-09-09 NOTE — Discharge Instructions (Signed)
Apply ice packs on/off to your knee.  Wear the ace wrap as needed, but not continuously or to bed.  Follow-up with your orthopedic provider

## 2017-09-09 NOTE — ED Triage Notes (Signed)
PT c/o chronic knee pain and had cortisone injections to bilateral knees x2 months ago. PT states worsening pain to left knee since injection.

## 2017-09-09 NOTE — ED Notes (Signed)
Pt gone to CT 

## 2017-09-16 ENCOUNTER — Other Ambulatory Visit: Payer: Self-pay | Admitting: Orthopedic Surgery

## 2017-09-17 ENCOUNTER — Other Ambulatory Visit: Payer: Self-pay | Admitting: Orthopedic Surgery

## 2017-09-19 ENCOUNTER — Encounter (HOSPITAL_COMMUNITY): Payer: Self-pay

## 2017-09-19 ENCOUNTER — Encounter (HOSPITAL_COMMUNITY)
Admission: RE | Admit: 2017-09-19 | Discharge: 2017-09-19 | Disposition: A | Discharge status: Home / Self Care | Payer: 59 | Source: Ambulatory Visit | Attending: Orthopedic Surgery | Admitting: Orthopedic Surgery

## 2017-09-19 ENCOUNTER — Other Ambulatory Visit: Payer: Self-pay

## 2017-09-19 DIAGNOSIS — Z7984 Long term (current) use of oral hypoglycemic drugs: Secondary | ICD-10-CM | POA: Diagnosis not present

## 2017-09-19 DIAGNOSIS — M79662 Pain in left lower leg: Secondary | ICD-10-CM | POA: Insufficient documentation

## 2017-09-19 DIAGNOSIS — Z79899 Other long term (current) drug therapy: Secondary | ICD-10-CM | POA: Diagnosis not present

## 2017-09-19 DIAGNOSIS — Z01818 Encounter for other preprocedural examination: Secondary | ICD-10-CM | POA: Insufficient documentation

## 2017-09-19 HISTORY — DX: Unspecified osteoarthritis, unspecified site: M19.90

## 2017-09-19 HISTORY — DX: Other allergy status, other than to drugs and biological substances: Z91.09

## 2017-09-19 HISTORY — DX: Presence of spectacles and contact lenses: Z97.3

## 2017-09-19 LAB — CBC
HEMATOCRIT: 45.9 % (ref 36.0–46.0)
Hemoglobin: 15.2 g/dL — ABNORMAL HIGH (ref 12.0–15.0)
MCH: 30.9 pg (ref 26.0–34.0)
MCHC: 33.1 g/dL (ref 30.0–36.0)
MCV: 93.3 fL (ref 78.0–100.0)
Platelets: 240 10*3/uL (ref 150–400)
RBC: 4.92 MIL/uL (ref 3.87–5.11)
RDW: 13.6 % (ref 11.5–15.5)
WBC: 8.9 10*3/uL (ref 4.0–10.5)

## 2017-09-19 LAB — BASIC METABOLIC PANEL
Anion gap: 8 (ref 5–15)
BUN: 13 mg/dL (ref 6–20)
CALCIUM: 9.1 mg/dL (ref 8.9–10.3)
CO2: 27 mmol/L (ref 22–32)
Chloride: 101 mmol/L (ref 101–111)
Creatinine, Ser: 0.75 mg/dL (ref 0.44–1.00)
GFR calc Af Amer: >60 mL/min (ref >60)
GLUCOSE: 232 mg/dL — AB (ref 65–99)
POTASSIUM: 4.1 mmol/L (ref 3.5–5.1)
SODIUM: 136 mmol/L (ref 135–145)

## 2017-09-19 LAB — HEMOGLOBIN A1C
Hgb A1c MFr Bld: 7 % — ABNORMAL HIGH (ref 4.8–5.6)
MEAN PLASMA GLUCOSE: 154.2 mg/dL

## 2017-09-19 LAB — GLUCOSE, CAPILLARY: Glucose-Capillary: 179 mg/dL — ABNORMAL HIGH (ref 65–99)

## 2017-09-19 NOTE — Pre-Procedure Instructions (Addendum)
Amy ChandlerJennifer Reese  09/19/2017      [id number] [place]- [place], [place] [phone] [facility] [address] [place] [id number] Phone: [phone] Fax: [phone]  [facility] - [place], [place] - [address]  [place] [id number] Phone: [phone] Fax: [phone]    Your procedure is scheduled on Monday, September 23, 2017  [facility] Admitting at 5:30 A.M.  Call this number if you have problems the morning of surgery:  [phone]   Remember:  Do not eat food or drink liquids after midnight Sunday, Nov. 18  Take these medicines the morning of surgery with A SIP OF WATER : amLODipine (NORVASC), if needed: clonazePAM (KLONOPIN) for anxiety, oxyCODONE-acetaminophen (PERCOCET/ROXICET) for pain Stop taking Aspirin, vitamins, fish oil  and herbal medications. Do not take any NSAIDs ie: Ibuprofen, Advil, Naproxen (ALeve), Motrin, BC and Goody Powder or any medication containing Aspirin; stop now.    How to Manage Your Diabetes Before and After Surgery  Why is it important to control my blood sugar before and after surgery? . Improving blood sugar levels before and after surgery helps healing and can limit problems. . A way of improving blood sugar control is eating a healthy diet by: o  Eating less sugar and carbohydrates o  Increasing activity/exercise o  Talking with your doctor about reaching your blood sugar goals . High blood sugars (greater than 180 mg/dL) can raise your risk of infections and slow your recovery, so you will need to focus on controlling your diabetes during the weeks before surgery. . Make sure that the doctor who takes care of your diabetes knows about your planned surgery including the date and location.  How do I manage my blood sugar before surgery? . Check your blood sugar at least 4 times a day, starting 2 days before surgery, to make sure that the level is not too  high or low. o Check your blood sugar the morning of your surgery when you wake up and every 2 hours until you get to the Short Stay unit. . If your blood sugar is less than 70 mg/dL, you will need to treat for low blood sugar: o Do not take insulin. o Treat a low blood sugar (less than 70 mg/dL) with  cup of clear juice (cranberry or apple), 4 glucose tablets, OR glucose gel. Recheck blood sugar in 15 minutes after treatment (to make sure it is greater than 70 mg/dL). If your blood sugar is not greater than 70 mg/dL on recheck, call [phone] o  for further instructions. . Report your blood sugar to the short stay nurse when you get to Short Stay.  . If you are admitted to the hospital after surgery: o Your blood sugar will be checked by the staff and you will probably be given insulin after surgery (instead of oral diabetes medicines) to make sure you have good blood sugar levels. o The goal for blood sugar control after surgery is 80-180 mg/dL.  Reviewed and Endorsed by [facility] Patient Education Committee, August 2015  Do not wear jewelry, make-up or nail polish.  Do not wear lotions, powders, or perfumes, or deoderant.  Do not shave 48 hours prior to surgery.    Do not bring valuables to the hospital.  [facility] is not responsible for any belongings or valuables.  Contacts, dentures or bridgework may not be worn into surgery. For patients admitted  to the hospital, discharge time will be determined by your treatment team. Patients discharged the day of surgery will not be allowed to drive home.  Special instructions: Shower the night before surgery and the morning of surgery with CHG Please read over the following fact sheets that you were given. Pain Booklet, Coughing and Deep Breathing and Surgical Site Infection Prevention

## 2017-09-19 NOTE — Progress Notes (Signed)
Pt denies SOB, chest pain, and being under the care of a cardiologist. Pt denies having a stress test, echo and cardiac cath. Pt denies having an A1c within the last 2 months; pt denies any recent labs. Rica MastAngela Kabbe, NP, Anesthesia, made aware of pt elevated BP and that pt stated that she did not take BP medications prior to PAT visit. Pt advised to take BP medications when she arrives home today and the morning of surgery ( to prevent the chance of cancellation due to dangerously high BP). Pt chart forwarded to anesthesia to review elevated blood glucose result.

## 2017-09-20 MED ORDER — DEXTROSE 5 % IV SOLN
3.0000 g | INTRAVENOUS | Status: AC
  Administered 2017-09-23: 3 g via INTRAVENOUS
  Filled 2017-09-20: qty 3
  Filled 2017-09-20: qty 3000

## 2017-09-20 NOTE — Progress Notes (Signed)
Anesthesia Chart Review: Patient is a 41 year old female scheduled for left knee arthroscopy on 09/23/17 by Dr. Jodi GeraldsJohn Graves.  History includes smoking, endometriosis, PCOS, Bipolar disorder, HTN, depression, anxiety, DM2, arthritis, T&A 05/29/17 complicated by hemorrhage s/p control of bleeding in OR 06/05/17, hysterectomy. BMI is consistent with morbid obesity (BMI > 50).   Meds include amlodipine, clonazepam, Percocet.   Pulse 100   Resp 18   Ht 5\' 6"  (1.676 m)   Wt (!) 349 lb 1.6 oz (158.4 kg)   SpO2 99%   BMI 56.35 kg/m  Although BP not recorded, PAT RN called anesthesia APP with elevated BP reading. Patient had not taken her amlodipine, so was advised to take as prescribes because an significantly elevated BP could result in surgery delay or cancellation. BP readings during 09/09/17 ED visit for knee pain were 148/74, 145/72.   EKG 06/03/17: ST at 107 bpm, LAE, consider biatrial enlargement.   CXR 06/03/17: IMPRESSION: Negative.  No acute cardiopulmonary abnormality.  Preoperative labs noted. Cr 0.75. Non-fasting glucose 232, but A1c 7.0. H/H 15.2/45.9.   Patient will get vitals on arrival. If BP acceptable and otherwise no acute changes then I anticipate that she can proceed as planned.  Velna Ochsllison Saydie Gerdts, PA-C [facility]/Anesthesiology Phone [phone] 09/20/2017 9:55 AM

## 2017-09-22 NOTE — Anesthesia Preprocedure Evaluation (Addendum)
Anesthesia Evaluation  Patient identified by MRN, date of birth, ID band Patient awake    Reviewed: Allergy & Precautions, NPO status , Patient's Chart, lab work & pertinent test results  History of Anesthesia Complications Negative for: history of anesthetic complications  Airway Mallampati: II  TM Distance: >3 FB Neck ROM: Full    Dental  (+) Teeth Intact, Dental Advisory Given, Chipped   Pulmonary Current Smoker,    Pulmonary exam normal breath sounds clear to auscultation       Cardiovascular hypertension, Pt. on medications (-) Past MI Normal cardiovascular exam Rhythm:Regular Rate:Normal     Neuro/Psych PSYCHIATRIC DISORDERS Anxiety Depression Bipolar Disorder    GI/Hepatic negative GI ROS, Neg liver ROS,   Endo/Other  diabetes, Type obesity  Renal/GU negative Renal ROS  negative genitourinary   Musculoskeletal  (+) Arthritis ,   Abdominal (+) + obese,   Peds  Hematology negative hematology ROS (+)   Anesthesia Other Findings   Reproductive/Obstetrics                            Anesthesia Physical  Anesthesia Plan  ASA: III  Anesthesia Plan: General   Post-op Pain Management:    Induction: Intravenous  PONV Risk Score and Plan: 2 and Ondansetron, Dexamethasone, Treatment may vary due to age or medical condition and Midazolam  Airway Management Planned: LMA  Additional Equipment: None  Intra-op Plan:   Post-operative Plan: Extubation in OR  Informed Consent: I have reviewed the patients History and Physical, chart, labs and discussed the procedure including the risks, benefits and alternatives for the proposed anesthesia with the patient or authorized representative who has indicated his/her understanding and acceptance.   Dental advisory given  Plan Discussed with: CRNA  Anesthesia Plan Comments: (Plan for LMA with back of table slightly elevated)         Anesthesia Quick Evaluation

## 2017-09-22 NOTE — H&P (Signed)
PREOPERATIVE H&P  Chief Complaint: left knee pain with severe pain and difficulty walking  HPI: Amy Reese is a 41 y.o. female who presents for evaluation of severe left knee pain with intermittent locking catching and difficulty walking. It has been present for for long time but recently began getting dramatically worse which has put her on crutches with inability to walk and has been worsening. She has failed conservative measures. Pain is rated as severe.  Past Medical History:  Diagnosis Date  . Anxiety   . Arthritis   . Bipolar disorder (HCC)   . Depression   . Diabetes mellitus without complication (HCC)    A1c 8.8 05/24/17  . Endometriosis   . Environmental allergies    seasonal and pollen allergies  . Hypertension   . Left knee pain   . Obesity   . Polycystic ovarian disease   . Wears glasses    Past Surgical History:  Procedure Laterality Date  . ABDOMINAL HYSTERECTOMY    . control of oropharyngeal hemorrhage N/A 06/05/2017   [facility] Newman Pieseoh[facility] Su[facility]  . DILATION AND CURETTAGE OF UTERUS    . TONSILLECTOMY AND ADENOIDECTOMY N/A 05/29/2017   Performed by Newman Pieseoh, Su, [facility]  . WISDOM TOOTH EXTRACTION     Social History   Socioeconomic History  . Marital status: Legally Separated    Spouse name: Not on file  . Number of children: Not on file  . Years of education: Not on file  . Highest education level: Not on file  Social Needs  . Financial resource strain: Not on file  . Food insecurity - worry: Not on file  . Food insecurity - inability: Not on file  . Transportation needs - medical: Not on file  . Transportation needs - non-medical: Not on file  Occupational History  . Not on file  Tobacco Use  . Smoking status: Current Every Day Smoker    Packs/day: 1.00    Types: Cigarettes  . Smokeless tobacco: Never Used  . Tobacco comment: Pt considering nicotine patch  Substance and Sexual Activity  . Alcohol use: No  . Drug use: No  . Sexual  activity: Yes    Birth control/protection: Surgical  Other Topics Concern  . Not on file  Social History Narrative  . Not on file   Family History  Problem Relation Age of Onset  . Breast cancer Mother   . COPD Mother   . Throat cancer Father    Allergies  Allergen Reactions  . Bee Venom Anaphylaxis and Swelling   Prior to Admission medications   Medication Sig Start Date End Date Taking? Authorizing Provider  amLODipine (NORVASC) 10 MG tablet Take 10 mg daily by mouth.    [provider]  clonazePAM (KLONOPIN) 1 MG tablet Take 1 mg by mouth 2 (two) times daily as needed for anxiety.    [provider]  diclofenac sodium (VOLTAREN) 1 % GEL Apply 4 g topically 4 (four) times daily. Patient not taking: Reported on 09/09/2017 08/12/17   Vickki HearingHarrison, Stanley E, MD  ibuprofen (ADVIL,MOTRIN) 800 MG tablet Take 1 tablet (800 mg total) 3 (three) times daily by mouth. 09/09/17   Triplett, Tammy, PA-C  oxyCODONE-acetaminophen (PERCOCET/ROXICET) 5-325 MG tablet Take 1 tablet every 4 (four) hours as needed by mouth. 09/09/17   Triplett, Tammy, PA-C     Positive ROS: None  All other systems have been reviewed and were otherwise negative with the exception of those  mentioned in the HPI and as above.  Physical Exam: There were no vitals filed for this visit.  General: Alert, no acute distress Cardiovascular: No pedal edema Respiratory: No cyanosis, no use of accessory musculature GI: No organomegaly, abdomen is soft and non-tender Skin: No lesions in the area of chief complaint Neurologic: Sensation intact distally Psychiatric: Patient is competent for consent with normal mood and affect Lymphatic: No axillary or cervical lymphadenopathy  MUSCULOSKELETAL: left knee trace effusion. Painful range of motion.  Severe joint line tenderness. CT scan: CT shows 2 loose bodies essentially trapped within the joint line.  Assessment/Plan: LEFT KNEE PAIN Plan for  Procedure(s): ARTHROSCOPY KNEE with removal of loose bodies  The risks benefits and alternatives were discussed with the patient including but not limited to the risks of nonoperative treatment, versus surgical intervention including infection, bleeding, nerve injury, malunion, nonunion, hardware prominence, hardware failure, need for hardware removal, blood clots, cardiopulmonary complications, morbidity, mortality, among others, and they were willing to proceed.  Predicted outcome is good, although there will be at least a six to nine month expected recovery.  Amy JuniorJohn L Bay Jarquin, MD 09/22/2017 5:24 PM

## 2017-09-23 ENCOUNTER — Ambulatory Visit (HOSPITAL_COMMUNITY): Payer: 59 | Admitting: Emergency Medicine

## 2017-09-23 ENCOUNTER — Encounter (HOSPITAL_COMMUNITY): Payer: Self-pay | Admitting: *Deleted

## 2017-09-23 ENCOUNTER — Encounter (HOSPITAL_COMMUNITY)
Admission: RE | Disposition: A | Discharge status: Home / Self Care | Payer: Self-pay | Source: Ambulatory Visit | Attending: Orthopedic Surgery

## 2017-09-23 ENCOUNTER — Ambulatory Visit (HOSPITAL_COMMUNITY): Payer: 59 | Admitting: Anesthesiology

## 2017-09-23 ENCOUNTER — Ambulatory Visit (HOSPITAL_COMMUNITY)
Admission: RE | Admit: 2017-09-23 | Discharge: 2017-09-23 | Disposition: A | Discharge status: Home / Self Care | Payer: 59 | Source: Ambulatory Visit | Attending: Orthopedic Surgery | Admitting: Orthopedic Surgery

## 2017-09-23 DIAGNOSIS — F319 Bipolar disorder, unspecified: Secondary | ICD-10-CM | POA: Diagnosis not present

## 2017-09-23 DIAGNOSIS — F17210 Nicotine dependence, cigarettes, uncomplicated: Secondary | ICD-10-CM | POA: Insufficient documentation

## 2017-09-23 DIAGNOSIS — Z6841 Body Mass Index (BMI) 40.0 and over, adult: Secondary | ICD-10-CM | POA: Insufficient documentation

## 2017-09-23 DIAGNOSIS — M1990 Unspecified osteoarthritis, unspecified site: Secondary | ICD-10-CM | POA: Diagnosis not present

## 2017-09-23 DIAGNOSIS — M2342 Loose body in knee, left knee: Secondary | ICD-10-CM | POA: Insufficient documentation

## 2017-09-23 DIAGNOSIS — E119 Type 2 diabetes mellitus without complications: Secondary | ICD-10-CM | POA: Insufficient documentation

## 2017-09-23 DIAGNOSIS — F419 Anxiety disorder, unspecified: Secondary | ICD-10-CM | POA: Diagnosis not present

## 2017-09-23 DIAGNOSIS — M2242 Chondromalacia patellae, left knee: Secondary | ICD-10-CM | POA: Diagnosis not present

## 2017-09-23 DIAGNOSIS — I10 Essential (primary) hypertension: Secondary | ICD-10-CM | POA: Diagnosis not present

## 2017-09-23 DIAGNOSIS — Z91030 Bee allergy status: Secondary | ICD-10-CM | POA: Diagnosis not present

## 2017-09-23 DIAGNOSIS — Z79899 Other long term (current) drug therapy: Secondary | ICD-10-CM | POA: Insufficient documentation

## 2017-09-23 DIAGNOSIS — M1712 Unilateral primary osteoarthritis, left knee: Secondary | ICD-10-CM | POA: Diagnosis present

## 2017-09-23 HISTORY — PX: KNEE ARTHROSCOPY: SHX127

## 2017-09-23 LAB — GLUCOSE, CAPILLARY
GLUCOSE-CAPILLARY: 142 mg/dL — AB (ref 65–99)
Glucose-Capillary: 129 mg/dL — ABNORMAL HIGH (ref 65–99)

## 2017-09-23 SURGERY — ARTHROSCOPY, KNEE
Anesthesia: General | Laterality: Left

## 2017-09-23 MED ORDER — PROPOFOL 10 MG/ML IV BOLUS
INTRAVENOUS | Status: DC | PRN
  Administered 2017-09-23: 300 mg via INTRAVENOUS

## 2017-09-23 MED ORDER — PROMETHAZINE HCL 25 MG/ML IJ SOLN: 6.2500 mg | INTRAMUSCULAR | Status: DC | PRN

## 2017-09-23 MED ORDER — FENTANYL CITRATE (PF) 100 MCG/2ML IJ SOLN
INTRAMUSCULAR | Status: AC
  Administered 2017-09-23: 50 ug via INTRAVENOUS
  Filled 2017-09-23: qty 2

## 2017-09-23 MED ORDER — FENTANYL CITRATE (PF) 250 MCG/5ML IJ SOLN
INTRAMUSCULAR | Status: AC
Start: 2017-09-23 — End: ?
  Filled 2017-09-23: qty 5

## 2017-09-23 MED ORDER — MIDAZOLAM HCL 2 MG/2ML IJ SOLN
INTRAMUSCULAR | Status: AC
  Filled 2017-09-23: qty 2

## 2017-09-23 MED ORDER — BUPIVACAINE HCL (PF) 0.25 % IJ SOLN
INTRAMUSCULAR | Status: DC | PRN
  Administered 2017-09-23: 20 mL

## 2017-09-23 MED ORDER — METFORMIN HCL 500 MG PO TABS: 500.0000 mg | ORAL_TABLET | Freq: Every day | ORAL | 0 refills | Status: DC

## 2017-09-23 MED ORDER — PROPOFOL 10 MG/ML IV BOLUS
INTRAVENOUS | Status: AC
  Filled 2017-09-23: qty 20

## 2017-09-23 MED ORDER — CEFAZOLIN SODIUM-DEXTROSE 2-4 GM/100ML-% IV SOLN
2.0000 g | Freq: Once | INTRAVENOUS | Status: AC
  Administered 2017-09-23: 2 g via INTRAVENOUS

## 2017-09-23 MED ORDER — CEPHALEXIN 500 MG PO CAPS: 500.0000 mg | ORAL_CAPSULE | Freq: Two times a day (BID) | ORAL | 0 refills | Status: DC

## 2017-09-23 MED ORDER — DEXAMETHASONE SODIUM PHOSPHATE 4 MG/ML IJ SOLN
INTRAMUSCULAR | Status: DC | PRN
  Administered 2017-09-23: 8 mg via INTRAVENOUS

## 2017-09-23 MED ORDER — MIDAZOLAM HCL 5 MG/5ML IJ SOLN
INTRAMUSCULAR | Status: DC | PRN
  Administered 2017-09-23: 2 mg via INTRAVENOUS

## 2017-09-23 MED ORDER — KETOROLAC TROMETHAMINE 30 MG/ML IJ SOLN
INTRAMUSCULAR | Status: DC | PRN
  Administered 2017-09-23: 30 mg via INTRAVENOUS

## 2017-09-23 MED ORDER — LACTATED RINGERS IV SOLN
INTRAVENOUS | Status: DC | PRN
  Administered 2017-09-23: 07:00:00 via INTRAVENOUS

## 2017-09-23 MED ORDER — SODIUM CHLORIDE 0.9 % IR SOLN
Status: DC | PRN
  Administered 2017-09-23: 3000 mL

## 2017-09-23 MED ORDER — FENTANYL CITRATE (PF) 100 MCG/2ML IJ SOLN
25.0000 ug | INTRAMUSCULAR | Status: DC | PRN
  Administered 2017-09-23 (×3): 50 ug via INTRAVENOUS

## 2017-09-23 MED ORDER — BUPIVACAINE HCL (PF) 0.25 % IJ SOLN
INTRAMUSCULAR | Status: AC
  Filled 2017-09-23: qty 30

## 2017-09-23 MED ORDER — CHLORHEXIDINE GLUCONATE 4 % EX LIQD: 60.0000 mL | Freq: Once | CUTANEOUS | Status: DC

## 2017-09-23 MED ORDER — LIDOCAINE HCL (CARDIAC) 20 MG/ML IV SOLN
INTRAVENOUS | Status: DC | PRN
  Administered 2017-09-23: 80 mg via INTRAVENOUS

## 2017-09-23 MED ORDER — OXYCODONE-ACETAMINOPHEN 5-325 MG PO TABS
ORAL_TABLET | ORAL | Status: AC
  Filled 2017-09-23: qty 2

## 2017-09-23 MED ORDER — CEFAZOLIN SODIUM-DEXTROSE 2-4 GM/100ML-% IV SOLN
INTRAVENOUS | Status: AC
  Administered 2017-09-23: 2 g via INTRAVENOUS
  Filled 2017-09-23: qty 100

## 2017-09-23 MED ORDER — ONDANSETRON HCL 4 MG/2ML IJ SOLN
INTRAMUSCULAR | Status: DC | PRN
  Administered 2017-09-23: 4 mg via INTRAVENOUS

## 2017-09-23 MED ORDER — OXYCODONE-ACETAMINOPHEN 5-325 MG PO TABS
1.0000 | ORAL_TABLET | Freq: Once | ORAL | Status: AC
  Administered 2017-09-23: 2 via ORAL

## 2017-09-23 MED ORDER — OXYCODONE-ACETAMINOPHEN 5-325 MG PO TABS: 1.0000 | ORAL_TABLET | Freq: Four times a day (QID) | ORAL | 0 refills | Status: DC | PRN

## 2017-09-23 MED ORDER — FENTANYL CITRATE (PF) 100 MCG/2ML IJ SOLN
INTRAMUSCULAR | Status: DC | PRN
  Administered 2017-09-23 (×4): 50 ug via INTRAVENOUS

## 2017-09-23 SURGICAL SUPPLY — 35 items
BANDAGE ACE 6X5 VEL STRL LF (GAUZE/BANDAGES/DRESSINGS) ×2 IMPLANT
BLADE CUDA 5.5 (BLADE) IMPLANT
BLADE CUTTER GATOR 3.5 (BLADE) ×2 IMPLANT
BLADE GREAT WHITE 4.2 (BLADE) ×2 IMPLANT
BNDG GAUZE ELAST 4 BULKY (GAUZE/BANDAGES/DRESSINGS) ×2 IMPLANT
CUFF TOURNIQUET SINGLE 34IN LL (TOURNIQUET CUFF) IMPLANT
CUFF TOURNIQUET SINGLE 44IN (TOURNIQUET CUFF) IMPLANT
DRAPE ARTHROSCOPY W/POUCH 114 (DRAPES) ×2 IMPLANT
DRAPE HALF SHEET 40X57 (DRAPES) IMPLANT
DRAPE U-SHAPE 47X51 STRL (DRAPES) IMPLANT
DRSG EMULSION OIL 3X3 NADH (GAUZE/BANDAGES/DRESSINGS) ×2 IMPLANT
DRSG PAD ABDOMINAL 8X10 ST (GAUZE/BANDAGES/DRESSINGS) ×2 IMPLANT
DURAPREP 26ML APPLICATOR (WOUND CARE) ×4 IMPLANT
FILTER STRAW FLUID ASPIR (MISCELLANEOUS) ×2 IMPLANT
GAUZE SPONGE 4X4 12PLY STRL (GAUZE/BANDAGES/DRESSINGS) ×2 IMPLANT
GAUZE SPONGE 4X4 12PLY STRL LF (GAUZE/BANDAGES/DRESSINGS) ×2 IMPLANT
GLOVE BIOGEL PI IND STRL 8 (GLOVE) ×2 IMPLANT
GLOVE BIOGEL PI INDICATOR 8 (GLOVE) ×2
GLOVE ECLIPSE 7.5 STRL STRAW (GLOVE) ×4 IMPLANT
GOWN STRL REUS W/ TWL LRG LVL3 (GOWN DISPOSABLE) ×1 IMPLANT
GOWN STRL REUS W/ TWL XL LVL3 (GOWN DISPOSABLE) ×2 IMPLANT
GOWN STRL REUS W/TWL LRG LVL3 (GOWN DISPOSABLE) ×2
GOWN STRL REUS W/TWL XL LVL3 (GOWN DISPOSABLE) ×4
KIT BASIN OR (CUSTOM PROCEDURE TRAY) ×2 IMPLANT
KIT ROOM TURNOVER OR (KITS) ×2 IMPLANT
NEEDLE 18GX1X1/2 (RX/OR ONLY) (NEEDLE) ×2 IMPLANT
PACK ARTHROSCOPY DSU (CUSTOM PROCEDURE TRAY) ×2 IMPLANT
PAD ARMBOARD 7.5X6 YLW CONV (MISCELLANEOUS) ×4 IMPLANT
PAD CAST 4YDX4 CTTN HI CHSV (CAST SUPPLIES) ×2 IMPLANT
PADDING CAST COTTON 4X4 STRL (CAST SUPPLIES) ×4
SET ARTHROSCOPY TUBING (MISCELLANEOUS) ×2
SET ARTHROSCOPY TUBING LN (MISCELLANEOUS) ×1 IMPLANT
SUT ETHILON 4 0 PS 2 18 (SUTURE) ×2 IMPLANT
SYR 5ML LL (SYRINGE) ×2 IMPLANT
WRAP KNEE MAXI GEL POST OP (GAUZE/BANDAGES/DRESSINGS) ×2 IMPLANT

## 2017-09-23 NOTE — Transfer of Care (Signed)
Immediate Anesthesia Transfer of Care Note  Patient: Amy Reese  Procedure(s) Performed: ARTHROSCOPY KNEE (Left )  Patient Location: PACU  Anesthesia Type:General  Level of Consciousness: awake, alert , oriented and patient cooperative  Airway & Oxygen Therapy: Patient Spontanous Breathing and Patient connected to nasal cannula oxygen  Post-op Assessment: Report given to RN and Post -op Vital signs reviewed and stable  Post vital signs: Reviewed and stable  Last Vitals:  Vitals:   09/23/17 0600 09/23/17 0840  BP: (!) 148/93   Pulse: 92   Resp: 18   Temp: 36.7 C (!) 36.2 C  SpO2: 100%     Last Pain:  Vitals:   09/23/17 0555  PainSc: 5       Patients Stated Pain Goal: 0 (09/23/17 0555)  Complications: No apparent anesthesia complications

## 2017-09-23 NOTE — Anesthesia Postprocedure Evaluation (Signed)
Anesthesia Post Note  Patient: Amy Reese  Procedure(s) Performed: ARTHROSCOPY KNEE (Left )     Patient location during evaluation: PACU Anesthesia Type: General Level of consciousness: awake and alert Pain management: pain level controlled Vital Signs Assessment: post-procedure vital signs reviewed and stable Respiratory status: spontaneous breathing, nonlabored ventilation and respiratory function stable Cardiovascular status: blood pressure returned to baseline and stable Postop Assessment: no apparent nausea or vomiting Anesthetic complications: no    Last Vitals:  Vitals:   09/23/17 0853 09/23/17 0910  BP: 107/72 120/72  Pulse: 80 81  Resp: 15 14  Temp:    SpO2: 100% 99%     LLE Motor Response: (P) Purposeful movement;Responds to commands(wiggles toes) (09/23/17 0910) LLE Sensation: (P) Full sensation (09/23/17 0910)          Beryle Lathehomas E Shonteria Abeln

## 2017-09-23 NOTE — Discharge Planning (Signed)
Oletta Cohnamellia Tomica Arseneault, RN, BSN, [place] [phone] Pt qualifies for DME wheelchair.  DME  ordered through [facility].  [facility] pt prior to D/C home. [facility] [address] related to size not being stocked on campus.

## 2017-09-23 NOTE — Discharge Instructions (Signed)
POST-OP KNEE ARTHROSCOPY INSTRUCTIONS  °Dr. John Graves/Jim Merinda Victorino PA-C ° °Pain °You will be expected to have a moderate amount of pain in the affected knee for approximately two weeks. However, the first two days will be the most severe pain. A prescription has been provided to take as needed for the pain. The pain can be reduced by applying ice packs to the knee for the first 1-2 weeks post surgery. Also, keeping the leg elevated on pillows will help alleviate the pain. If you develop any acute pain or swelling in your calf muscle, please call the doctor. ° °Activity °It is preferred that you stay at bed rest for approximately 24 hours. However, you may go to the bathroom with help. Weight bearing as tolerated. You may begin the knee exercises the day of surgery. Discontinue crutches as the knee pain resolves. ° °Dressing °Keep the dressing dry. If the ace bandage should wrinkle or roll up, this can be rewrapped to prevent ridges in the bandage. You may remove all dressings in 48 hours,  apply bandaids to each wound. You may shower on the 4th day after surgery but no tub bath. ° °Symptoms to report to your doctor °Extreme pain °Extreme swelling °Temperature above 101 degrees °Change in the feeling, color, or movement of your toes °Redness, heat, or swelling at your incision ° °Exercise °If is preferred that as soon as possible you try to do a straight leg raise without bending the knee and concentrate on bringing the heel of your foot off the bed up to approximately 45 degrees and hold for the count of 10 seconds. Repeat this at least 10 times three or four times per day. Additional exercises are provided below. ° °You are encouraged to bend the knee as tolerated. ° °Follow-Up °Call to schedule a follow-up appointment in 5-7 days. Our office # is [phone]. ° °POST-OP EXERCISES ° °Short Arc Quads ° °1. Lie on back with legs straight. Place towel roll under thigh, just above knee. °2. Tighten thigh muscles to  straighten knee and lift heel off bed. °3. Hold for slow count of five, then lower. °4. Do three sets of ten ° ° ° °Straight Leg Raises ° °1. Lie on back with operative leg straight and other leg bent. °2. Keeping operative leg completely straight, slowly lift operative leg so foot is 5 inches off bed. °3. Hold for slow count of five, then lower. °4. Do three sets of ten. ° ° ° °DO BOTH EXERCISES 2 TIMES A DAY ° °Ankle Pumps ° °Work/move the operative ankle and foot up and down 10 times every hour while awake. °

## 2017-09-23 NOTE — Anesthesia Procedure Notes (Signed)
Procedure Name: LMA Insertion Date/Time: 09/23/2017 7:41 AM Performed by: Tillman AbideHawkins, Jonique Kulig B, CRNA Pre-anesthesia Checklist: Patient identified, Emergency Drugs available, Suction available and Patient being monitored Patient Re-evaluated:Patient Re-evaluated prior to induction Oxygen Delivery Method: Circle System Utilized Preoxygenation: Pre-oxygenation with 100% oxygen Induction Type: IV induction Ventilation: Mask ventilation without difficulty LMA: LMA inserted LMA Size: 4.0 Number of attempts: 1 Placement Confirmation: positive ETCO2 Tube secured with: Tape Dental Injury: Teeth and Oropharynx as per pre-operative assessment

## 2017-09-23 NOTE — Addendum Note (Signed)
Addendum  created 09/23/17 1003 by Tillman AbideHawkins, Mercedies Ganesh B, CRNA   Intraprocedure Flowsheets edited

## 2017-09-23 NOTE — Progress Notes (Signed)
[facility] [place] to sign and order for DME wheelchair.

## 2017-09-24 ENCOUNTER — Encounter (HOSPITAL_COMMUNITY): Payer: Self-pay | Admitting: Orthopedic Surgery

## 2017-09-24 NOTE — Op Note (Signed)
NAME:  Amy Reese, Amy Reese                    ACCOUNT NO.:  MEDICAL RECORD NO.:  [id number]  LOCATION:                                 FACILITY:  PHYSICIAN:  Harvie JuniorJohn L. Kino Dunsworth, M.D.   DATE OF BIRTH:  August 03, 1976  DATE OF PROCEDURE:  09/23/2017 DATE OF DISCHARGE:                              OPERATIVE REPORT   PREOPERATIVE DIAGNOSIS:  Retained loose bodies of left knee with chondromalacia.  POSTOPERATIVE DIAGNOSIS:  Retained loose bodies of left knee with chondromalacia.  PROCEDURES: 1. Left knee arthroscopy with debridement of chondromalacia lateral and     patellofemoral. 2. Removal of multiple osteocartilaginous loose bodies. 3. Medial plica debridement.  SURGEON:  Harvie JuniorJohn L. Ericia Moxley, MD.  ASSISTANT:  Marshia LyJames Bethune, PA.  ANESTHESIA:  General.  BRIEF HISTORY:  Ms. Amy Reese is a 41 year old female, morbidly obese, who presented to my office with left knee pain.  She had left knee pain for many years, but became acutely unable to stand and walk.  She had a CT done by an outside physician, which showed 2 small loose bodies, but unfortunately of a size that could get in the joint line and cause significant problems.  We had talked about treatment options.  We felt that it was reasonable even with her morbid obesity to scope the knee and see if we could now make her better by removing these loose bodies. She was brought to the operating room for this procedure.  DESCRIPTION OF PROCEDURE:  The patient was brought to the operating room.  After adequate anesthesia was obtained with general anesthetic, the patient was placed supine on the operating table.  The left leg was prepped and draped in usual sterile fashion.  Following this, routine arthroscopic examination of the knee revealed there was significant chondromalacia in the medial compartment as well as severe chondromalacia inthe patellofemoral compartment.  As we moved towards the lateral compartment, a large osteocartilaginous loose  body came into the joint and this was removed with a grasper.  Attention was turned laterally with significant chondromalacia on the lateral femoral condyle, which was debrided and this smoothed this area down.  There unfortunately was also significant chondromalacia on the lateral tibial plateau, which was debrided.  At this point, we went back in the medial and lateral gutters looking for additional cartilaginous loose bodies.  We went back into the popliteal recess from the lateral portal and did not find additional loose bodies. We went back up into the pouch and did a debridement with vacuum assist throughout the pouch, medial gutter, lateral gutter, popliteal hiatus and then back in the medial and lateral compartments.  Once we were satisfied there were no additional loose bodies in the knee and a reasonable chondroplasty had been performed, the knee was irrigated, suctioned dry and then closed with a compressive dressing after 20 mL of one 0.25% Marcaine had been instilled in the knee for postoperative anesthesia. Sterile compressive dressing was applied at this point, and the patient was taken to Recovery where she was noted to be in satisfactory condition.  Estimated blood loss for the procedure was minimal.     Harvie JuniorJohn L. Acie Custis, M.D.  JLG/MEDQ  D:  09/23/2017  T:  09/23/2017  Job:  [id number]
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
## 2017-11-28 ENCOUNTER — Encounter (HOSPITAL_COMMUNITY): Payer: Self-pay | Admitting: Psychiatry

## 2017-11-28 ENCOUNTER — Ambulatory Visit (INDEPENDENT_AMBULATORY_CARE_PROVIDER_SITE_OTHER): Payer: 59 | Admitting: Psychiatry

## 2017-11-28 VITALS — BP 123/85 | HR 95 | Ht 66.0 in | Wt 356.0 lb

## 2017-11-28 DIAGNOSIS — F410 Panic disorder [episodic paroxysmal anxiety] without agoraphobia: Secondary | ICD-10-CM | POA: Diagnosis not present

## 2017-11-28 DIAGNOSIS — F17210 Nicotine dependence, cigarettes, uncomplicated: Secondary | ICD-10-CM

## 2017-11-28 DIAGNOSIS — R450 Nervousness: Secondary | ICD-10-CM | POA: Diagnosis not present

## 2017-11-28 DIAGNOSIS — G4700 Insomnia, unspecified: Secondary | ICD-10-CM | POA: Diagnosis not present

## 2017-11-28 DIAGNOSIS — R45851 Suicidal ideations: Secondary | ICD-10-CM | POA: Diagnosis not present

## 2017-11-28 DIAGNOSIS — F0630 Mood disorder due to known physiological condition, unspecified: Secondary | ICD-10-CM

## 2017-11-28 DIAGNOSIS — F419 Anxiety disorder, unspecified: Secondary | ICD-10-CM | POA: Diagnosis not present

## 2017-11-28 DIAGNOSIS — R45850 Homicidal ideations: Secondary | ICD-10-CM | POA: Diagnosis not present

## 2017-11-28 DIAGNOSIS — Z62810 Personal history of physical and sexual abuse in childhood: Secondary | ICD-10-CM | POA: Diagnosis not present

## 2017-11-28 MED ORDER — CLONAZEPAM 0.5 MG PO TABS: 0.5000 mg | ORAL_TABLET | Freq: Two times a day (BID) | ORAL | 0 refills | Status: DC | PRN

## 2017-11-28 MED ORDER — LURASIDONE HCL 20 MG PO TABS: 20.0000 mg | ORAL_TABLET | Freq: Every day | ORAL | 0 refills | Status: DC

## 2017-11-28 NOTE — Patient Instructions (Addendum)
1. Start latuda 20 mg daily  2. Start clonazepam 0.5 mg twice a day as needed for anxiety  3. Return to clinic in a few weeks for 30 mins 4. CONTACT INFORMATION  What to do if you need to get in touch with someone regarding a psychiatric issue:  1. EMERGENCY: For psychiatric emergencies (if you are suicidal or if there are any other safety issues) call 911 and/or go to your nearest Emergency Room immediately.   2. IF YOU NEED SOMEONE TO TALK TO RIGHT NOW: Given my clinical responsibilities, I may not be able to speak with you over the phone for a prolonged period of time.  a. You may always call The National Suicide Prevention Lifeline at 1-800-273-TALK 442-747-7360(8255).  b. Your county of residence will also have local crisis services. [place]: [facility] at [phone]

## 2017-12-10 ENCOUNTER — Ambulatory Visit (HOSPITAL_COMMUNITY): Payer: Self-pay | Admitting: Licensed Clinical Social Worker
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
## 2017-12-16 ENCOUNTER — Encounter (HOSPITAL_COMMUNITY): Payer: Self-pay | Admitting: Psychiatry

## 2017-12-16 ENCOUNTER — Telehealth (HOSPITAL_COMMUNITY): Payer: Self-pay | Admitting: Psychiatry

## 2017-12-16 ENCOUNTER — Ambulatory Visit (INDEPENDENT_AMBULATORY_CARE_PROVIDER_SITE_OTHER): Payer: 59 | Admitting: Psychiatry

## 2017-12-16 VITALS — BP 137/87 | HR 97 | Ht 66.0 in | Wt 350.0 lb

## 2017-12-16 DIAGNOSIS — F419 Anxiety disorder, unspecified: Secondary | ICD-10-CM

## 2017-12-16 DIAGNOSIS — F17210 Nicotine dependence, cigarettes, uncomplicated: Secondary | ICD-10-CM | POA: Diagnosis not present

## 2017-12-16 DIAGNOSIS — Z560 Unemployment, unspecified: Secondary | ICD-10-CM | POA: Diagnosis not present

## 2017-12-16 DIAGNOSIS — F0630 Mood disorder due to known physiological condition, unspecified: Secondary | ICD-10-CM

## 2017-12-16 DIAGNOSIS — R450 Nervousness: Secondary | ICD-10-CM

## 2017-12-16 DIAGNOSIS — Z638 Other specified problems related to primary support group: Secondary | ICD-10-CM | POA: Diagnosis not present

## 2017-12-16 DIAGNOSIS — F4310 Post-traumatic stress disorder, unspecified: Secondary | ICD-10-CM | POA: Diagnosis not present

## 2017-12-16 DIAGNOSIS — R45851 Suicidal ideations: Secondary | ICD-10-CM | POA: Diagnosis not present

## 2017-12-16 MED ORDER — CLONAZEPAM 0.5 MG PO TABS: 0.5000 mg | ORAL_TABLET | Freq: Two times a day (BID) | ORAL | 0 refills | Status: DC | PRN

## 2017-12-16 MED ORDER — LURASIDONE HCL 40 MG PO TABS: 40.0000 mg | ORAL_TABLET | Freq: Every day | ORAL | 0 refills | Status: DC

## 2017-12-16 NOTE — Patient Instructions (Signed)
1. Increase latuda 40 mg daily 2. Continue clonazepam 0.5 mg twice a day as needed for anxiety (refill after 2/20) 3. Return to clinic in a few weeks for 30 mins 4. Emergency resources which includes 911, ED, suicide crisis line [phone]) are discussed.  5. Referral to therapy 6. Referral to neuropsychology testing 7. Obtain record from her psychiatrist

## 2017-12-16 NOTE — Telephone Encounter (Signed)
Reveiwed a note from [facility]. [facility]ay Shugart, [address]. Last seen on 03/2016. Dx includes BPD, ADHD. No detailed assessment for ADHD nor neuropsych evaluation is included.

## 2017-12-26 ENCOUNTER — Encounter: Payer: 59 | Attending: Psychology | Admitting: Psychology

## 2017-12-26 DIAGNOSIS — F319 Bipolar disorder, unspecified: Secondary | ICD-10-CM | POA: Insufficient documentation

## 2017-12-26 DIAGNOSIS — Z825 Family history of asthma and other chronic lower respiratory diseases: Secondary | ICD-10-CM | POA: Insufficient documentation

## 2017-12-26 DIAGNOSIS — F419 Anxiety disorder, unspecified: Secondary | ICD-10-CM | POA: Insufficient documentation

## 2017-12-26 DIAGNOSIS — E669 Obesity, unspecified: Secondary | ICD-10-CM | POA: Insufficient documentation

## 2017-12-26 DIAGNOSIS — E119 Type 2 diabetes mellitus without complications: Secondary | ICD-10-CM | POA: Insufficient documentation

## 2017-12-26 DIAGNOSIS — F0630 Mood disorder due to known physiological condition, unspecified: Secondary | ICD-10-CM

## 2017-12-26 DIAGNOSIS — I10 Essential (primary) hypertension: Secondary | ICD-10-CM | POA: Diagnosis not present

## 2017-12-26 DIAGNOSIS — Z803 Family history of malignant neoplasm of breast: Secondary | ICD-10-CM | POA: Insufficient documentation

## 2017-12-26 DIAGNOSIS — Z808 Family history of malignant neoplasm of other organs or systems: Secondary | ICD-10-CM | POA: Insufficient documentation

## 2017-12-26 DIAGNOSIS — F909 Attention-deficit hyperactivity disorder, unspecified type: Secondary | ICD-10-CM | POA: Insufficient documentation

## 2017-12-26 DIAGNOSIS — Z79899 Other long term (current) drug therapy: Secondary | ICD-10-CM | POA: Insufficient documentation

## 2017-12-26 DIAGNOSIS — F4310 Post-traumatic stress disorder, unspecified: Secondary | ICD-10-CM | POA: Diagnosis not present

## 2017-12-26 DIAGNOSIS — E282 Polycystic ovarian syndrome: Secondary | ICD-10-CM | POA: Insufficient documentation

## 2018-01-05 ENCOUNTER — Encounter: Payer: Self-pay | Admitting: Psychology

## 2018-01-05 NOTE — Progress Notes (Addendum)
Neuropsychological Consultation   Patient:   Amy Reese   DOB:   1976/08/21  MR Number:  [id number]  Location:  [facility] PHYSICAL MEDICINE AND REHABILITATION [address] [place] [id number] [place] [address] Dept: [phone]           Date of Service:   12/27/2017  Start Time:   2 PM End Time:   3 PM  Provider/Observer:  Arley Phenix, Psy.D.       Clinical Neuropsychologist       Billing Code/Service: Neurobehavioral status exam  Chief Complaint:    The patient was referred by Dr. Vanetta Shawl for neuropsychological consultation to facilitate with differential diagnostic considerations.  The patient was diagnosed with bipolar disorder 8 years ago but was also diagnosed with ADHD 3 years ago.  The patient has seen psychiatrist in the past.  She had seen a psychiatrist for a long time that treated her with Vyvanse but the psychiatrist retired.  She started to see another psychiatrist who wanted to start her on lithium stop the Vyvanse.  She got frustrated and just stopped taking all medications then went into a manic phase and became very irritable.  She has now seen Dr. Vanetta Shawl who started her on Latuda and she is doing very well from a mood standpoint.  However, continues to have issues with attention and concentration issues and does have a past history of significant PTSD type symptoms.  Reason for Service:  Amy Reese psychological evaluation to facilitate with differential diagnostic questions question is bipolar disorder versus ADD versus PTSD.  Current Status:  The patient reports that she is doing very well from a mood standpoint after starting Latuda but there are lingering questions about the and whether stimulant would be appropriate addition to her medication regimen.  Reliability of Information: The information is derived from a 1 hour face-to-face clinical interview as well as review of available  medical records.  Behavioral Observation: Amy Reese  presents as a 42 y.o.-year-old Right Caucasian Female who appeared her stated age. her dress was Appropriate and she was Well Groomed and her manners were Appropriate to the situation.  her participation was indicative of Appropriate and Attentive behaviors.  There were not any physical disabilities noted.  she displayed an appropriate level of cooperation and motivation.     Interactions:    Active Appropriate  Attention:   abnormal and attention span appeared shorter than expected for age  Memory:   within normal limits; recent and remote memory intact  Visuo-spatial:  within normal limits  Speech (Volume):  normal  Speech:   normal; normal  Thought Process:  Coherent and Relevant  Though Content:  WNL; not suicidal and not homicidal  Orientation:   person, place, time/date and situation  Judgment:   Good  Planning:   Good  Affect:    Appropriate  Mood:    Euthymic  Insight:   Good  Intelligence:   normal  Marital Status/Living: The patient was born in [place] and has 1 sibling.  Substance Use:  No concerns of substance abuse are reported.    Education:   The patient completed high school and has taken some college courses.  Medical History:   Past Medical History:  Diagnosis Date  . Anxiety   . Arthritis   . Bipolar disorder (HCC)   . Depression   . Diabetes mellitus without complication (HCC)    A1c 8.8 05/24/17  .  Endometriosis   . Environmental allergies    seasonal and pollen allergies  . Hypertension   . Left knee pain   . Obesity   . Polycystic ovarian disease   . Wears glasses        Abuse/Trauma History: The patient does have a history of traumatic experiences and has had consideration for PTSD diagnoses.  Psychiatric History:  The patient has been treated by number of psychiatrist in the past.  She has been diagnosed with bipolar disorder about a years ago and was diagnosed with  ADHD 3 years ago.  Family Med/Psych History:  Family History  Problem Relation Age of Onset  . Breast cancer Mother   . COPD Mother   . Throat cancer Father     Risk of Suicide/Violence: virtually non-existent the patient denies any suicidal or homicidal ideation.  Impression/DX:  The patient was referred by Dr. Vanetta ShawlHisada for neuropsychological consultation to facilitate with differential diagnostic considerations.  The patient was diagnosed with bipolar disorder 8 years ago but was also diagnosed with ADHD 3 years ago.  The patient has seen a psychiatrist in the past.  She had seen a psychiatrist for a long time that treated her with Vyvanse but the psychiatrist retired.  She started to see another psychiatrist who wanted to start her on lithium stop the Vyvanse.  She got frustrated and just stopped taking all medications then went into a manic phase and became very irritable.  She has now seen Dr. Vanetta ShawlHisada, who started her on Latuda and she is doing very well from a mood standpoint.  However, continues to have issues with attention and concentration issues and does have a past history of significant PTSD type symptoms.  The patient reports that she is doing very well from a mood standpoint after starting Latuda but there are lingering questions about the and whether stimulant would be appropriate addition to her medication regimen.  Disposition/Plan:  We have set the patient up for formal neuropsychological testing utilizing the comprehensive attention battery as well as an extended CPT measure.  These efforts are to objectively assess her attention and concentration and assess whether she appropriate candidate for psychostimulant medications in conjunction with her Amy KnudsenLatuda which is being used to manage her mood disturbance past history of manic episodes.  Diagnosis:    Mood disorder in conditions classified elsewhere  Posttraumatic stress disorder         Electronically  Signed   _______________________ Arley PhenixJohn Tyreke Kaeser, Psy.D.

## 2018-01-06 ENCOUNTER — Ambulatory Visit (HOSPITAL_COMMUNITY): Payer: Self-pay | Admitting: Licensed Clinical Social Worker
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
## 2018-01-13 ENCOUNTER — Ambulatory Visit (INDEPENDENT_AMBULATORY_CARE_PROVIDER_SITE_OTHER): Payer: 59 | Admitting: Psychiatry

## 2018-01-13 ENCOUNTER — Encounter (HOSPITAL_COMMUNITY): Payer: Self-pay | Admitting: Psychiatry

## 2018-01-13 VITALS — BP 138/77 | HR 96 | Ht 66.0 in | Wt 348.0 lb

## 2018-01-13 DIAGNOSIS — G4700 Insomnia, unspecified: Secondary | ICD-10-CM

## 2018-01-13 DIAGNOSIS — F0630 Mood disorder due to known physiological condition, unspecified: Secondary | ICD-10-CM | POA: Diagnosis not present

## 2018-01-13 DIAGNOSIS — F4310 Post-traumatic stress disorder, unspecified: Secondary | ICD-10-CM

## 2018-01-13 DIAGNOSIS — Z6379 Other stressful life events affecting family and household: Secondary | ICD-10-CM | POA: Diagnosis not present

## 2018-01-13 DIAGNOSIS — F419 Anxiety disorder, unspecified: Secondary | ICD-10-CM | POA: Diagnosis not present

## 2018-01-13 DIAGNOSIS — R450 Nervousness: Secondary | ICD-10-CM

## 2018-01-13 DIAGNOSIS — F17210 Nicotine dependence, cigarettes, uncomplicated: Secondary | ICD-10-CM | POA: Diagnosis not present

## 2018-01-13 MED ORDER — CLONAZEPAM 0.5 MG PO TABS: 0.5000 mg | ORAL_TABLET | Freq: Two times a day (BID) | ORAL | 1 refills | Status: DC | PRN

## 2018-01-13 NOTE — Patient Instructions (Addendum)
1. Continue Latuda 40 mg daily  2. Continue clonazepam 0.5 mg twice a day as needed for anxiety  3. Return to clinic in May for 30 mins

## 2018-01-21 ENCOUNTER — Encounter (HOSPITAL_COMMUNITY): Payer: Self-pay | Admitting: Emergency Medicine

## 2018-01-21 ENCOUNTER — Other Ambulatory Visit: Payer: Self-pay

## 2018-01-21 ENCOUNTER — Emergency Department (HOSPITAL_COMMUNITY)
Admission: EM | Admit: 2018-01-21 | Discharge: 2018-01-21 | Disposition: A | Discharge status: Home / Self Care | Payer: 59 | Attending: Emergency Medicine | Admitting: Emergency Medicine

## 2018-01-21 DIAGNOSIS — Z5321 Procedure and treatment not carried out due to patient leaving prior to being seen by health care provider: Secondary | ICD-10-CM | POA: Insufficient documentation

## 2018-01-21 DIAGNOSIS — K625 Hemorrhage of anus and rectum: Secondary | ICD-10-CM | POA: Diagnosis not present

## 2018-01-21 LAB — CBC
HEMATOCRIT: 46.3 % — AB (ref 36.0–46.0)
HEMOGLOBIN: 15.2 g/dL — AB (ref 12.0–15.0)
MCH: 30.6 pg (ref 26.0–34.0)
MCHC: 32.8 g/dL (ref 30.0–36.0)
MCV: 93.3 fL (ref 78.0–100.0)
Platelets: 245 10*3/uL (ref 150–400)
RBC: 4.96 MIL/uL (ref 3.87–5.11)
RDW: 13.1 % (ref 11.5–15.5)
WBC: 9.4 10*3/uL (ref 4.0–10.5)

## 2018-01-21 LAB — COMPREHENSIVE METABOLIC PANEL
ALT: 17 U/L (ref 14–54)
ANION GAP: 11 (ref 5–15)
AST: 17 U/L (ref 15–41)
Albumin: 3.9 g/dL (ref 3.5–5.0)
Alkaline Phosphatase: 113 U/L (ref 38–126)
BILIRUBIN TOTAL: 0.5 mg/dL (ref 0.3–1.2)
BUN: 13 mg/dL (ref 6–20)
CO2: 25 mmol/L (ref 22–32)
Calcium: 9.3 mg/dL (ref 8.9–10.3)
Chloride: 101 mmol/L (ref 101–111)
Creatinine, Ser: 0.65 mg/dL (ref 0.44–1.00)
Glucose, Bld: 129 mg/dL — ABNORMAL HIGH (ref 65–99)
POTASSIUM: 4.4 mmol/L (ref 3.5–5.1)
Sodium: 137 mmol/L (ref 135–145)
TOTAL PROTEIN: 7.7 g/dL (ref 6.5–8.1)

## 2018-01-21 LAB — HCG, QUANTITATIVE, PREGNANCY: HCG, BETA CHAIN, QUANT, S: 2 m[IU]/mL (ref <5)

## 2018-01-21 LAB — TYPE AND SCREEN
ABO/RH(D): O POS
Antibody Screen: NEGATIVE

## 2018-01-21 NOTE — ED Triage Notes (Signed)
Pt c/o of rectal bleeding, abdominal pain, and n/v/d since last night.

## 2018-02-10 ENCOUNTER — Ambulatory Visit (INDEPENDENT_AMBULATORY_CARE_PROVIDER_SITE_OTHER): Payer: 59 | Admitting: Licensed Clinical Social Worker

## 2018-02-10 DIAGNOSIS — F0630 Mood disorder due to known physiological condition, unspecified: Secondary | ICD-10-CM | POA: Diagnosis not present

## 2018-02-10 DIAGNOSIS — F4310 Post-traumatic stress disorder, unspecified: Secondary | ICD-10-CM | POA: Diagnosis not present

## 2018-02-11 ENCOUNTER — Encounter (HOSPITAL_COMMUNITY): Payer: Self-pay | Admitting: Licensed Clinical Social Worker
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
## 2018-02-25 ENCOUNTER — Ambulatory Visit: Payer: Self-pay | Admitting: Psychology

## 2018-03-04 ENCOUNTER — Encounter: Payer: 59 | Admitting: Psychology

## 2018-03-06 ENCOUNTER — Encounter: Payer: 59 | Admitting: Psychology

## 2018-03-10 ENCOUNTER — Ambulatory Visit: Payer: Self-pay | Admitting: Psychology

## 2018-03-17 ENCOUNTER — Ambulatory Visit (HOSPITAL_COMMUNITY): Payer: 59 | Admitting: Licensed Clinical Social Worker
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
## 2018-04-01 ENCOUNTER — Ambulatory Visit (HOSPITAL_COMMUNITY): Payer: Self-pay | Admitting: Psychiatry
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
## 2018-04-14 ENCOUNTER — Encounter (HOSPITAL_COMMUNITY): Payer: Self-pay | Admitting: Psychiatry

## 2018-04-14 ENCOUNTER — Ambulatory Visit (INDEPENDENT_AMBULATORY_CARE_PROVIDER_SITE_OTHER): Payer: 59 | Admitting: Psychiatry

## 2018-04-14 ENCOUNTER — Telehealth (HOSPITAL_COMMUNITY): Payer: Self-pay | Admitting: Psychiatry

## 2018-04-14 VITALS — BP 169/90 | HR 98 | Ht 66.0 in | Wt 358.0 lb

## 2018-04-14 DIAGNOSIS — F4310 Post-traumatic stress disorder, unspecified: Secondary | ICD-10-CM

## 2018-04-14 DIAGNOSIS — F17210 Nicotine dependence, cigarettes, uncomplicated: Secondary | ICD-10-CM | POA: Diagnosis not present

## 2018-04-14 DIAGNOSIS — F0630 Mood disorder due to known physiological condition, unspecified: Secondary | ICD-10-CM | POA: Diagnosis not present

## 2018-04-14 NOTE — Telephone Encounter (Signed)
Patient left in the middle of the interview. Could you ask her the following tomorrow.  - if she needs medication refill - if she has SI/HI. If she is at risk, then discuss emergency resources - advise to make follow up appointment in a month

## 2018-04-14 NOTE — Patient Instructions (Signed)
(  patient left in the middle of the interview)

## 2018-04-15 NOTE — Telephone Encounter (Signed)
Called Patient < Mother answered phone. I requested a call back

## 2018-04-29 ENCOUNTER — Encounter: Payer: 59 | Attending: Psychology | Admitting: Psychology

## 2018-04-29 ENCOUNTER — Encounter: Payer: Self-pay | Admitting: Psychology

## 2018-04-29 DIAGNOSIS — F0630 Mood disorder due to known physiological condition, unspecified: Secondary | ICD-10-CM | POA: Diagnosis not present

## 2018-04-29 DIAGNOSIS — F4310 Post-traumatic stress disorder, unspecified: Secondary | ICD-10-CM | POA: Diagnosis not present

## 2018-04-29 NOTE — Progress Notes (Signed)
Today I administered the Comprehensive Attention Battery and the CAB CPT measures.  The patient fully participated and this does appear to be valid assessment.  The testing took 2 hours and the tests were administered by me personally.  Interpretation and report writing will be completed on 05-06-2018.    Amy PhenixJohn Lachandra Dettmann

## 2018-05-01 ENCOUNTER — Encounter: Payer: 59 | Admitting: Psychology

## 2018-05-06 ENCOUNTER — Encounter: Payer: Self-pay | Admitting: Psychology

## 2018-05-06 ENCOUNTER — Encounter: Payer: 59 | Attending: Psychology | Admitting: Psychology

## 2018-05-06 DIAGNOSIS — F0630 Mood disorder due to known physiological condition, unspecified: Secondary | ICD-10-CM | POA: Diagnosis not present

## 2018-05-06 DIAGNOSIS — F4310 Post-traumatic stress disorder, unspecified: Secondary | ICD-10-CM | POA: Diagnosis not present
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
## 2018-05-15 ENCOUNTER — Encounter: Payer: Self-pay | Admitting: Psychology

## 2018-05-15 ENCOUNTER — Encounter (HOSPITAL_BASED_OUTPATIENT_CLINIC_OR_DEPARTMENT_OTHER): Payer: 59 | Admitting: Psychology

## 2018-05-15 DIAGNOSIS — F0630 Mood disorder due to known physiological condition, unspecified: Secondary | ICD-10-CM | POA: Diagnosis not present

## 2018-05-15 DIAGNOSIS — F4310 Post-traumatic stress disorder, unspecified: Secondary | ICD-10-CM

## 2018-05-15 DIAGNOSIS — F3162 Bipolar disorder, current episode mixed, moderate: Secondary | ICD-10-CM | POA: Diagnosis not present
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
## 2018-05-20 ENCOUNTER — Ambulatory Visit: Payer: Self-pay | Admitting: Psychology
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
## 2018-05-21 ENCOUNTER — Ambulatory Visit (INDEPENDENT_AMBULATORY_CARE_PROVIDER_SITE_OTHER): Payer: 59 | Admitting: Psychiatry

## 2018-05-21 ENCOUNTER — Encounter (HOSPITAL_COMMUNITY): Payer: Self-pay | Admitting: Psychiatry

## 2018-05-21 VITALS — BP 146/104 | HR 94 | Resp 20 | Ht 65.98 in | Wt 354.8 lb

## 2018-05-21 DIAGNOSIS — F0630 Mood disorder due to known physiological condition, unspecified: Secondary | ICD-10-CM

## 2018-05-21 DIAGNOSIS — F4310 Post-traumatic stress disorder, unspecified: Secondary | ICD-10-CM

## 2018-05-21 MED ORDER — ZIPRASIDONE HCL 20 MG PO CAPS: 20.0000 mg | ORAL_CAPSULE | Freq: Two times a day (BID) | ORAL | 0 refills | Status: DC

## 2018-05-21 MED ORDER — CLONAZEPAM 0.5 MG PO TABS: 0.5000 mg | ORAL_TABLET | Freq: Two times a day (BID) | ORAL | 0 refills | Status: DC | PRN

## 2018-05-21 NOTE — Patient Instructions (Signed)
1. Start Geodon 20 mg twice a day  2. Continue clonazepam 0.5 mg twice a day 3. Return to clinic in one month for 30 mins 4. Will make referral for therapy
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
## 2018-06-23 ENCOUNTER — Ambulatory Visit (HOSPITAL_COMMUNITY): Payer: Self-pay | Admitting: Psychiatry

## 2018-06-23 ENCOUNTER — Ambulatory Visit (HOSPITAL_COMMUNITY): Payer: 59 | Admitting: Psychiatry

## 2018-07-18 ENCOUNTER — Ambulatory Visit (HOSPITAL_COMMUNITY): Payer: Self-pay | Admitting: Psychiatry

## 2018-07-24 ENCOUNTER — Ambulatory Visit (HOSPITAL_COMMUNITY): Payer: Self-pay | Admitting: Psychiatry

## 2018-08-23 ENCOUNTER — Encounter (HOSPITAL_COMMUNITY): Payer: Self-pay | Admitting: Emergency Medicine

## 2018-08-23 ENCOUNTER — Emergency Department (HOSPITAL_COMMUNITY)
Admission: EM | Admit: 2018-08-23 | Discharge: 2018-08-23 | Disposition: A | Discharge status: Home / Self Care | Payer: 59 | Attending: Emergency Medicine | Admitting: Emergency Medicine

## 2018-08-23 DIAGNOSIS — L02211 Cutaneous abscess of abdominal wall: Secondary | ICD-10-CM | POA: Insufficient documentation

## 2018-08-23 DIAGNOSIS — I10 Essential (primary) hypertension: Secondary | ICD-10-CM | POA: Diagnosis not present

## 2018-08-23 DIAGNOSIS — M25551 Pain in right hip: Secondary | ICD-10-CM | POA: Diagnosis present

## 2018-08-23 DIAGNOSIS — Z79899 Other long term (current) drug therapy: Secondary | ICD-10-CM | POA: Diagnosis not present

## 2018-08-23 DIAGNOSIS — F17210 Nicotine dependence, cigarettes, uncomplicated: Secondary | ICD-10-CM | POA: Diagnosis not present

## 2018-08-23 DIAGNOSIS — I83811 Varicose veins of right lower extremities with pain: Secondary | ICD-10-CM | POA: Insufficient documentation

## 2018-08-23 DIAGNOSIS — Z7984 Long term (current) use of oral hypoglycemic drugs: Secondary | ICD-10-CM | POA: Insufficient documentation

## 2018-08-23 DIAGNOSIS — L0291 Cutaneous abscess, unspecified: Secondary | ICD-10-CM

## 2018-08-23 DIAGNOSIS — Z7982 Long term (current) use of aspirin: Secondary | ICD-10-CM | POA: Diagnosis not present

## 2018-08-23 DIAGNOSIS — E119 Type 2 diabetes mellitus without complications: Secondary | ICD-10-CM | POA: Diagnosis not present

## 2018-08-23 MED ORDER — LIDOCAINE-EPINEPHRINE (PF) 2 %-1:200000 IJ SOLN
10.0000 mL | Freq: Once | INTRAMUSCULAR | Status: AC
  Administered 2018-08-23: 10 mL
  Filled 2018-08-23: qty 20

## 2018-08-23 NOTE — ED Provider Notes (Signed)
[facility] EMERGENCY DEPARTMENT Provider Note   CSN: [id number] Arrival date & time: 08/23/18  1218     History   Chief Complaint Chief Complaint  Patient presents with  . Hip Pain    HPI Amy Reese is a 42 y.o. female.  Patient presents to the emergency department with 2 complaints today.  Patient has a history of scoliosis is on chronic pain medication for this as well as Lyrica.  She has pain of her right hip area today with some swelling described as "a knot" with a vein over top of it.  Pain is sharp in nature.  She does not have any calf pain or swelling.  She denies any worsening pain or swelling elsewhere in her thigh.  No history of blood clots.  She denies any abdominal pain.  She does have some known lower extremity varicose veins.  These do not typically cause her trouble.  No injury or trauma to the area.  She is able to walk at her baseline.  She also notes a small abscess on her right abdomen.  She states that she gets abscesses about once per year.  They typically will feel better when they are drained.  She is requesting I&D of this area.  No fevers, nausea or vomiting.     Past Medical History:  Diagnosis Date  . Anxiety   . Arthritis   . Bipolar disorder (HCC)   . Depression   . Diabetes mellitus without complication (HCC)    A1c 8.8 05/24/17  . Endometriosis   . Environmental allergies    seasonal and pollen allergies  . Hypertension   . Left knee pain   . Obesity   . Polycystic ovarian disease   . Wears glasses     Patient Active Problem List   Diagnosis Date Noted  . PTSD (post-traumatic stress disorder) 12/16/2017  . Mood disorder in conditions classified elsewhere 11/28/2017  . Loose body of left knee 09/23/2017  . Primary osteoarthritis of left knee 09/23/2017  . Diabetes (HCC) 09/23/2017  . Hemorrhage of right tonsil 06/05/2017  . S/P T&A (status post tonsillectomy and adenoidectomy) 05/29/2017    Past Surgical  History:  Procedure Laterality Date  . ABDOMINAL HYSTERECTOMY    . DILATION AND CURETTAGE OF UTERUS    . KNEE ARTHROSCOPY Left 09/23/2017   Procedure: ARTHROSCOPY KNEE;  Surgeon: Jodi Geralds, MD;  Location: [facility] OR;  Service: Orthopedics;  Laterality: Left;  CHONDROPLASTY, REMOVAL OF LOOSE BODIES   . TONSILLECTOMY N/A 06/05/2017   Procedure: control of oropharyngeal hemorrhage;  Surgeon: Newman Pies, MD;  Location: [facility] OR;  Service: ENT;  Laterality: N/A;  . TONSILLECTOMY AND ADENOIDECTOMY N/A 05/29/2017   Procedure: TONSILLECTOMY AND ADENOIDECTOMY;  Surgeon: Newman Pies, MD;  Location: [facility] OR;  Service: ENT;  Laterality: N/A;  . WISDOM TOOTH EXTRACTION       OB History    Gravida  0   Para  0   Term  0   Preterm  0   AB  0   Living  0     SAB  0   TAB  0   Ectopic  0   Multiple  0   Live Births  0            Home Medications    Prior to Admission medications   Medication Sig Start Date End Date Taking? Authorizing Provider  amLODipine (NORVASC) 10 MG tablet Take 10 mg daily by mouth.  [provider]  Aspirin-Salicylamide-Caffeine (ARTHRITIS STRENGTH BC POWDER PO) Take by mouth daily.    [provider]  cephALEXin (KEFLEX) 500 MG capsule Take 1 capsule (500 mg total) 2 (two) times daily by mouth. 09/23/17   Marshia Ly, PA-C  clonazePAM (KLONOPIN) 0.5 MG tablet Take 1 tablet (0.5 mg total) by mouth 2 (two) times daily as needed for anxiety. 05/21/18   Neysa Hotter, MD  diclofenac sodium (VOLTAREN) 1 % GEL Apply 4 g topically 4 (four) times daily. 08/12/17   Vickki Hearing, MD  ibuprofen (ADVIL,MOTRIN) 800 MG tablet Take 1 tablet (800 mg total) 3 (three) times daily by mouth. 09/09/17   Triplett, Tammy, PA-C  metFORMIN (GLUCOPHAGE) 500 MG tablet Take 1 tablet (500 mg total) daily with breakfast by mouth. 09/23/17 09/23/18  Marshia Ly, PA-C  Multiple Vitamin (DAILY VITAMIN PO) Take by mouth. Mega Red    [provider]  ziprasidone  (GEODON) 20 MG capsule Take 1 capsule (20 mg total) by mouth 2 (two) times daily with a meal. 05/21/18   Neysa Hotter, MD    Family History Family History  Problem Relation Age of Onset  . Breast cancer Mother   . COPD Mother   . Throat cancer Father     Social History Social History   Tobacco Use  . Smoking status: Current Every Day Smoker    Packs/day: 1.00    Types: Cigarettes  . Smokeless tobacco: Never Used  . Tobacco comment: Pt considering nicotine patch  Substance Use Topics  . Alcohol use: No  . Drug use: No     Allergies   Bee venom   Review of Systems Review of Systems  Constitutional: Negative for activity change and fever.  HENT: Negative for rhinorrhea and sore throat.   Eyes: Negative for redness.  Respiratory: Negative for cough.   Cardiovascular: Negative for chest pain.  Gastrointestinal: Negative for abdominal pain, diarrhea, nausea and vomiting.  Genitourinary: Negative for dysuria.  Musculoskeletal: Positive for arthralgias, back pain and myalgias. Negative for joint swelling and neck pain.  Skin: Negative for color change, rash and wound.       Positive for abscess.  Neurological: Negative for weakness, numbness and headaches.  Hematological: Negative for adenopathy.     Physical Exam Updated Vital Signs BP (!) 144/101 (BP Location: Right Arm)   Pulse (!) 107   Temp 98.3 F (36.8 C) (Oral)   Resp (!) 22   SpO2 98%   Physical Exam  Constitutional: She appears well-developed and well-nourished.  HENT:  Head: Normocephalic and atraumatic.  Eyes: Conjunctivae are normal. Right eye exhibits no discharge. Left eye exhibits no discharge.  Neck: Normal range of motion. Neck supple.  Cardiovascular: Normal rate, regular rhythm and normal heart sounds.  Pulmonary/Chest: Effort normal and breath sounds normal.  Abdominal: Soft. There is no tenderness.  No abdominal tenderness.  Neurological: She is alert.  Skin: Skin is warm and dry.    There is a 2 cm area of induration and swelling consistent with small abscess of the right lateral abdomen.  No surrounding cellulitis.  Over the right hip/buttock, patient has an area of varicosity that is distal and a small area of soft tissue swelling with an engorged superficial vein overlying the area without overlying erythema that is more proximal.  Minimal tenderness.  There is no induration or firmness consistent with abscess.   Psychiatric: She has a normal mood and affect.  Nursing note and vitals reviewed.  ED Treatments / Results  Labs (all labs ordered are listed, but only abnormal results are displayed) Labs Reviewed - No data to display  EKG None  Radiology No results found.  Procedures .Marland KitchenIncision and Drainage Date/Time: 08/23/2018 2:07 PM Performed by: Renne Crigler, PA-C Authorized by: Renne Crigler, PA-C   Consent:    Consent obtained:  Verbal   Consent given by:  Patient   Risks discussed:  Bleeding, incomplete drainage, pain and infection   Alternatives discussed:  No treatment Location:    Type:  Abscess   Size:  2cm   Location:  Trunk   Trunk location:  Abdomen (just inferior to R breast on abd wall) Pre-procedure details:    Skin preparation:  Antiseptic wash Anesthesia (see MAR for exact dosages):    Anesthesia method:  Local infiltration   Local anesthetic:  Lidocaine 2% WITH epi Procedure type:    Complexity:  Simple Procedure details:    Incision types:  Stab incision   Scalpel blade:  11   Wound management:  Probed and deloculated   Drainage:  Bloody and purulent   Drainage amount:  Scant   Packing materials:  None Post-procedure details:    Patient tolerance of procedure:  Tolerated well, no immediate complications   (including critical care time)  Medications Ordered in ED Medications - No data to display   Initial Impression / Assessment and Plan / ED Course  I have reviewed the triage vital signs and the nursing  notes.  Pertinent labs & imaging results that were available during my care of the patient were reviewed by me and considered in my medical decision making (see chart for details).     Patient seen and examined.   Vital signs reviewed and are as follows: BP (!) 144/101 (BP Location: Right Arm)   Pulse (!) 107   Temp 98.3 F (36.8 C) (Oral)   Resp (!) 22   SpO2 98%   Discussed with patient that the area of soft tissue swelling on her thigh does not appear to be infectious.  May be related to engorged veins/varicose veins.  She does not have any other symptoms or signs of DVT in her calf or thigh.  She is concerned that she has a blood clot.  Discussed that these veins are superficial and would not cause dangerous blood clots even if she did have one.  Based on her exam I have low suspicion for a deep venous thrombosis.  At this point, we will treat conservatively with anti-inflammatories and warm compresses.  Will proceed with I&D of the small abscess.  2:08 PM incision and drainage performed without complication.  Patient given vascular follow-up if desired for varicose veins.  She is encouraged to use anti-inflammatories and warm compresses on these areas.  We discussed signs and symptoms of DVT and need to return if these occur.  This includes pain in the calf or the thigh with or without worsening swelling.  Patient verbalizes understanding agrees with plan.  Final Clinical Impressions(s) / ED Diagnoses   Final diagnoses:  Varicose veins of right lower extremity with pain  Abscess   Abscess: Cutaneous abscess or possible infected sebaceous cyst, small, drained without complication.  No indications for antibiotics.  Proximal later thigh/buttocks swelling: No signs of cellulitis or abscess.  Patient has some engorged varicosities and superficial veins.  She denies any new worsening or pain in her calf or thigh.  Clinically she does not appear to have a DVT.  She  does not have any  signs symptoms of PE.  No history of blood clots.  Will treat conservatively outpatient follow-up as needed.  Return instructions discussed as above.  ED Discharge Orders    None       Renne Crigler, PA-C 08/23/18 1410    Virgina Norfolk, DO 08/23/18 1929

## 2018-08-23 NOTE — ED Triage Notes (Signed)
Pt presents to ED for assessment right hip pain starting 3 days ago, with a mass to the right hip "with a vein growing out of it".  Patient c/o pain radiating down the back of her right leg.  Patient denies injury.  On pain control for scoliosis.  Sent here from [facility]

## 2018-08-23 NOTE — ED Notes (Signed)
ED Provider at bedside. 

## 2018-08-23 NOTE — Discharge Instructions (Signed)
Please read and follow all provided instructions.  Your diagnoses today include:  1. Varicose veins of right lower extremity with pain   2. Abscess     Tests performed today include:  Vital signs. See below for your results today.   Medications prescribed:   None  Take any prescribed medications only as directed.  Home care instructions:  Follow any educational materials contained in this packet.  BE VERY CAREFUL not to take multiple medicines containing Tylenol (also called acetaminophen). Doing so can lead to an overdose which can damage your liver and cause liver failure and possibly death.   Follow-up instructions: Please follow-up with your primary care provider or the vascular doctor as needed for further evaluation of your pains.  Return instructions:   Please return to the Emergency Department if you experience worsening symptoms.   Please return for ultrasound if you develop pain or swelling in the thigh or lower leg.  Please return if you have any other emergent concerns.  Additional Information:  Your vital signs today were: BP (!) 144/101 (BP Location: Right Arm)    Pulse (!) 107    Temp 98.3 F (36.8 C) (Oral)    Resp (!) 22    SpO2 98%  If your blood pressure (BP) was elevated above 135/85 this visit, please have this repeated by your doctor within one month. --------------

## 2018-08-23 NOTE — ED Notes (Signed)
Patient also showed this RN a small abscess to right side as well that she would like to have lanced.

## 2018-08-23 NOTE — ED Notes (Signed)
Pt verbalized understanding of discharge instructions and denies any further questions at this time.   

## 2018-08-23 NOTE — ED Notes (Signed)
I & D tray bedside 

## 2018-10-15 ENCOUNTER — Other Ambulatory Visit (HOSPITAL_COMMUNITY): Payer: Self-pay | Admitting: Internal Medicine

## 2018-10-15 DIAGNOSIS — Z1231 Encounter for screening mammogram for malignant neoplasm of breast: Secondary | ICD-10-CM
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
## 2018-12-09 ENCOUNTER — Ambulatory Visit (INDEPENDENT_AMBULATORY_CARE_PROVIDER_SITE_OTHER): Payer: 59 | Admitting: Psychiatry

## 2018-12-09 ENCOUNTER — Encounter (HOSPITAL_COMMUNITY): Payer: Self-pay | Admitting: Psychiatry

## 2018-12-09 VITALS — BP 156/84 | HR 98 | Ht 66.0 in | Wt 334.0 lb

## 2018-12-09 DIAGNOSIS — F4310 Post-traumatic stress disorder, unspecified: Secondary | ICD-10-CM

## 2018-12-09 DIAGNOSIS — F0630 Mood disorder due to known physiological condition, unspecified: Secondary | ICD-10-CM

## 2018-12-09 MED ORDER — LAMOTRIGINE 25 MG PO TABS: ORAL_TABLET | ORAL | 0 refills | Status: DC

## 2018-12-09 MED ORDER — CLONAZEPAM 1 MG PO TABS: 1.0000 mg | ORAL_TABLET | Freq: Every day | ORAL | 0 refills | Status: DC | PRN

## 2018-12-09 NOTE — Patient Instructions (Signed)
1. Start lamotrigine 25 mg daily for two weeks, then 50 mg daily  2. Reinitiate clonazepam 1 mg daily as needed for anxiety 2. Return to clinic in one month for 30 mins 3. Referral to therapy

## 2018-12-18 ENCOUNTER — Ambulatory Visit (HOSPITAL_COMMUNITY): Payer: 59 | Admitting: Licensed Clinical Social Worker

## 2018-12-19 ENCOUNTER — Other Ambulatory Visit: Payer: Self-pay | Admitting: Nurse Practitioner

## 2018-12-19 DIAGNOSIS — M5416 Radiculopathy, lumbar region: Secondary | ICD-10-CM

## 2019-01-05 ENCOUNTER — Other Ambulatory Visit (HOSPITAL_COMMUNITY): Payer: Self-pay | Admitting: Psychiatry

## 2019-01-05 MED ORDER — LAMOTRIGINE 25 MG PO TABS: 50.0000 mg | ORAL_TABLET | Freq: Every day | ORAL | 0 refills | Status: DC

## 2019-01-08 ENCOUNTER — Ambulatory Visit (HOSPITAL_COMMUNITY): Payer: 59 | Admitting: Psychiatry

## 2020-01-08 ENCOUNTER — Other Ambulatory Visit (HOSPITAL_COMMUNITY): Payer: Self-pay | Admitting: Internal Medicine

## 2020-01-08 DIAGNOSIS — Z1231 Encounter for screening mammogram for malignant neoplasm of breast: Secondary | ICD-10-CM

## 2020-01-15 ENCOUNTER — Other Ambulatory Visit (HOSPITAL_COMMUNITY): Payer: Self-pay | Admitting: Internal Medicine

## 2020-01-15 DIAGNOSIS — N644 Mastodynia: Secondary | ICD-10-CM

## 2020-01-18 ENCOUNTER — Other Ambulatory Visit (HOSPITAL_COMMUNITY): Payer: Self-pay | Admitting: Internal Medicine

## 2020-01-20 ENCOUNTER — Other Ambulatory Visit (HOSPITAL_COMMUNITY): Payer: Self-pay | Admitting: Internal Medicine

## 2020-01-20 DIAGNOSIS — N644 Mastodynia: Secondary | ICD-10-CM

## 2020-02-02 ENCOUNTER — Ambulatory Visit (HOSPITAL_COMMUNITY)
Admission: RE | Admit: 2020-02-02 | Discharge: 2020-02-02 | Disposition: A | Discharge status: Home / Self Care | Payer: Medicare Other | Source: Ambulatory Visit | Attending: Internal Medicine | Admitting: Internal Medicine

## 2020-02-02 ENCOUNTER — Other Ambulatory Visit: Payer: Self-pay

## 2020-02-02 ENCOUNTER — Encounter (HOSPITAL_COMMUNITY): Payer: Self-pay

## 2020-02-02 DIAGNOSIS — N644 Mastodynia: Secondary | ICD-10-CM | POA: Insufficient documentation
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
## 2020-03-10 ENCOUNTER — Other Ambulatory Visit: Payer: Self-pay

## 2020-03-10 ENCOUNTER — Telehealth (INDEPENDENT_AMBULATORY_CARE_PROVIDER_SITE_OTHER): Payer: Medicare Other | Admitting: Psychiatry

## 2020-03-10 ENCOUNTER — Encounter (HOSPITAL_COMMUNITY): Payer: Self-pay | Admitting: Psychiatry

## 2020-03-10 DIAGNOSIS — F0630 Mood disorder due to known physiological condition, unspecified: Secondary | ICD-10-CM | POA: Diagnosis not present

## 2020-03-10 DIAGNOSIS — F4310 Post-traumatic stress disorder, unspecified: Secondary | ICD-10-CM | POA: Diagnosis not present

## 2020-03-10 DIAGNOSIS — F1121 Opioid dependence, in remission: Secondary | ICD-10-CM | POA: Diagnosis not present

## 2020-03-10 NOTE — Addendum Note (Signed)
Addended by: Neysa Hotter on: 03/10/2020 03:42 PM   Modules accepted: Level of Service

## 2020-03-10 NOTE — Patient Instructions (Addendum)
1. Next appointment: 6/15 at 2:30  2. Consider contacting [facility] for suboxone treatment

## 2020-03-21 ENCOUNTER — Emergency Department (HOSPITAL_COMMUNITY): Payer: Medicare Other

## 2020-03-21 ENCOUNTER — Emergency Department (HOSPITAL_COMMUNITY)
Admission: EM | Admit: 2020-03-21 | Discharge: 2020-03-21 | Disposition: A | Discharge status: Home / Self Care | Payer: Medicare Other | Attending: Emergency Medicine | Admitting: Emergency Medicine

## 2020-03-21 ENCOUNTER — Other Ambulatory Visit: Payer: Self-pay

## 2020-03-21 ENCOUNTER — Encounter (HOSPITAL_COMMUNITY): Payer: Self-pay | Admitting: *Deleted

## 2020-03-21 DIAGNOSIS — Y9301 Activity, walking, marching and hiking: Secondary | ICD-10-CM | POA: Diagnosis not present

## 2020-03-21 DIAGNOSIS — F17210 Nicotine dependence, cigarettes, uncomplicated: Secondary | ICD-10-CM | POA: Insufficient documentation

## 2020-03-21 DIAGNOSIS — Y92512 Supermarket, store or market as the place of occurrence of the external cause: Secondary | ICD-10-CM | POA: Diagnosis not present

## 2020-03-21 DIAGNOSIS — M533 Sacrococcygeal disorders, not elsewhere classified: Secondary | ICD-10-CM

## 2020-03-21 DIAGNOSIS — Y998 Other external cause status: Secondary | ICD-10-CM | POA: Diagnosis not present

## 2020-03-21 DIAGNOSIS — W010XXA Fall on same level from slipping, tripping and stumbling without subsequent striking against object, initial encounter: Secondary | ICD-10-CM | POA: Insufficient documentation

## 2020-03-21 DIAGNOSIS — M25531 Pain in right wrist: Secondary | ICD-10-CM

## 2020-03-21 DIAGNOSIS — I10 Essential (primary) hypertension: Secondary | ICD-10-CM | POA: Diagnosis not present

## 2020-03-21 DIAGNOSIS — E119 Type 2 diabetes mellitus without complications: Secondary | ICD-10-CM | POA: Diagnosis not present

## 2020-03-21 DIAGNOSIS — Z79899 Other long term (current) drug therapy: Secondary | ICD-10-CM | POA: Diagnosis not present

## 2020-03-21 DIAGNOSIS — W19XXXA Unspecified fall, initial encounter: Secondary | ICD-10-CM

## 2020-03-21 DIAGNOSIS — M25561 Pain in right knee: Secondary | ICD-10-CM | POA: Diagnosis not present

## 2020-03-21 MED ORDER — DICLOFENAC SODIUM 1 % EX GEL: 2.0000 g | Freq: Four times a day (QID) | CUTANEOUS | 0 refills | Status: AC | PRN

## 2020-03-21 MED ORDER — ACETAMINOPHEN 500 MG PO TABS: 500.0000 mg | ORAL_TABLET | Freq: Four times a day (QID) | ORAL | 0 refills | Status: DC | PRN

## 2020-03-21 MED ORDER — ACETAMINOPHEN 500 MG PO TABS
1000.0000 mg | ORAL_TABLET | Freq: Once | ORAL | Status: AC
  Administered 2020-03-21: 1000 mg via ORAL
  Filled 2020-03-21: qty 2

## 2020-03-21 NOTE — Discharge Instructions (Addendum)
1. Medications: Take extra strength Tylenol every 6 hours as needed for pain.  Do not exceed more than 4000 mg of Tylenol daily.  You can apply Voltaren gel to areas of pain as directed. 2. Treatment: rest, apply ice or heat whichever feels best 20 minutes at a time a few times daily, elevate and use brace, drink plenty of fluids, gentle stretching 3. Follow Up: Please followup with orthopedics as directed or your PCP in 1 week if no improvement for discussion of your diagnoses and further evaluation after today's visit; if you do not have a primary care doctor use the resource guide provided to find one; Please return to the ER for worsening symptoms or other concerns such as worsening swelling, redness of the skin, fevers, loss of pulses, or loss of feeling

## 2020-03-21 NOTE — ED Notes (Signed)
Given ice pack for right wrist and right knee.

## 2020-03-21 NOTE — ED Triage Notes (Signed)
Pt c/o right knee pain after slipping and falling on grapes while at the grocery store; pt also c/o right wrist pain and bilateral buttock pain

## 2020-03-21 NOTE — ED Provider Notes (Signed)
[facility] EMERGENCY DEPARTMENT Provider Note   CSN: [id number] Arrival date & time: 03/21/20  1709     History Chief Complaint  Patient presents with  . Fall    Amy Reese is a 44 y.o. female patient with history of diabetes mellitus, anxiety, hypertension, obesity, PCOS presents for evaluation of acute onset, persistent right wrist, right knee, sacral pain secondary to injury earlier today.  She reports that she was at a grocery store in the frozen food section when she slipped on a grape on the ground causing her to hyperextend her right lower extremity, fall on her outstretched right upper extremity and then fall back onto her buttocks.  Denies head injury or loss of consciousness.  She is not on any blood thinners.  She reports soreness to the ulnar aspect of the right wrist radiating to the right hand, sharp pains to the lateral aspect of the right knee and sacral region.  She has a history of prior sacrum/coccyx fracture and states this feels similar.  Has not noticed any rectal bleeding.  Denies back pain.  She is in the process of weight loss for elective knee replacement.  Unsure if her tetanus is up-to-date.  She has been able to bear weight but with difficulty.   The history is provided by the patient.       Past Medical History:  Diagnosis Date  . Anxiety   . Arthritis   . Bipolar disorder (HCC)   . Depression   . Diabetes mellitus without complication (HCC)    A1c 8.8 05/24/17  . Endometriosis   . Environmental allergies    seasonal and pollen allergies  . Hypertension   . Left knee pain   . Obesity   . Polycystic ovarian disease   . Wears glasses     Patient Active Problem List   Diagnosis Date Noted  . PTSD (post-traumatic stress disorder) 12/16/2017  . Mood disorder in conditions classified elsewhere 11/28/2017  . Loose body of left knee 09/23/2017  . Primary osteoarthritis of left knee 09/23/2017  . Diabetes (HCC) 09/23/2017  . Hemorrhage of right  tonsil 06/05/2017  . S/P T&A (status post tonsillectomy and adenoidectomy) 05/29/2017    Past Surgical History:  Procedure Laterality Date  . ABDOMINAL HYSTERECTOMY    . DILATION AND CURETTAGE OF UTERUS    . KNEE ARTHROSCOPY Left 09/23/2017   Procedure: ARTHROSCOPY KNEE;  Surgeon: Jodi Geralds, MD;  Location: [facility] OR;  Service: Orthopedics;  Laterality: Left;  CHONDROPLASTY, REMOVAL OF LOOSE BODIES   . TONSILLECTOMY N/A 06/05/2017   Procedure: control of oropharyngeal hemorrhage;  Surgeon: Newman Pies, MD;  Location: [facility] OR;  Service: ENT;  Laterality: N/A;  . TONSILLECTOMY AND ADENOIDECTOMY N/A 05/29/2017   Procedure: TONSILLECTOMY AND ADENOIDECTOMY;  Surgeon: Newman Pies, MD;  Location: [facility] OR;  Service: ENT;  Laterality: N/A;  . WISDOM TOOTH EXTRACTION       OB History    Gravida  0   Para  0   Term  0   Preterm  0   AB  0   Living  0     SAB  0   TAB  0   Ectopic  0   Multiple  0   Live Births  0           Family History  Problem Relation Age of Onset  . Breast cancer Mother   . COPD Mother   . Throat cancer Father   . Breast cancer  Maternal Grandmother     Social History   Tobacco Use  . Smoking status: Current Every Day Smoker    Packs/day: 1.00    Types: Cigarettes  . Smokeless tobacco: Never Used  . Tobacco comment: Pt considering nicotine patch  Substance Use Topics  . Alcohol use: No  . Drug use: No    Home Medications Prior to Admission medications   Medication Sig Start Date End Date Taking? Authorizing Provider  acetaminophen (TYLENOL) 500 MG tablet Take 1 tablet (500 mg total) by mouth every 6 (six) hours as needed. 03/21/20   Jamelia Varano A, PA-C  clonazePAM (KLONOPIN) 0.5 MG tablet Take 1 tablet (0.5 mg total) by mouth 2 (two) times daily as needed for anxiety. Patient not taking: Reported on 03/10/2020 05/21/18   Neysa Hotter, MD  clonazePAM (KLONOPIN) 1 MG tablet Take 1 tablet (1 mg total) by mouth daily as needed for anxiety. 12/09/18   Neysa Hotter, MD  diclofenac Sodium (VOLTAREN) 1 % GEL Apply 2 g topically 4 (four) times daily as needed (pain). 03/21/20   Sheana Bir A, PA-C  gabapentin (NEURONTIN) 100 MG capsule Take 100 mg by mouth 3 (three) times daily.    [provider]  ibuprofen (ADVIL,MOTRIN) 800 MG tablet Take 1 tablet (800 mg total) 3 (three) times daily by mouth. 09/09/17   Triplett, Tammy, PA-C  losartan (COZAAR) 50 MG tablet Take 50 mg by mouth daily.    [provider]  Multiple Vitamin (DAILY VITAMIN PO) Take by mouth. Mega Red    [provider]    Allergies    Bee venom  Review of Systems   Review of Systems  Constitutional: Negative for chills and fever.  Musculoskeletal: Positive for arthralgias. Negative for neck pain.  Neurological: Negative for syncope, weakness, numbness and headaches.  All other systems reviewed and are negative.   Physical Exam Updated Vital Signs BP (!) 137/95 (BP Location: Left Arm)   Pulse 80   Temp 97.9 F (36.6 C) (Oral)   Resp 18   Ht 5\' 6"  (1.676 m)   Wt (!) 142.9 kg   SpO2 96%   BMI 50.84 kg/m   Physical Exam Vitals and nursing note reviewed.  Constitutional:      General: She is not in acute distress.    Appearance: She is well-developed.  HENT:     Head: Normocephalic and atraumatic.  Eyes:     General:        Right eye: No discharge.        Left eye: No discharge.     Conjunctiva/sclera: Conjunctivae normal.  Neck:     Vascular: No JVD.     Trachea: No tracheal deviation.  Cardiovascular:     Rate and Rhythm: Normal rate.     Pulses: Normal pulses.     Comments: 2+ radial and DP/PT pulses bilaterally, Homans sign absent bilaterally, no lower extremity edema, no palpable cords, compartments are soft   Pulmonary:     Effort: Pulmonary effort is normal.  Abdominal:     General: There is no distension.  Musculoskeletal:        General: Tenderness present.     Comments: Superficial abrasion to the anterior aspect of the  right knee.  Markedly tender to palpation overlying the lateral aspect of the right knee, LCL, lateral joint line.  A limited range of motion.  She is able to extend the extremity against gravity.  Also has tenderness to palpation overlying  the ulnar aspect of the right wrist, right fifth metacarpal and right fourth and fifth digits with no crepitus or deformity.  No snuffbox tenderness noted.  5/5 strength of BUE and BLE major muscle groups.  No midline lumbar spine tenderness noted.  Bilateral paralumbar muscle tenderness noted, worse on the right.  No deformity, crepitus, or step-off.  Skin:    General: Skin is warm and dry.     Capillary Refill: Capillary refill takes less than 2 seconds.     Findings: No erythema.  Neurological:     Mental Status: She is alert.     Comments: Speech is fluent and goal oriented.  No aphasia or facial droop.  Sensation intact to light touch of upper and lower extremities.  Psychiatric:        Behavior: Behavior normal.     ED Results / Procedures / Treatments   Labs (all labs ordered are listed, but only abnormal results are displayed) Labs Reviewed - No data to display  EKG None  Radiology DG Sacrum/Coccyx  Result Date: 03/21/2020 CLINICAL DATA:  Initial evaluation for acute trauma, fall. EXAM: SACRUM AND COCCYX - 2+ VIEW COMPARISON:  None. FINDINGS: There is no evidence of fracture or other focal bone lesions. Degenerative spondylosis noted within the lower lumbar spine. Mild osteoarthritic changes about the hips bilaterally. No visible soft tissue injury. IMPRESSION: No acute osseous abnormality about the sacrum/coccyx. Electronically Signed   By: Rise Mu M.D.   On: 03/21/2020 19:23   DG Wrist Complete Right  Result Date: 03/21/2020 CLINICAL DATA:  In initial evaluation for acute trauma, fall. EXAM: RIGHT WRIST - COMPLETE 3+ VIEW COMPARISON:  None. FINDINGS: There is no evidence of fracture or dislocation. There is no evidence of  arthropathy or other focal bone abnormality. Soft tissues are unremarkable. IMPRESSION: No acute osseous abnormality about the right wrist. Electronically Signed   By: Rise Mu M.D.   On: 03/21/2020 19:21   DG Knee Complete 4 Views Right  Result Date: 03/21/2020 CLINICAL DATA:  Initial evaluation for acute trauma, fall. EXAM: RIGHT KNEE - COMPLETE 4+ VIEW COMPARISON:  None. FINDINGS: No acute fracture or dislocation. No joint effusion. Moderate degenerative osteoarthritic changes present about the knee, most notable at the lateral femorotibial joint space compartment. Mild osteopenia. No visible soft tissue injury. IMPRESSION: 1. No acute osseous abnormality about the right knee. 2. Moderate degenerative osteoarthritic changes, most notable at the lateral femorotibial joint space compartment. Electronically Signed   By: Rise Mu M.D.   On: 03/21/2020 19:19   DG Hand Complete Right  Result Date: 03/21/2020 CLINICAL DATA:  Initial evaluation for acute trauma, fall. EXAM: RIGHT HAND - COMPLETE 3+ VIEW COMPARISON:  None. FINDINGS: No acute fracture or dislocation. Mild osteoarthritic changes present about the hand. No focal osseous lesions. No visible soft tissue injury. IMPRESSION: No acute osseous abnormality about the right hand. Electronically Signed   By: Rise Mu M.D.   On: 03/21/2020 19:29    Procedures Procedures (including critical care time)  Medications Ordered in ED Medications  acetaminophen (TYLENOL) tablet 1,000 mg (1,000 mg Oral Given 03/21/20 1902)    ED Course  I have reviewed the triage vital signs and the nursing notes.  Pertinent labs & imaging results that were available during my care of the patient were reviewed by me and considered in my medical decision making (see chart for details).    MDM Rules/Calculators/A&P  Patient presenting for evaluation of arthralgias after mechanical fall.  Specifically complaining of  right wrist and hand pain, sacral/coccyx pain, right knee pain.  She is afebrile, initially hypertensive with improvement on reevaluation.  Vital signs otherwise stable.  She is nontoxic in appearance.  She is neurovascularly intact.  She is able to ambulate in the ED despite pain.  Radiographs show no acute osseous abnormality.  She has no evidence of serious head injury.  No midline spine tenderness on examination.  She is in the process of scheduling referral to orthopedist for knee replacement.  We'll give hinged knee brace, wrist splint for comfort.  Discussed conservative therapy and management with Tylenol, Voltaren gel, gentle stretching, elevation, ice.  Recommend follow-up with orthopedics on an outpatient basis for reevaluation.  Discussed strict ED return precautions. Patient verbalized understanding of and agreement with plan and is safe for discharge home at this time.    Final Clinical Impression(s) / ED Diagnoses Final diagnoses:  Fall, initial encounter  Right wrist pain  Acute pain of right knee  Pain, coccyx    Rx / DC Orders ED Discharge Orders         Ordered    diclofenac Sodium (VOLTAREN) 1 % GEL  4 times daily PRN     03/21/20 1953    acetaminophen (TYLENOL) 500 MG tablet  Every 6 hours PRN     03/21/20 1953           Renita Papa, PA-C 03/21/20 2014    Margette Fast, MD 03/21/20 [id number]

## 2020-03-28 ENCOUNTER — Other Ambulatory Visit: Payer: Self-pay | Admitting: Orthopedic Surgery

## 2020-03-28 DIAGNOSIS — S83206A Unspecified tear of unspecified meniscus, current injury, right knee, initial encounter: Secondary | ICD-10-CM
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
## 2020-04-19 ENCOUNTER — Encounter (HOSPITAL_COMMUNITY): Payer: Self-pay | Admitting: Psychiatry

## 2020-04-19 ENCOUNTER — Other Ambulatory Visit: Payer: Self-pay

## 2020-04-19 ENCOUNTER — Telehealth (INDEPENDENT_AMBULATORY_CARE_PROVIDER_SITE_OTHER): Payer: Medicare Other | Admitting: Psychiatry

## 2020-04-19 DIAGNOSIS — F1121 Opioid dependence, in remission: Secondary | ICD-10-CM

## 2020-04-19 DIAGNOSIS — F4310 Post-traumatic stress disorder, unspecified: Secondary | ICD-10-CM

## 2020-04-19 DIAGNOSIS — F0630 Mood disorder due to known physiological condition, unspecified: Secondary | ICD-10-CM

## 2020-04-19 NOTE — Patient Instructions (Signed)
Return as needed

## 2020-04-20 ENCOUNTER — Ambulatory Visit
Admission: RE | Admit: 2020-04-20 | Discharge: 2020-04-20 | Disposition: A | Discharge status: Home / Self Care | Payer: Medicare Other | Source: Ambulatory Visit | Attending: Orthopedic Surgery | Admitting: Orthopedic Surgery

## 2020-04-20 ENCOUNTER — Other Ambulatory Visit: Payer: Self-pay

## 2020-04-20 DIAGNOSIS — S83206A Unspecified tear of unspecified meniscus, current injury, right knee, initial encounter: Secondary | ICD-10-CM

## 2021-06-13 IMAGING — DX DG KNEE COMPLETE 4+V*R*
4 series · 4 of 4 positions shown · non-contrast
Comparison: None.

CLINICAL DATA: Initial evaluation for acute trauma, fall.

EXAM:
RIGHT KNEE - COMPLETE 4+ VIEW

[knee ap (1 of 2)]
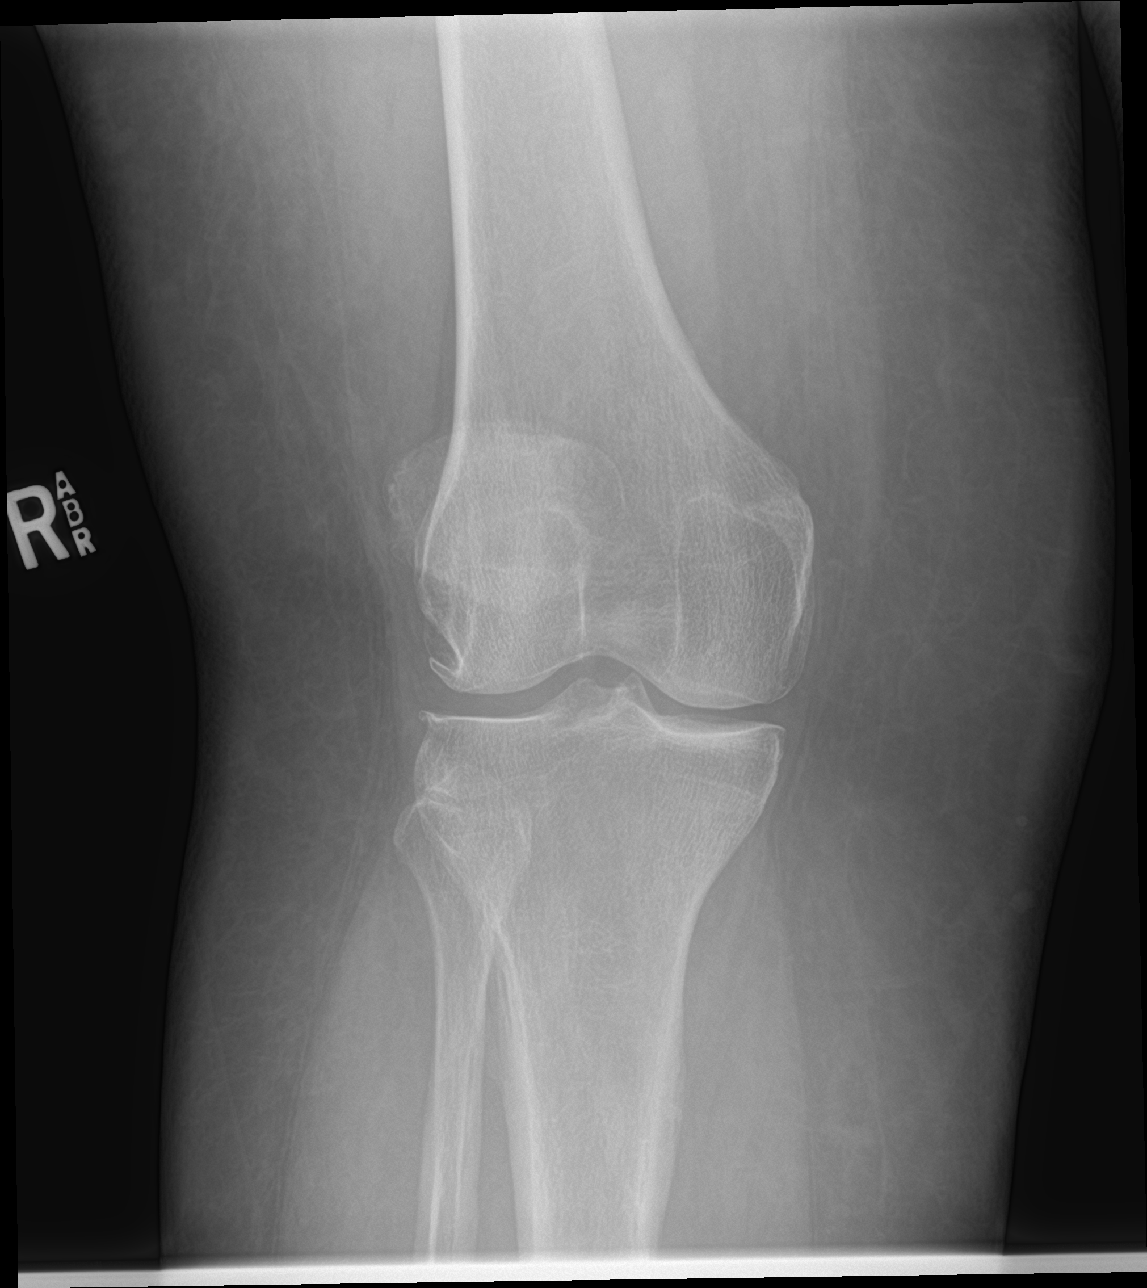

[knee obl]
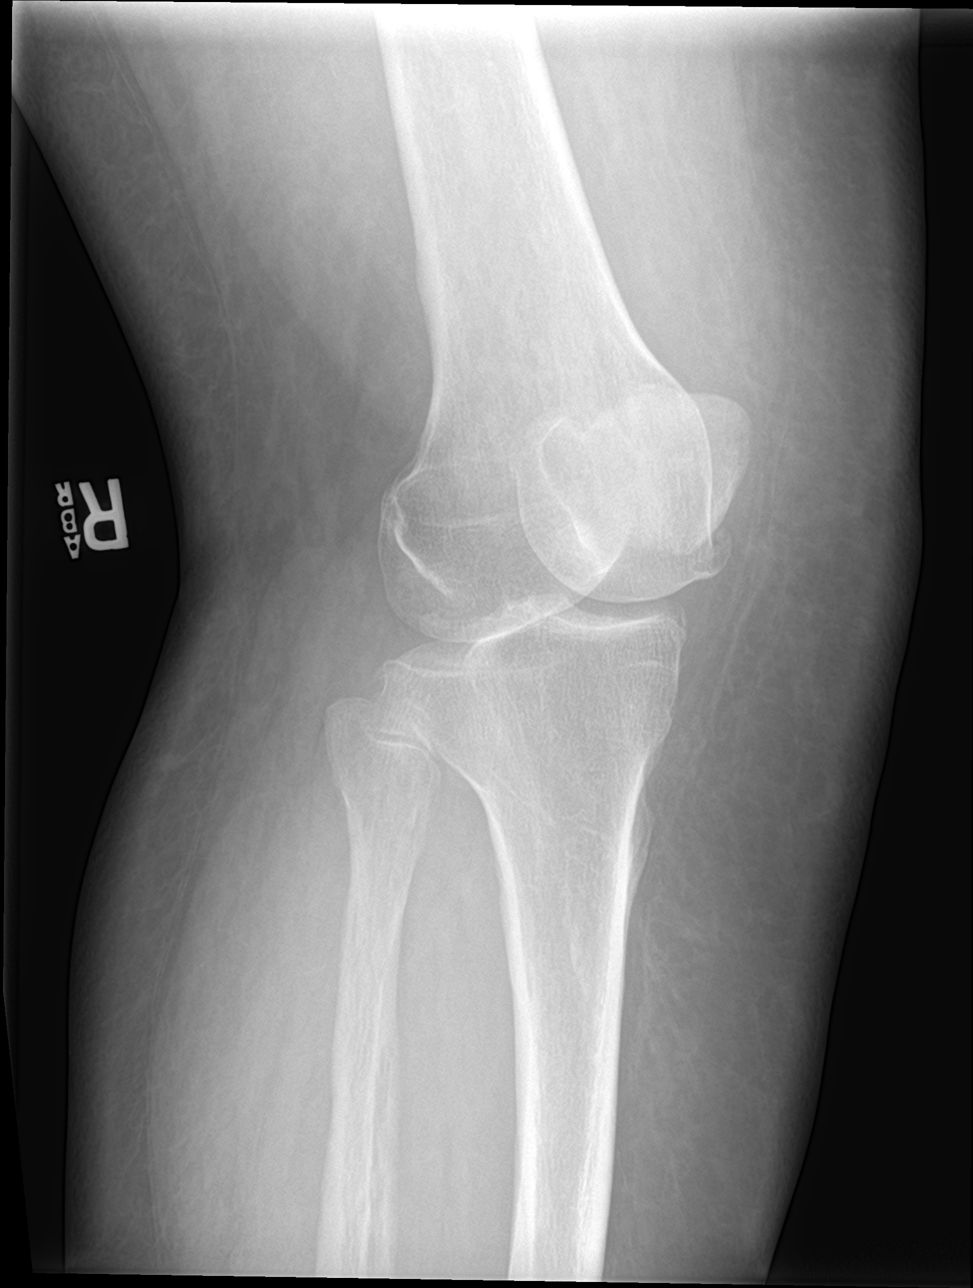

[knee lat]
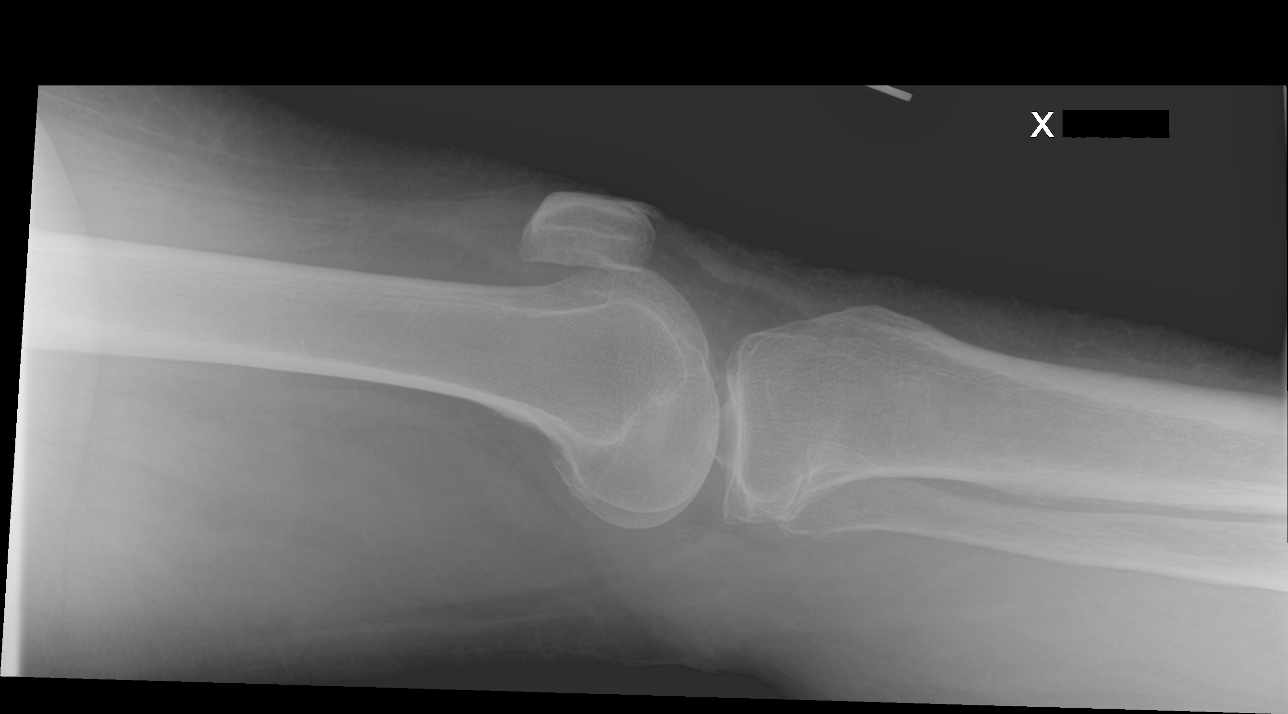

[knee ap (2 of 2)]
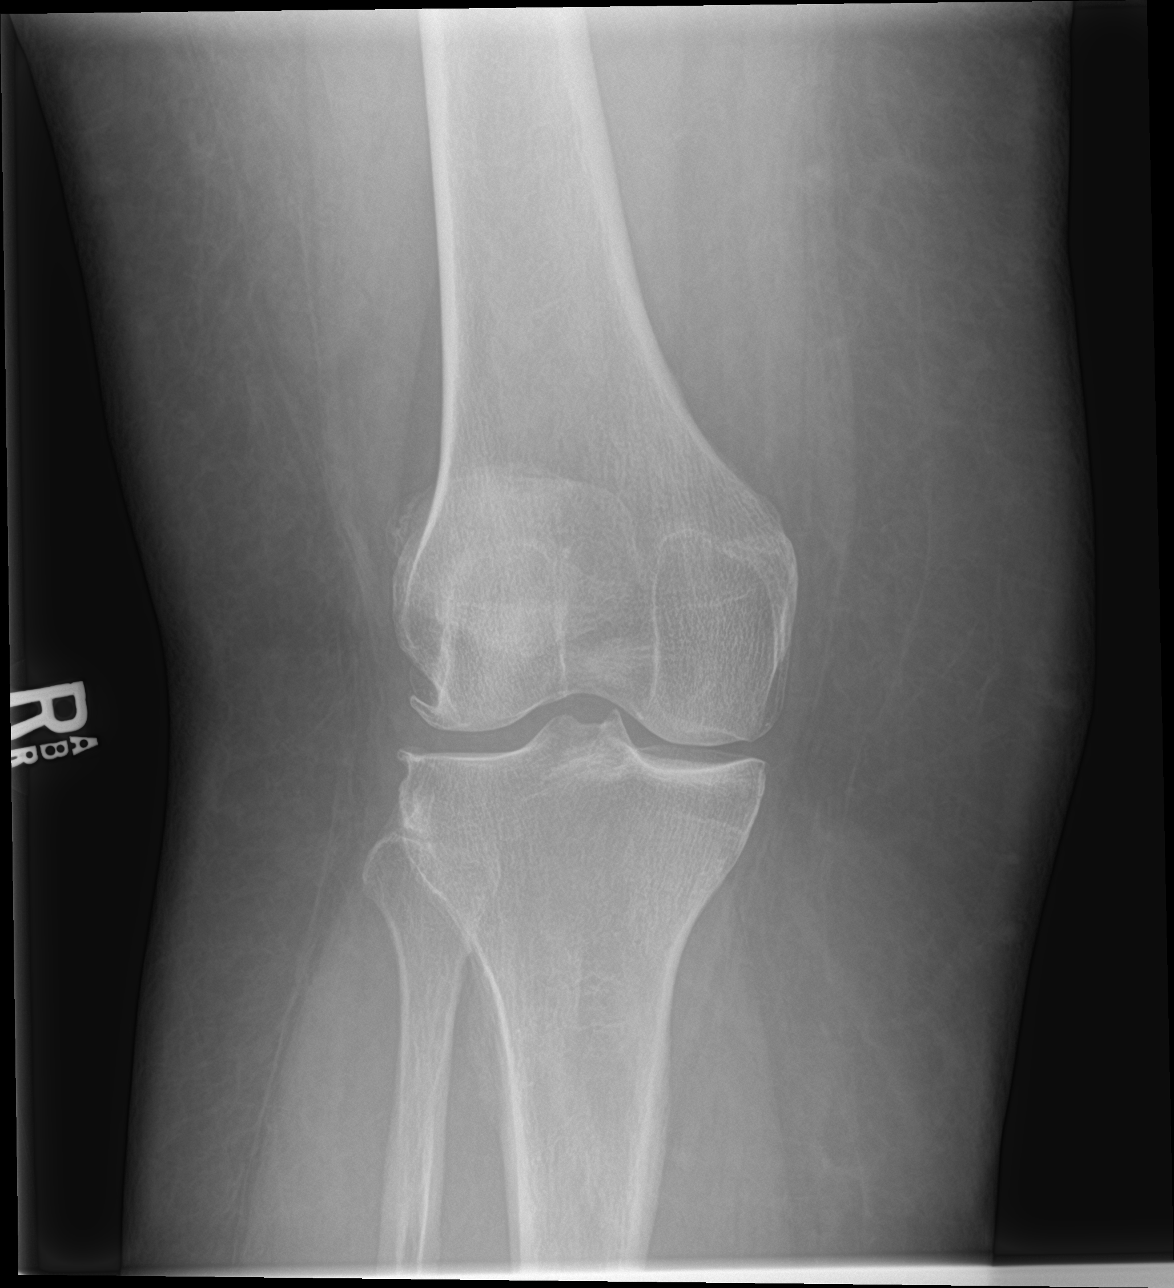

[4 of 4 positions shown; findings below may reference images not displayed]

FINDINGS: No acute fracture or dislocation. No joint effusion. Moderate
degenerative osteoarthritic changes present about the knee, most
notable at the lateral femorotibial joint space compartment. Mild
osteopenia. No visible soft tissue injury.
IMPRESSION: 1. No acute osseous abnormality about the right knee.
2. Moderate degenerative osteoarthritic changes, most notable at the
lateral femorotibial joint space compartment.

## 2021-06-13 IMAGING — DX DG WRIST COMPLETE 3+V*R*
4 series · 4 of 4 positions shown · non-contrast
Comparison: None.

CLINICAL DATA: In initial evaluation for acute trauma, fall.

EXAM:
RIGHT WRIST - COMPLETE 3+ VIEW

[wrist pa]
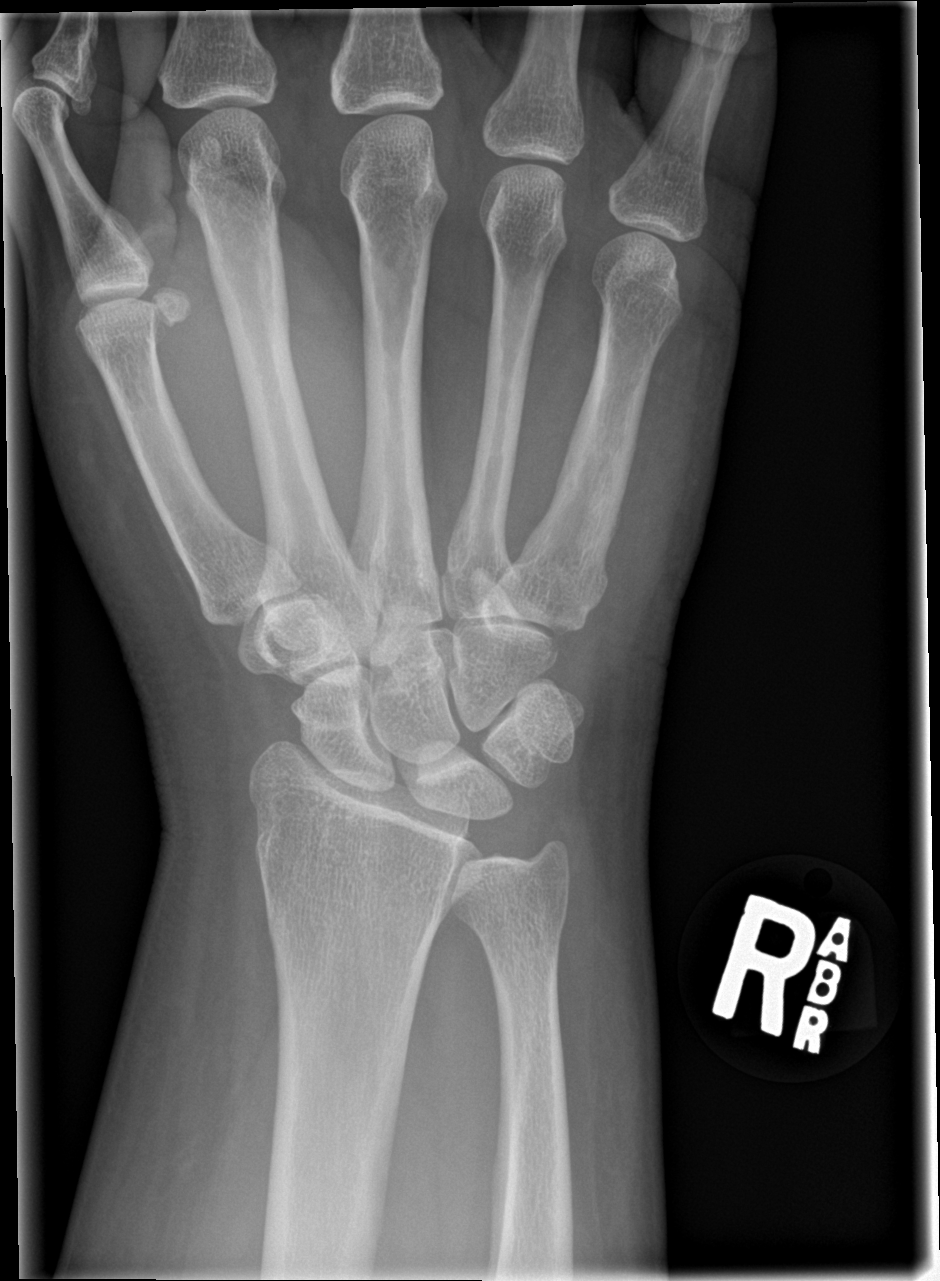

[wrist obl]
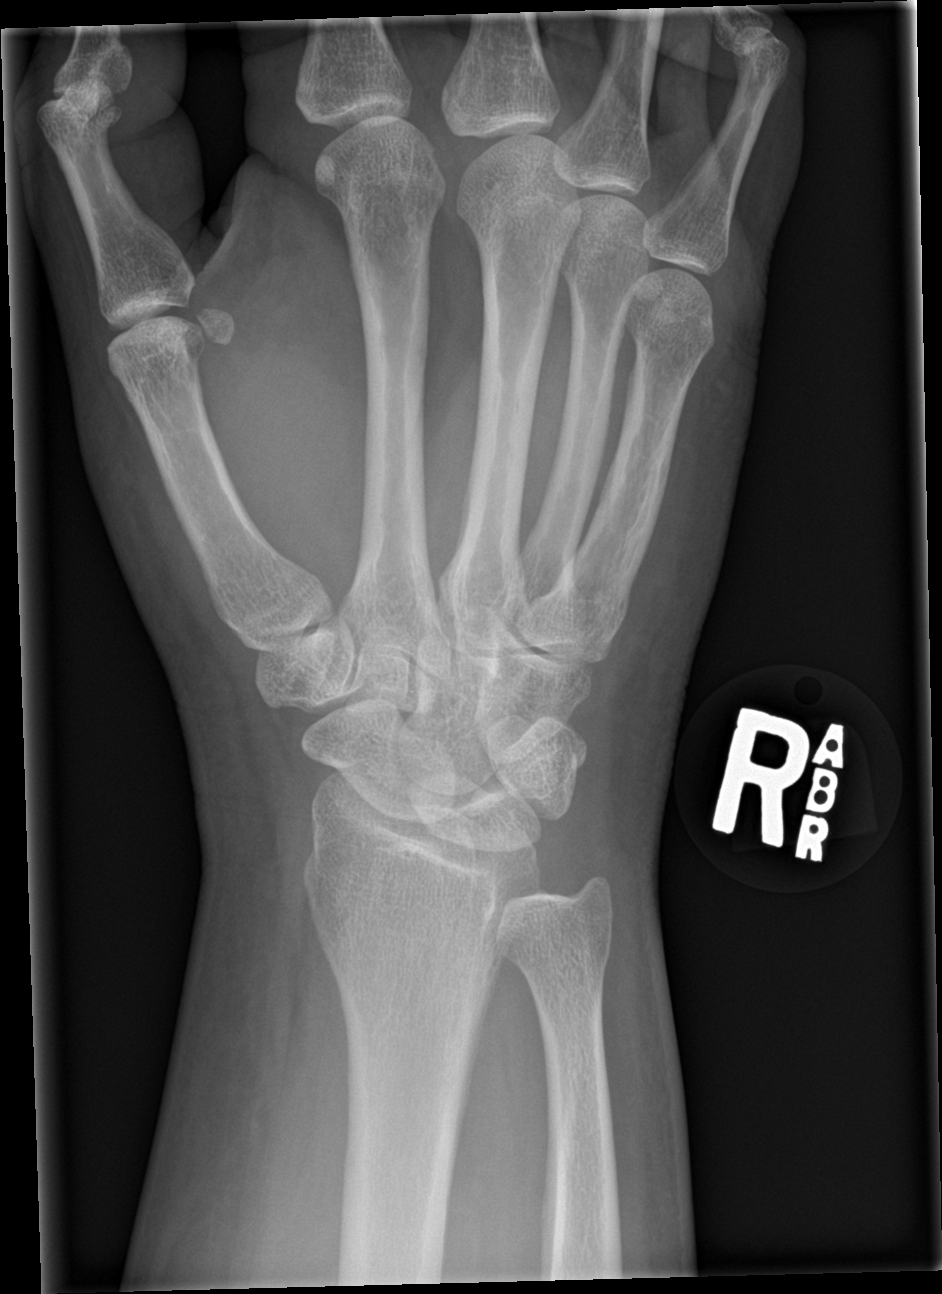

[wrist lat]
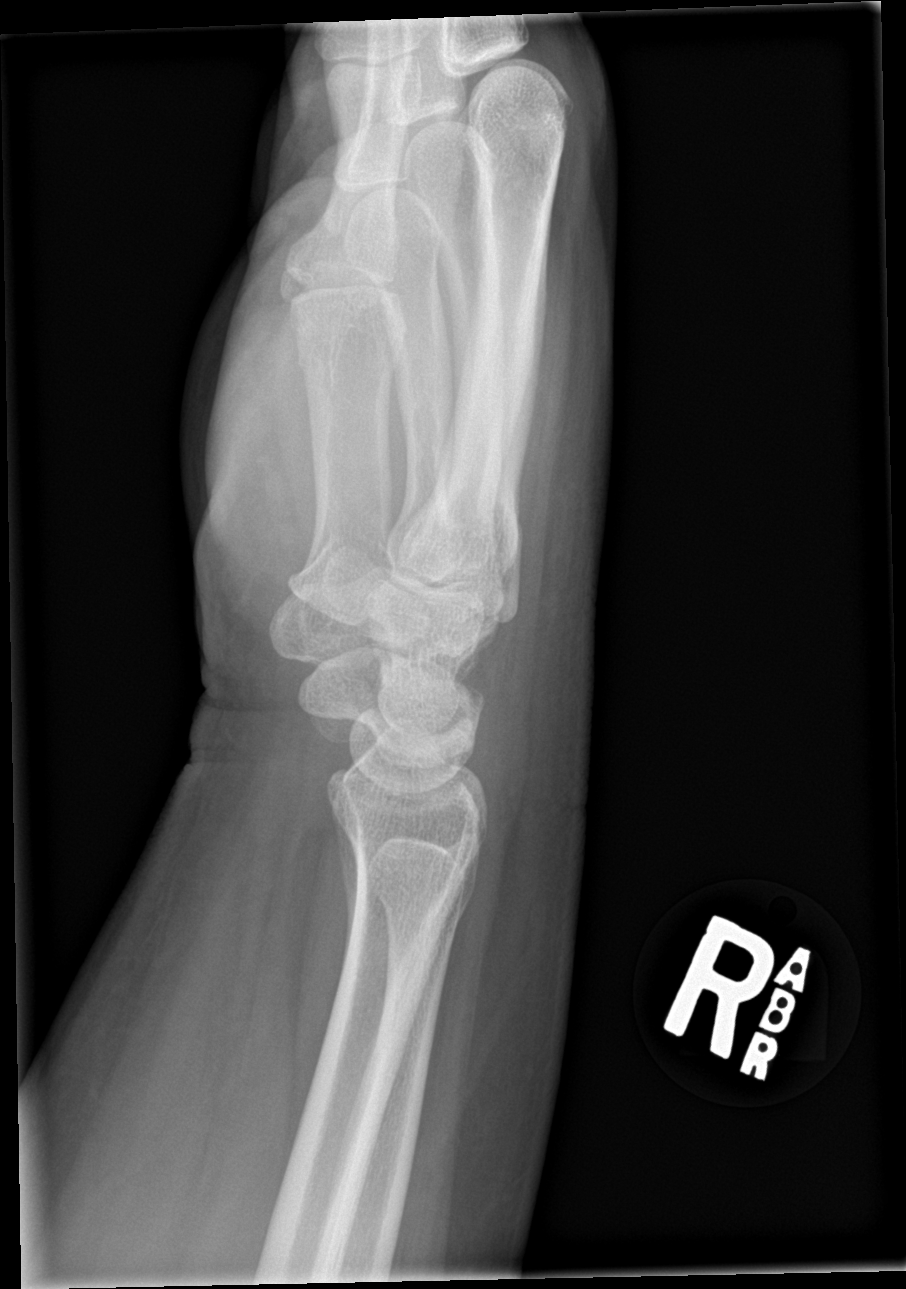

[wrist navicular]
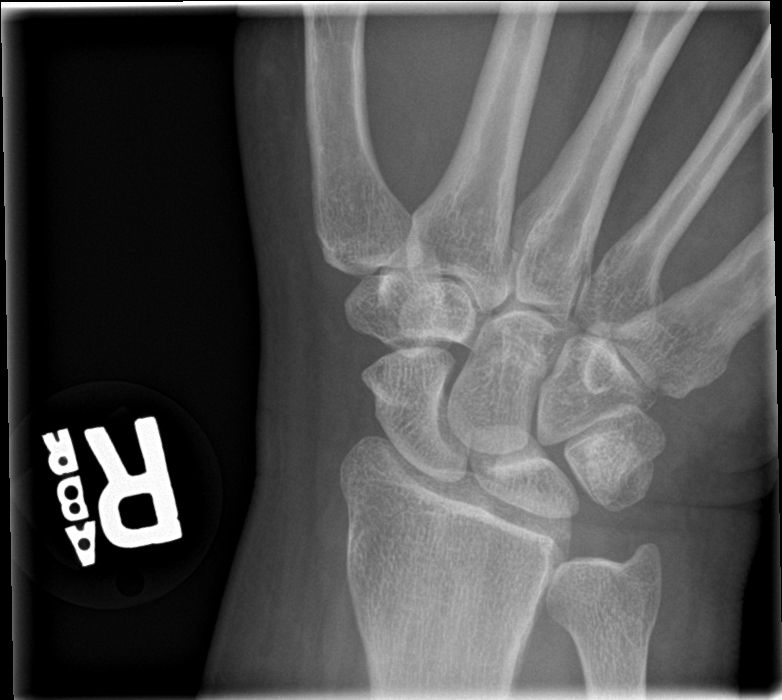

[4 of 4 positions shown; findings below may reference images not displayed]

FINDINGS: There is no evidence of fracture or dislocation. There is no
evidence of arthropathy or other focal bone abnormality. Soft
tissues are unremarkable.
IMPRESSION: No acute osseous abnormality about the right wrist.

## 2021-10-04 ENCOUNTER — Emergency Department (HOSPITAL_COMMUNITY)
Admission: EM | Admit: 2021-10-04 | Discharge: 2021-10-04 | Disposition: A | Discharge status: Home / Self Care | Payer: Medicare Other | Attending: Emergency Medicine | Admitting: Emergency Medicine

## 2021-10-04 ENCOUNTER — Other Ambulatory Visit: Payer: Self-pay

## 2021-10-04 ENCOUNTER — Encounter (HOSPITAL_COMMUNITY): Payer: Self-pay | Admitting: *Deleted

## 2021-10-04 ENCOUNTER — Emergency Department (HOSPITAL_COMMUNITY): Payer: Medicare Other

## 2021-10-04 DIAGNOSIS — Z79899 Other long term (current) drug therapy: Secondary | ICD-10-CM | POA: Insufficient documentation

## 2021-10-04 DIAGNOSIS — M549 Dorsalgia, unspecified: Secondary | ICD-10-CM | POA: Diagnosis not present

## 2021-10-04 DIAGNOSIS — Z20822 Contact with and (suspected) exposure to covid-19: Secondary | ICD-10-CM | POA: Insufficient documentation

## 2021-10-04 DIAGNOSIS — R0789 Other chest pain: Secondary | ICD-10-CM | POA: Diagnosis not present

## 2021-10-04 DIAGNOSIS — R110 Nausea: Secondary | ICD-10-CM | POA: Diagnosis not present

## 2021-10-04 DIAGNOSIS — R519 Headache, unspecified: Secondary | ICD-10-CM | POA: Insufficient documentation

## 2021-10-04 DIAGNOSIS — I10 Essential (primary) hypertension: Secondary | ICD-10-CM | POA: Insufficient documentation

## 2021-10-04 DIAGNOSIS — F17210 Nicotine dependence, cigarettes, uncomplicated: Secondary | ICD-10-CM | POA: Diagnosis not present

## 2021-10-04 DIAGNOSIS — R079 Chest pain, unspecified: Secondary | ICD-10-CM

## 2021-10-04 DIAGNOSIS — R0602 Shortness of breath: Secondary | ICD-10-CM | POA: Diagnosis not present

## 2021-10-04 DIAGNOSIS — E119 Type 2 diabetes mellitus without complications: Secondary | ICD-10-CM | POA: Diagnosis not present

## 2021-10-04 LAB — RESP PANEL BY RT-PCR (FLU A&B, COVID) ARPGX2
Influenza A by PCR: NEGATIVE
Influenza B by PCR: NEGATIVE
SARS Coronavirus 2 by RT PCR: NEGATIVE

## 2021-10-04 LAB — CBC
HCT: 46 % (ref 36.0–46.0)
Hemoglobin: 15.3 g/dL — ABNORMAL HIGH (ref 12.0–15.0)
MCH: 31.4 pg (ref 26.0–34.0)
MCHC: 33.3 g/dL (ref 30.0–36.0)
MCV: 94.5 fL (ref 80.0–100.0)
Platelets: 202 10*3/uL (ref 150–400)
RBC: 4.87 MIL/uL (ref 3.87–5.11)
RDW: 13.1 % (ref 11.5–15.5)
WBC: 7.5 10*3/uL (ref 4.0–10.5)
nRBC: 0 % (ref 0.0–0.2)

## 2021-10-04 LAB — TROPONIN I (HIGH SENSITIVITY): Troponin I (High Sensitivity): 2 ng/L (ref <18)

## 2021-10-04 LAB — BASIC METABOLIC PANEL
Anion gap: 9 (ref 5–15)
BUN: 11 mg/dL (ref 6–20)
CO2: 26 mmol/L (ref 22–32)
Calcium: 8.8 mg/dL — ABNORMAL LOW (ref 8.9–10.3)
Chloride: 102 mmol/L (ref 98–111)
Creatinine, Ser: 0.69 mg/dL (ref 0.44–1.00)
GFR, Estimated: >60 mL/min (ref >60)
Glucose, Bld: 114 mg/dL — ABNORMAL HIGH (ref 70–99)
Potassium: 3.8 mmol/L (ref 3.5–5.1)
Sodium: 137 mmol/L (ref 135–145)

## 2021-10-04 LAB — D-DIMER, QUANTITATIVE: D-Dimer, Quant: 0.37 ug/mL-FEU (ref 0.00–0.50)

## 2021-10-04 MED ORDER — KETOROLAC TROMETHAMINE 30 MG/ML IJ SOLN
30.0000 mg | Freq: Once | INTRAMUSCULAR | Status: AC
  Administered 2021-10-04: 30 mg via INTRAVENOUS
  Filled 2021-10-04: qty 1

## 2021-10-04 MED ORDER — ASPIRIN 81 MG PO CHEW
324.0000 mg | CHEWABLE_TABLET | Freq: Once | ORAL | Status: AC
  Administered 2021-10-04: 324 mg via ORAL
  Filled 2021-10-04: qty 4

## 2021-10-04 NOTE — Discharge Instructions (Signed)
You are seen in the emergency department for chest pressure, sharp chest pain along with headache.  You had lab work done along with an EKG and a chest x-ray that did not show any serious findings.  Your symptoms improved with some medication here.  Please contact your primary care doctor for close follow-up.  Return to the emergency department if any worsening or concerning symptoms

## 2021-10-04 NOTE — ED Triage Notes (Signed)
Pt c/o chest pain that started today when she woke up; pt states the pain starts under her left breast and radiates around to her back; pt describes the pain as sharp at times and states she feels like she is unable to get a deep breath; pt c/o nausea and sob

## 2021-10-04 NOTE — ED Notes (Signed)
ED Provider at bedside. 

## 2021-10-04 NOTE — ED Notes (Signed)
Patient left ED with ABCs intact, alert and oriented x4, respirations even and unlabored. Discharge instructions reviewed and all questions answered.   

## 2021-10-04 NOTE — ED Provider Notes (Signed)
[facility] EMERGENCY DEPARTMENT Provider Note   CSN: [id number] Arrival date & time: 10/04/21  1944     History Chief Complaint  Patient presents with   Chest Pain    Amy Reese is a 45 y.o. female.  She has a history of diabetes and is a smoker.  Complaining of left-sided chest pressure since this morning.  There is also a sharp component that stabs her in the chest and goes through to her back.  Causes her to feel short of breath.  She is also feeling nausea.  She denies prior history of same.  No fevers chills cough vomiting diarrhea or urinary symptoms.  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  No leg swelling or leg pain.  The history is provided by the patient.  Chest Pain Pain location:  L chest Pain quality: pressure and stabbing   Pain radiates to:  Upper back Pain severity:  Moderate Onset quality:  Gradual Duration:  1 day Timing:  Constant Progression:  Unchanged Chronicity:  New Context: at rest   Relieved by:  None tried Worsened by:  Nothing Ineffective treatments:  None tried Associated symptoms: back pain, headache, nausea and shortness of breath   Associated symptoms: no abdominal pain, no cough, no diaphoresis, no fever and no vomiting       Past Medical History:  Diagnosis Date   Anxiety    Arthritis    Bipolar disorder (HCC)    Depression    Diabetes mellitus without complication (HCC)    A1c 8.8 05/24/17   Endometriosis    Environmental allergies    seasonal and pollen allergies   Hypertension    Left knee pain    Obesity    Polycystic ovarian disease    Wears glasses     Patient Active Problem List   Diagnosis Date Noted   PTSD (post-traumatic stress disorder) 12/16/2017   Mood disorder in conditions classified elsewhere 11/28/2017   Loose body of left knee 09/23/2017   Primary osteoarthritis of left knee 09/23/2017   Diabetes (HCC) 09/23/2017   Hemorrhage of right tonsil 06/05/2017   S/P T&A (status post  tonsillectomy and adenoidectomy) 05/29/2017    Past Surgical History:  Procedure Laterality Date   ABDOMINAL HYSTERECTOMY     DILATION AND CURETTAGE OF UTERUS     KNEE ARTHROSCOPY Left 09/23/2017   Procedure: ARTHROSCOPY KNEE;  Surgeon: Jodi Geralds, MD;  Location: [facility] OR;  Service: Orthopedics;  Laterality: Left;  CHONDROPLASTY, REMOVAL OF LOOSE BODIES    TONSILLECTOMY N/A 06/05/2017   Procedure: control of oropharyngeal hemorrhage;  Surgeon: Newman Pies, MD;  Location: [facility] OR;  Service: ENT;  Laterality: N/A;   TONSILLECTOMY AND ADENOIDECTOMY N/A 05/29/2017   Procedure: TONSILLECTOMY AND ADENOIDECTOMY;  Surgeon: Newman Pies, MD;  Location: [facility] OR;  Service: ENT;  Laterality: N/A;   WISDOM TOOTH EXTRACTION       OB History     Gravida  0   Para  0   Term  0   Preterm  0   AB  0   Living  0      SAB  0   IAB  0   Ectopic  0   Multiple  0   Live Births  0           Family History  Problem Relation Age of Onset   Breast cancer Mother    COPD Mother    Throat cancer Father  Breast cancer Maternal Grandmother     Social History   Tobacco Use   Smoking status: Every Day    Packs/day: 1.00    Types: Cigarettes   Smokeless tobacco: Never   Tobacco comments:    Pt considering nicotine patch  Vaping Use   Vaping Use: Never used  Substance Use Topics   Alcohol use: No   Drug use: No    Home Medications Prior to Admission medications   Medication Sig Start Date End Date Taking? Authorizing Provider  acetaminophen (TYLENOL) 500 MG tablet Take 1 tablet (500 mg total) by mouth every 6 (six) hours as needed. 03/21/20   Fawze, Mina A, PA-C  buprenorphine-naloxone (SUBOXONE) 8-2 mg SUBL SL tablet Place 1 tablet under the tongue daily.    [provider]  clonazePAM (KLONOPIN) 0.5 MG tablet Take 1 tablet (0.5 mg total) by mouth 2 (two) times daily as needed for anxiety. Patient not taking: Reported on 03/10/2020 05/21/18   Neysa Hotter, MD  clonazePAM  (KLONOPIN) 1 MG tablet Take 1 tablet (1 mg total) by mouth daily as needed for anxiety. 12/09/18   Neysa Hotter, MD  diclofenac Sodium (VOLTAREN) 1 % GEL Apply 2 g topically 4 (four) times daily as needed (pain). 03/21/20   Fawze, Mina A, PA-C  gabapentin (NEURONTIN) 100 MG capsule Take 100 mg by mouth 3 (three) times daily.    [provider]  ibuprofen (ADVIL,MOTRIN) 800 MG tablet Take 1 tablet (800 mg total) 3 (three) times daily by mouth. 09/09/17   Triplett, Tammy, PA-C  losartan (COZAAR) 50 MG tablet Take 50 mg by mouth daily.    [provider]  Multiple Vitamin (DAILY VITAMIN PO) Take by mouth. Mega Red    [provider]    Allergies    Bee venom  Review of Systems   Review of Systems  Constitutional:  Negative for diaphoresis and fever.  HENT:  Negative for sore throat.   Eyes:  Negative for visual disturbance.  Respiratory:  Positive for shortness of breath. Negative for cough.   Cardiovascular:  Positive for chest pain.  Gastrointestinal:  Positive for nausea. Negative for abdominal pain and vomiting.  Genitourinary:  Negative for dysuria.  Musculoskeletal:  Positive for back pain.  Skin:  Negative for rash.  Neurological:  Positive for headaches.   Physical Exam Updated Vital Signs BP (!) 150/89   Pulse 87   Temp 98 F (36.7 C) (Oral)   Resp (!) 25   Ht 5\' 6"  (1.676 m)   Wt (!) 142.9 kg   SpO2 99%   BMI 50.84 kg/m   Physical Exam Vitals and nursing note reviewed.  Constitutional:      General: She is not in acute distress.    Appearance: Normal appearance. She is well-developed.  HENT:     Head: Normocephalic and atraumatic.  Eyes:     Conjunctiva/sclera: Conjunctivae normal.  Cardiovascular:     Rate and Rhythm: Normal rate and regular rhythm.     Heart sounds: No murmur heard. Pulmonary:     Effort: Pulmonary effort is normal. No respiratory distress.     Breath sounds: Normal breath sounds.  Abdominal:     Palpations:  Abdomen is soft.     Tenderness: There is no abdominal tenderness. There is no guarding or rebound.  Musculoskeletal:        General: No swelling. Normal range of motion.     Cervical back: Neck supple.     Right  lower leg: No tenderness. No edema.     Left lower leg: No tenderness. No edema.  Skin:    General: Skin is warm and dry.     Capillary Refill: Capillary refill takes less than 2 seconds.  Neurological:     General: No focal deficit present.     Mental Status: She is alert.  Psychiatric:        Mood and Affect: Mood normal.    ED Results / Procedures / Treatments   Labs (all labs ordered are listed, but only abnormal results are displayed) Labs Reviewed  BASIC METABOLIC PANEL - Abnormal; Notable for the following components:      Result Value   Glucose, Bld 114 (*)    Calcium 8.8 (*)    All other components within normal limits  CBC - Abnormal; Notable for the following components:   Hemoglobin 15.3 (*)    All other components within normal limits  RESP PANEL BY RT-PCR (FLU A&B, COVID) ARPGX2  D-DIMER, QUANTITATIVE (NOT AT [facility])  TROPONIN I (HIGH SENSITIVITY)    EKG EKG Interpretation  Date/Time:  Wednesday October 04 2021 20:01:05 EST Ventricular Rate:  88 PR Interval:  188 QRS Duration: 92 QT Interval:  354 QTC Calculation: 428 R Axis:   55 Text Interpretation: Normal sinus rhythm Normal ECG No significant change since prior 7/18 Confirmed by Meridee Score [phone]) on 10/04/2021 8:13:09 PM  Radiology DG Chest 2 View  Result Date: 10/04/2021 CLINICAL DATA:  Shortness of breath and chest pain EXAM: CHEST - 2 VIEW COMPARISON:  06/03/2017 FINDINGS: Mild central airways thickening. No focal opacity, pleural effusion or pneumothorax. Normal cardiomediastinal silhouette. IMPRESSION: No active cardiopulmonary disease.  Mild bronchitic changes Electronically Signed   By: Jasmine Pang M.D.   On: 10/04/2021 21:06    Procedures Procedures   Medications Ordered  in ED Medications  aspirin chewable tablet 324 mg (324 mg Oral Given 10/04/21 2043)  ketorolac (TORADOL) 30 MG/ML injection 30 mg (30 mg Intravenous Given 10/04/21 2222)    ED Course  I have reviewed the triage vital signs and the nursing notes.  Pertinent labs & imaging results that were available during my care of the patient were reviewed by me and considered in my medical decision making (see chart for details).  Clinical Course as of 10/05/21 0940  Wed Oct 04, 2021  2247 Patient states her headache is improved and her chest pain is down to about a 1.  She does not want to get a second troponin.  Recommended close follow-up with her PCP.  Return instructions discussed. [MB]    Clinical Course User Index [MB] Terrilee Files, MD   MDM Rules/Calculators/A&P                          This patient complains of chest pressure chest pain shortness of breath; this involves an extensive number of treatment Options and is a complaint that carries with it a high risk of complications and Morbidity. The differential includes ACS, pneumonia, pneumothorax, bronchitis, PE, vascular, reflux, musculoskeletal  I ordered, reviewed and interpreted labs, which included CBC with normal white count normal hemoglobin, chemistries normal, COVID and flu negative, troponin negative, D-dimer negative I ordered medication aspirin, IV Toradol with improvement in her symptoms I ordered imaging studies which included chest x-ray and I independently    visualized and interpreted imaging which showed no acute infiltrates Previous records obtained and reviewed in epic no recent  admissions  After the interventions stated above, I reevaluated the patient and found patient symptoms to be improved.  Oxygenation 97% on room air.  She does not wish to stick around for a second troponin.  Reviewed results of work-up with her.  She is comfortable plan for outpatient follow-up with her treating providers.  Return  instructions discussed   Final Clinical Impression(s) / ED Diagnoses Final diagnoses:  Nonspecific chest pain    Rx / DC Orders ED Discharge Orders     None        Terrilee Files, MD 10/05/21 [phone]

## 2021-11-29 ENCOUNTER — Encounter: Payer: Self-pay | Admitting: Gastroenterology

## 2021-12-12 ENCOUNTER — Other Ambulatory Visit (HOSPITAL_COMMUNITY): Payer: Self-pay | Admitting: Adult Health Nurse Practitioner

## 2021-12-12 DIAGNOSIS — Z1231 Encounter for screening mammogram for malignant neoplasm of breast: Secondary | ICD-10-CM

## 2022-01-24 ENCOUNTER — Ambulatory Visit (HOSPITAL_COMMUNITY): Payer: Medicare Other

## 2022-03-26 ENCOUNTER — Ambulatory Visit (INDEPENDENT_AMBULATORY_CARE_PROVIDER_SITE_OTHER): Payer: Medicare Other | Admitting: Gastroenterology

## 2022-03-26 ENCOUNTER — Encounter: Payer: Self-pay | Admitting: Gastroenterology

## 2022-03-26 ENCOUNTER — Telehealth: Payer: Self-pay

## 2022-03-26 DIAGNOSIS — K5900 Constipation, unspecified: Secondary | ICD-10-CM | POA: Diagnosis not present

## 2022-03-26 DIAGNOSIS — R198 Other specified symptoms and signs involving the digestive system and abdomen: Secondary | ICD-10-CM | POA: Diagnosis not present

## 2022-03-26 MED ORDER — PEG 3350-KCL-NA BICARB-NACL 420 G PO SOLR: 4000.0000 mL | Freq: Once | ORAL | 0 refills | Status: AC

## 2022-03-26 MED ORDER — LUBIPROSTONE 24 MCG PO CAPS: 24.0000 ug | ORAL_CAPSULE | Freq: Two times a day (BID) | ORAL | 3 refills | Status: DC

## 2022-03-26 NOTE — Progress Notes (Signed)
GI Office Note    Referring Provider: Pablo Lawrence, NP Primary Care Physician:  Pablo Lawrence, NP  Primary Gastroenterologist: Garfield Cornea, MD   Chief Complaint   Chief Complaint  Patient presents with   Constipation    States that she has pain associated with bm's. Has had severe constipation for a while now. Was told that she has endometriosis on her intestines.      History of Present Illness   Amy Reese is a 46 y.o. female presenting today at the request of Pablo Lawrence, NP worsening constipation.  Patient states she has had issues with constipation at least dating back a couple years when she developed need for pain medications.  She notes that she became dependent upon pain medication and eventually decided to come off and she is now on Suboxone with goals of tapering off for the next 6 months.  During this time she developed lots of constipation which she was treating with over-the-counter regimens.  She is having increasing difficulty finding something to work.  She has tried milk of magnesia, magnesium citrate which used to work well for her but now she only has small [place] 1 stool with this regimen.  Has to "strain and fight to have a stool".  She tried Linzess 290 mcg daily but this did not produce a stool and only made her sick.  She utilizes suppositories and enemas when needed but only gets a "little return".  At times she has had to digitally disimpact herself.  She finds herself staying nauseated frequently.  Has early satiety, only eats small portions, cannot eat as much as she used to.  Typically for breakfast has a biscuit.  Eats again around 5 PM, fruit and vegetables at this time and meat.  Does not like consuming a lot of water.  States it worsens her nausea.  Typically drinks 36 ounces of soft drinks per day.  Also consumes energy drinks.  Denies any abdominal pain.  She takes quite a bit of Bayer back and body, typically 5/day.  Each contains  500 mg of aspirin and 32.7 mg of caffeine.  Has noted nausea after taking it.    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  Has "concrete long".  On oxygen.  Quit seeing her oncologist with plans to pursue no more treatments.  She also has custody of her niece who has cerebral palsy.   Patient states she was diagnosed with endometriosis over 20 years ago.  Also with PCOS.  History of multiple ruptured ovarian cyst, severe painful periods.  She says it took 7 years to convince someone to perform hysterectomy due to her age and that she had not had children. She had complete hysterectomy about 14 years ago.   At her heaviest she weighed 383 pounds, down to 315 at the lowest in 09/2021. Now up to 334 pounds.   No prior EGD/TCS  Medications   Current Outpatient Medications  Medication Sig Dispense Refill   buprenorphine-naloxone (SUBOXONE) 8-2 mg SUBL SL tablet Place 1 tablet under the tongue daily.     diclofenac Sodium (VOLTAREN) 1 % GEL Apply 2 g topically 4 (four) times daily as needed (pain). 50 g 0   losartan (COZAAR) 50 MG tablet Take 50 mg by mouth daily.     VYVANSE 50 MG capsule Take 50 mg by mouth every morning.     No current facility-administered medications  for this visit.    Allergies   Allergies as of 03/26/2022 - Review Complete 03/26/2022  Allergen Reaction Noted   Bee venom Anaphylaxis and Swelling 11/09/2011    Past Medical History   Past Medical History:  Diagnosis Date   Anxiety    Arthritis    Bipolar disorder ([place])    Depression    Diabetes mellitus without complication ([place])    T2W 8.8 05/24/17   Endometriosis    Environmental allergies    seasonal and pollen allergies   Hypertension    Left knee pain    Morbid obesity ([place])    Obesity    Polycystic ovarian disease    Wears glasses     Past Surgical History   Past Surgical History:  Procedure Laterality Date    ABDOMINAL HYSTERECTOMY     DILATION AND CURETTAGE OF UTERUS     KNEE ARTHROSCOPY Left 09/23/2017   Procedure: ARTHROSCOPY KNEE;  Surgeon: Dorna Leitz, MD;  Location: [facility];  Service: Orthopedics;  Laterality: Left;  CHONDROPLASTY, REMOVAL OF LOOSE BODIES    TONSILLECTOMY N/A 06/05/2017   Procedure: control of oropharyngeal hemorrhage;  Surgeon: Leta Baptist, MD;  Location: [facility];  Service: ENT;  Laterality: N/A;   TONSILLECTOMY AND ADENOIDECTOMY N/A 05/29/2017   Procedure: TONSILLECTOMY AND ADENOIDECTOMY;  Surgeon: Leta Baptist, MD;  Location: [facility] OR;  Service: ENT;  Laterality: N/A;   WISDOM TOOTH EXTRACTION      Past Family History   Family History  Problem Relation Age of Onset   Breast cancer Mother    COPD Mother    Throat cancer Father    Breast cancer Maternal Grandmother     Past Social History   Social History   Socioeconomic History   Marital status: Legally Separated    Spouse name: Not on file   Number of children: Not on file   Years of education: Not on file   Highest education level: Not on file  Occupational History   Not on file  Tobacco Use   Smoking status: Every Day    Packs/day: 1.00    Types: Cigarettes   Smokeless tobacco: Never   Tobacco comments:    Pt considering nicotine patch  Vaping Use   Vaping Use: Never used  Substance and Sexual Activity   Alcohol use: No   Drug use: No   Sexual activity: Yes    Birth control/protection: Surgical  Other Topics Concern   Not on file  Social History Narrative   Not on file   Social Determinants of Health   Financial Resource Strain: Not on file  Food Insecurity: Not on file  Transportation Needs: Not on file  Physical Activity: Not on file  Stress: Not on file  Social Connections: Not on file  Intimate Partner Violence: Not on file    Review of Systems   General: Negative for anorexia, weight loss, fever, chills, fatigue, weakness. Eyes: Negative for vision changes.  ENT: Negative for hoarseness,  difficulty swallowing , nasal congestion. CV: Negative for chest pain, angina, palpitations, dyspnea on exertion, peripheral edema.  Respiratory: Negative for dyspnea at rest, dyspnea on exertion, cough, sputum, wheezing.  GI: See history of present illness. GU:  Negative for dysuria, hematuria, urinary incontinence, urinary frequency, nocturnal urination.  MS: chronic back and knee pain.  Derm: Negative for rash or itching.  Neuro: Negative for weakness, abnormal sensation, seizure, frequent headaches, memory loss,  confusion.  Psych: Negative for  suicidal ideation, hallucinations. +anxiety/depression Endo:  Negative for unusual weight change.  Heme: Negative for bruising or bleeding. Allergy: Negative for rash or hives.  Physical Exam   BP (!) 160/100 (BP Location: Right Arm, Patient Position: Sitting, Cuff Size: Large)   Pulse 81   Temp (!) 96.6 F (35.9 C) (Temporal)   Ht '5\' 6"'  (1.676 m)   Wt (!) 334 lb (151.5 kg)   SpO2 96%   BMI 53.91 kg/m    General: Well-nourished, well-developed in no acute distress.  Head: Normocephalic, atraumatic.   Eyes: Conjunctiva pink, no icterus. Mouth: Oropharyngeal mucosa moist and pink , no lesions erythema or exudate. Neck: Supple without thyromegaly, masses, or lymphadenopathy.  Lungs: Clear to auscultation bilaterally.  Heart: Regular rate and rhythm, no murmurs rubs or gallops.  Abdomen: Bowel sounds are normal, nontender, nondistended, no hepatosplenomegaly or masses,  no abdominal bruits or hernia, no rebound or guarding.  Limited by body habitus Rectal: not performed, patient declined Extremities: No lower extremity edema. No clubbing or deformities.  Neuro: Alert and oriented x 4 , grossly normal neurologically.  Skin: Warm and dry, no rash or jaundice.   Psych: Alert and cooperative, normal mood and affect.  Labs   Glucose 70 - 99 mg/dL 131 High    BUN 6 - 24 mg/dL 12   Creatinine 0.57 - 1.00 mg/dL 0.66   eGFR >59 mL/min/1.73  112   BUN/Creatinine Ratio 9 - 23 18   Sodium 134 - 144 mmol/L 140   Potassium 3.5 - 5.2 mmol/L 4.9   Chloride 96 - 106 mmol/L 100   CO2 20 - 29 mmol/L 25   CALCIUM 8.7 - 10.2 mg/dL 9.2   Total Protein 6.0 - 8.5 g/dL 7.1   Albumin, Serum 3.8 - 4.8 g/dL 4.0   Globulin, Total 1.5 - 4.5 g/dL 3.1   Albumin/Globulin Ratio 1.2 - 2.2 1.3   Total Bilirubin 0.0 - 1.2 mg/dL 0.2   Alkaline Phosphatase 44 - 121 IU/L 128 High    AST 0 - 40 IU/L 22   ALT (SGPT) 0 - 32 IU/L 16   Resulting Agency  [facility]  Narrative Performed by [facility] Performed at:  [facility]  [address] [place], [place]  [id number]  Lab Director: Rush Farmer MD, Phone:  [phone] Specimen Collected: 11/17/21 08:12 Hemoglobin A1c 4.8 - 5.6 % 7.1 Abnormal    Resulting Agency  [facility]  Specimen Collected: 11/17/21 08:44 Last Resulted: 11/17/21 08:44     Ref Range & Units 4 mo ago  WBC 3.4 - 10.8 x10E3/uL 6.8   RBC 3.77 - 5.28 x10E6/uL 5.23   Hemoglobin 11.1 - 15.9 g/dL 16.4 High    Hematocrit 34.0 - 46.6 % 46.7 High    MCV 79 - 97 fL 89   MCH 26.6 - 33.0 pg 31.4   MCHC 31.5 - 35.7 g/dL 35.1   RDW 11.7 - 15.4 % 12.9   Platelet Count 150 - 450 x10E3/uL 214   Neutrophils Not Estab. % 57   Lymphs Relative  Not Estab. % 31   Monocytes Not Estab. % 10   Eos Relative  Not Estab. % 2   Basos Relative  Not Estab. % 0   Neutrophils Absolute 1.4 - 7.0 x10E3/uL 3.8   Lymphocytes Absolute 0.7 - 3.1 x10E3/uL 2.1   Monocytes Absolute 0.1 - 0.9 x10E3/uL 0.7   Eosinophils Absolute 0.0 - 0.4 x10E3/uL 0.1   Basophils Absolute 0.0 - 0.2 x10E3/uL 0.0   Immature Granulocytes  Not Estab. % 0   Immature Grans (Abs) 0.0 - 0.1 x10E3/uL 0.0   Resulting Agency  [facility]  Narrative Performed by [facility] Performed at:  [facility]  [address], [place], [place]  [id number]  Lab Director: Rush Farmer MD, Phone:  [phone] Specimen Collected: 11/17/21 08:12    Imaging Studies    No results found.  Assessment   Constipation: Chronic worsening constipation, difficult to manage.  Initially noted with opioid use a couple years ago.  Currently on Suboxone.  Over-the-counter regimens such as milk of magnesia, magnesium citrate, suppositories/enemas had been inadequate.  She tried Linzess 290 mcg daily, failed to respond with no noted improvement in constipation.  I suspect she has opioid-induced constipation on top of underlying chronic constipation.  She does not consume enough fluids throughout the day, predominantly only taking in caffeinated products.  Needs to increase fiber intake.  We will give her colon purge with Trylitely, then start Amitiza 24 mcg twice daily with food, she does not recall taking this previously.  She continues to block suppository or Fleet enema if she feels like she needs additional assistance to get bowels moving.  Would recommend colonoscopy once her constipation is adequately managed.  Nausea: Patient notes increased symptoms with aspirin use.  She is taking aspirin 500 mg 5 times daily for back and joint pain.  Denies abdominal pain.  No reflux type symptoms.  She has some diminished appetite but this could be secondary to Vyvanse.  We discussed risk of significant aspirin use i.e. gastritis/peptic ulcer disease. Would encourage decreasing aspirin use.    PLAN   Colon purge with Trylite.  Start Amitiza 60mg bid with food. Increase fluids that don't have caffeine.  Increase fiber in diet. Discussed and handout provided.  She will let me know if bowel regimen is not effective and needs adjusting. Once her bowels are moving we will get her scheduled for a colonoscopy for change in bowels. Consider upper endoscopy at that time due to early satiety and nausea in setting of significant ASA use.    LLaureen Ochs [facility]

## 2022-03-26 NOTE — Patient Instructions (Addendum)
For minipurge, I am going to give you a bowel prep to get your bowels moving.  You can consume the whole prep or STOP once you feel like you have had good results. Try drinking 8 ounces every 15-20 minutes over four hours. If you feel full or nauseated, you can stop for a few minutes until symptoms improve. Have a enema on hand in case you feel like you need extra help to empty your rectum. Daily you will take Amitiza 22mcg twice daily with food or a snack. I can't tell if this is covered by your insurance well until we send in the prescription. Call if any problems and we will look for an alternative.  Please reach out to me by phone or Mychart and let me know how bowel regimen is doing. We can make adjustments as needed. Once we get your bowels moving we can discuss next step, colonoscopy. May consider upper endoscopy at the same time due to your nausea issues.  Also to increase your decaf fluids. Add fruits and vegetables.   High-Fiber Eating Plan Fiber, also called dietary fiber, is a type of carbohydrate. It is found foods such as fruits, vegetables, whole grains, and beans. A high-fiber diet can have many health benefits. Your health care provider may recommend a high-fiber diet to help: Prevent constipation. Fiber can make your bowel movements more regular. Lower your cholesterol. Relieve the following conditions: Inflammation of veins in the anus (hemorrhoids). Inflammation of specific areas of the digestive tract (uncomplicated diverticulosis). A problem of the large intestine, also called the colon, that sometimes causes pain and diarrhea (irritable bowel syndrome, or IBS). Prevent overeating as part of a weight-loss plan. Prevent heart disease, type 2 diabetes, and certain cancers. What are tips for following this plan? Reading food labels  Check the nutrition facts label on food products for the amount of dietary fiber. Choose foods that have 5 grams of fiber or more per serving. The  goals for recommended daily fiber intake include: Men (age 47 or younger): 34-38 g. Men (over age 66): 28-34 g. Women (age 65 or younger): 25-28 g. Women (over age 41): 22-25 g. Your daily fiber goal is _____________ g. Shopping Choose whole fruits and vegetables instead of processed forms, such as apple juice or applesauce. Choose a wide variety of high-fiber foods such as avocados, lentils, oats, and kidney beans. Read the nutrition facts label of the foods you choose. Be aware of foods with added fiber. These foods often have high sugar and sodium amounts per serving. Cooking Use whole-grain flour for baking and cooking. Cook with brown rice instead of white rice. Meal planning Start the day with a breakfast that is high in fiber, such as a cereal that contains 5 g of fiber or more per serving. Eat breads and cereals that are made with whole-grain flour instead of refined flour or white flour. Eat brown rice, bulgur wheat, or millet instead of white rice. Use beans in place of meat in soups, salads, and pasta dishes. Be sure that half of the grains you eat each day are whole grains. General information You can get the recommended daily intake of dietary fiber by: Eating a variety of fruits, vegetables, grains, nuts, and beans. Taking a fiber supplement if you are not able to take in enough fiber in your diet. It is better to get fiber through food than from a supplement. Gradually increase how much fiber you consume. If you increase your intake of dietary fiber  too quickly, you may have bloating, cramping, or gas. Drink plenty of water to help you digest fiber. Choose high-fiber snacks, such as berries, raw vegetables, nuts, and popcorn. What foods should I eat? Fruits Berries. Pears. Apples. Oranges. Avocado. Prunes and raisins. Dried figs. Vegetables Sweet potatoes. Spinach. Kale. Artichokes. Cabbage. Broccoli. Cauliflower. Green peas. Carrots. Squash. Grains Whole-grain breads.  Multigrain cereal. Oats and oatmeal. Brown rice. Barley. Bulgur wheat. Victor. Quinoa. Bran muffins. Popcorn. Rye wafer crackers. Meats and other proteins Navy beans, kidney beans, and pinto beans. Soybeans. Split peas. Lentils. Nuts and seeds. Dairy Fiber-fortified yogurt. Beverages Fiber-fortified soy milk. Fiber-fortified orange juice. Other foods Fiber bars. The items listed above may not be a complete list of recommended foods and beverages. Contact a dietitian for more information. What foods should I avoid? Fruits Fruit juice. Cooked, strained fruit. Vegetables Fried potatoes. Canned vegetables. Well-cooked vegetables. Grains White bread. Pasta made with refined flour. White rice. Meats and other proteins Fatty cuts of meat. Fried chicken or fried fish. Dairy Milk. Yogurt. Cream cheese. Sour cream. Fats and oils Butters. Beverages Soft drinks. Other foods Cakes and pastries. The items listed above may not be a complete list of foods and beverages to avoid. Talk with your dietitian about what choices are best for you. Summary Fiber is a type of carbohydrate. It is found in foods such as fruits, vegetables, whole grains, and beans. A high-fiber diet has many benefits. It can help to prevent constipation, lower blood cholesterol, aid weight loss, and reduce your risk of heart disease, diabetes, and certain cancers. Increase your intake of fiber gradually. Increasing fiber too quickly may cause cramping, bloating, and gas. Drink plenty of water while you increase the amount of fiber you consume. The best sources of fiber include whole fruits and vegetables, whole grains, nuts, seeds, and beans. This information is not intended to replace advice given to you by your health care provider. Make sure you discuss any questions you have with your health care provider. Document Revised: 02/25/2020 Document Reviewed: 02/25/2020 Elsevier Patient Education  [place].

## 2022-03-26 NOTE — Telephone Encounter (Signed)
I submitted the PA for Lubiprostone even though it wasn't on patient's formulary and it was approved. The approval letter will be scanned in to patients chart. It was approved from 11/05/21 through 03/26/2023.

## 2022-03-26 NOTE — Telephone Encounter (Signed)
She did fail Linzess. If generic Valinda Hoar is expensive we can try something different.

## 2022-03-26 NOTE — Telephone Encounter (Signed)
Lubiprostone is not covered under the pt's insurance. They are requiring her to try and fail Linzess first.

## 2022-03-26 NOTE — Telephone Encounter (Signed)
Pt was made aware.  

## 2022-07-29 ENCOUNTER — Telehealth: Payer: Self-pay | Admitting: Gastroenterology

## 2022-07-29 NOTE — Telephone Encounter (Signed)
Recommend ov for follow up constipation and consider colonoscopy. Last seen in 03/2022 and had planned procedure once constipation addressed.  Please arrange ov.

## 2022-08-21 NOTE — Progress Notes (Deleted)
GI Office Note    Referring Provider: Pablo Lawrence, NP Primary Care Physician:  Pablo Lawrence, NP  Primary Gastroenterologist: Garfield Cornea, MD   Chief Complaint   No chief complaint on file.   History of Present Illness   Amy Reese is a 46 y.o. female presenting today for follow up of constipation and to schedule colonoscopy. Last seen in 03/2022.   Tried and failed MOM, magnesium citrate, Linzess 216mcg, enemas/suppositories inadequate.  After last visit we gave her trilyte purge and amitiza 31mcg bid. Plans to pursue a colonoscopy once constipation better managed.         Medications   Current Outpatient Medications  Medication Sig Dispense Refill   buprenorphine-naloxone (SUBOXONE) 8-2 mg SUBL SL tablet Place 1 tablet under the tongue daily.     diclofenac Sodium (VOLTAREN) 1 % GEL Apply 2 g topically 4 (four) times daily as needed (pain). 50 g 0   losartan (COZAAR) 50 MG tablet Take 50 mg by mouth daily.     lubiprostone (AMITIZA) 24 MCG capsule Take 1 capsule (24 mcg total) by mouth 2 (two) times daily with a meal. 60 capsule 3   VYVANSE 50 MG capsule Take 50 mg by mouth every morning.     No current facility-administered medications for this visit.    Allergies   Allergies as of 08/22/2022 - Review Complete 03/26/2022  Allergen Reaction Noted   Bee venom Anaphylaxis and Swelling 11/09/2011     Past Medical History   Past Medical History:  Diagnosis Date   Anxiety    Arthritis    Bipolar disorder ([place])    Depression    Diabetes mellitus without complication ([place])    A8T 8.8 05/24/17   Endometriosis    Environmental allergies    seasonal and pollen allergies   Hypertension    Left knee pain    Morbid obesity ([place])    Obesity    Polycystic ovarian disease    Wears glasses     Past Surgical History   Past Surgical History:  Procedure Laterality Date   ABDOMINAL HYSTERECTOMY     DILATION AND CURETTAGE OF UTERUS     KNEE  ARTHROSCOPY Left 09/23/2017   Procedure: ARTHROSCOPY KNEE;  Surgeon: Dorna Leitz, MD;  Location: [facility];  Service: Orthopedics;  Laterality: Left;  CHONDROPLASTY, REMOVAL OF LOOSE BODIES    TONSILLECTOMY N/A 06/05/2017   Procedure: control of oropharyngeal hemorrhage;  Surgeon: Leta Baptist, MD;  Location: [facility];  Service: ENT;  Laterality: N/A;   TONSILLECTOMY AND ADENOIDECTOMY N/A 05/29/2017   Procedure: TONSILLECTOMY AND ADENOIDECTOMY;  Surgeon: Leta Baptist, MD;  Location: [facility];  Service: ENT;  Laterality: N/A;   WISDOM TOOTH EXTRACTION      Past Family History   Family History  Problem Relation Age of Onset   Breast cancer Mother    COPD Mother    Cervical cancer Mother    Throat cancer Father    Lung cancer Father    Breast cancer Maternal Grandmother    Colon cancer Neg Hx     Past Social History   Social History   Socioeconomic History   Marital status: Legally Separated    Spouse name: Not on file   Number of children: Not on file   Years of education: Not on file   Highest education level: Not on file  Occupational History   Not on file  Tobacco Use   Smoking status: Every Day  Packs/day: 1.00    Types: Cigarettes   Smokeless tobacco: Never   Tobacco comments:    Pt considering nicotine patch  Vaping Use   Vaping Use: Never used  Substance and Sexual Activity   Alcohol use: No   Drug use: No   Sexual activity: Yes    Birth control/protection: Surgical  Other Topics Concern   Not on file  Social History Narrative   Not on file   Social Determinants of Health   Financial Resource Strain: Not on file  Food Insecurity: Not on file  Transportation Needs: Not on file  Physical Activity: Not on file  Stress: Not on file  Social Connections: Not on file  Intimate Partner Violence: Not on file    Review of Systems   General: Negative for anorexia, weight loss, fever, chills, fatigue, weakness. ENT: Negative for hoarseness, difficulty swallowing , nasal  congestion. CV: Negative for chest pain, angina, palpitations, dyspnea on exertion, peripheral edema.  Respiratory: Negative for dyspnea at rest, dyspnea on exertion, cough, sputum, wheezing.  GI: See history of present illness. GU:  Negative for dysuria, hematuria, urinary incontinence, urinary frequency, nocturnal urination.  Endo: Negative for unusual weight change.     Physical Exam   There were no vitals taken for this visit.   General: Well-nourished, well-developed in no acute distress.  Eyes: No icterus. Mouth: Oropharyngeal mucosa moist and pink , no lesions erythema or exudate. Lungs: Clear to auscultation bilaterally.  Heart: Regular rate and rhythm, no murmurs rubs or gallops.  Abdomen: Bowel sounds are normal, nontender, nondistended, no hepatosplenomegaly or masses,  no abdominal bruits or hernia , no rebound or guarding.  Rectal: ***  Extremities: No lower extremity edema. No clubbing or deformities. Neuro: Alert and oriented x 4   Skin: Warm and dry, no jaundice.   Psych: Alert and cooperative, normal mood and affect.  Labs   *** Imaging Studies   No results found.  Assessment       PLAN   ***   Leanna Battles. Melvyn Neth, MHS, PA-C [facility]

## 2022-08-22 ENCOUNTER — Ambulatory Visit: Payer: Medicare Other | Admitting: Gastroenterology

## 2022-11-07 ENCOUNTER — Other Ambulatory Visit (HOSPITAL_COMMUNITY): Payer: Self-pay | Admitting: Adult Health Nurse Practitioner

## 2022-11-07 DIAGNOSIS — K5904 Chronic idiopathic constipation: Secondary | ICD-10-CM

## 2022-11-08 ENCOUNTER — Ambulatory Visit (HOSPITAL_COMMUNITY)
Admission: RE | Admit: 2022-11-08 | Discharge: 2022-11-08 | Disposition: A | Discharge status: Home / Self Care | Payer: Medicare Other | Source: Ambulatory Visit | Attending: Adult Health Nurse Practitioner | Admitting: Adult Health Nurse Practitioner

## 2022-11-08 DIAGNOSIS — K5904 Chronic idiopathic constipation: Secondary | ICD-10-CM | POA: Insufficient documentation

## 2024-05-08 ENCOUNTER — Other Ambulatory Visit: Payer: Self-pay

## 2024-05-08 ENCOUNTER — Emergency Department (HOSPITAL_COMMUNITY)
Admission: EM | Admit: 2024-05-08 | Discharge: 2024-05-08 | Disposition: A | Discharge status: Home / Self Care | Attending: Emergency Medicine | Admitting: Emergency Medicine

## 2024-05-08 ENCOUNTER — Encounter (HOSPITAL_COMMUNITY): Payer: Self-pay

## 2024-05-08 ENCOUNTER — Emergency Department (HOSPITAL_COMMUNITY)

## 2024-05-08 DIAGNOSIS — I10 Essential (primary) hypertension: Secondary | ICD-10-CM | POA: Insufficient documentation

## 2024-05-08 DIAGNOSIS — R519 Headache, unspecified: Secondary | ICD-10-CM | POA: Diagnosis present

## 2024-05-08 DIAGNOSIS — F17210 Nicotine dependence, cigarettes, uncomplicated: Secondary | ICD-10-CM | POA: Diagnosis not present

## 2024-05-08 DIAGNOSIS — K047 Periapical abscess without sinus: Secondary | ICD-10-CM | POA: Diagnosis not present

## 2024-05-08 DIAGNOSIS — E119 Type 2 diabetes mellitus without complications: Secondary | ICD-10-CM | POA: Diagnosis not present

## 2024-05-08 LAB — CBC WITH DIFFERENTIAL/PLATELET
Abs Immature Granulocytes: 0.02 K/uL (ref 0.00–0.07)
Basophils Absolute: 0 K/uL (ref 0.0–0.1)
Basophils Relative: 0 %
Eosinophils Absolute: 0.1 K/uL (ref 0.0–0.5)
Eosinophils Relative: 1 %
HCT: 45.2 % (ref 36.0–46.0)
Hemoglobin: 14.7 g/dL (ref 12.0–15.0)
Immature Granulocytes: 0 %
Lymphocytes Relative: 23 %
Lymphs Abs: 1.8 K/uL (ref 0.7–4.0)
MCH: 30.7 pg (ref 26.0–34.0)
MCHC: 32.5 g/dL (ref 30.0–36.0)
MCV: 94.4 fL (ref 80.0–100.0)
Monocytes Absolute: 0.8 K/uL (ref 0.1–1.0)
Monocytes Relative: 10 %
Neutro Abs: 5.2 K/uL (ref 1.7–7.7)
Neutrophils Relative %: 66 %
Platelets: 210 K/uL (ref 150–400)
RBC: 4.79 MIL/uL (ref 3.87–5.11)
RDW: 13.6 % (ref 11.5–15.5)
WBC: 7.9 K/uL (ref 4.0–10.5)
nRBC: 0 % (ref 0.0–0.2)

## 2024-05-08 LAB — COMPREHENSIVE METABOLIC PANEL WITH GFR
ALT: 23 U/L (ref 0–44)
AST: 22 U/L (ref 15–41)
Albumin: 3.5 g/dL (ref 3.5–5.0)
Alkaline Phosphatase: 103 U/L (ref 38–126)
Anion gap: 8 (ref 5–15)
BUN: 15 mg/dL (ref 6–20)
CO2: 25 mmol/L (ref 22–32)
Calcium: 8.6 mg/dL — ABNORMAL LOW (ref 8.9–10.3)
Chloride: 99 mmol/L (ref 98–111)
Creatinine, Ser: 0.68 mg/dL (ref 0.44–1.00)
GFR, Estimated: >60 mL/min (ref >60)
Glucose, Bld: 249 mg/dL — ABNORMAL HIGH (ref 70–99)
Potassium: 4.4 mmol/L (ref 3.5–5.1)
Sodium: 132 mmol/L — ABNORMAL LOW (ref 135–145)
Total Bilirubin: 0.9 mg/dL (ref 0.0–1.2)
Total Protein: 7 g/dL (ref 6.5–8.1)

## 2024-05-08 LAB — TROPONIN I (HIGH SENSITIVITY)
Troponin I (High Sensitivity): 6 ng/L (ref <18)
Troponin I (High Sensitivity): 6 ng/L (ref <18)

## 2024-05-08 MED ORDER — KETOROLAC TROMETHAMINE 15 MG/ML IJ SOLN: 15.0000 mg | Freq: Once | INTRAMUSCULAR | Status: DC

## 2024-05-08 MED ORDER — AMOXICILLIN-POT CLAVULANATE 875-125 MG PO TABS
1.0000 | ORAL_TABLET | Freq: Two times a day (BID) | ORAL | 0 refills | Status: AC
Start: 2024-05-08 — End: ?

## 2024-05-08 MED ORDER — SODIUM CHLORIDE 0.9 % IV BOLUS
1000.0000 mL | Freq: Once | INTRAVENOUS | Status: AC
  Administered 2024-05-08: 1000 mL via INTRAVENOUS

## 2024-05-08 MED ORDER — HYDROMORPHONE HCL 1 MG/ML IJ SOLN: 0.5000 mg | Freq: Once | INTRAMUSCULAR | Status: DC

## 2024-05-08 MED ORDER — HYDROMORPHONE HCL 1 MG/ML IJ SOLN
1.0000 mg | Freq: Once | INTRAMUSCULAR | Status: DC
  Filled 2024-05-08: qty 1

## 2024-05-08 MED ORDER — IOHEXOL 300 MG/ML  SOLN
75.0000 mL | Freq: Once | INTRAMUSCULAR | Status: AC | PRN
  Administered 2024-05-08: 75 mL via INTRAVENOUS

## 2024-05-08 MED ORDER — KETOROLAC TROMETHAMINE 30 MG/ML IJ SOLN: 15.0000 mg | Freq: Once | INTRAMUSCULAR | Status: DC

## 2024-05-08 MED ORDER — ONDANSETRON HCL 4 MG/2ML IJ SOLN: 4.0000 mg | Freq: Once | INTRAMUSCULAR | Status: DC

## 2024-05-08 MED ORDER — HYDROMORPHONE HCL 1 MG/ML IJ SOLN
0.5000 mg | Freq: Once | INTRAMUSCULAR | Status: AC
  Administered 2024-05-08: 0.5 mg via INTRAVENOUS
  Filled 2024-05-08: qty 0.5

## 2024-05-08 MED ORDER — KETOROLAC TROMETHAMINE 15 MG/ML IJ SOLN
15.0000 mg | Freq: Once | INTRAMUSCULAR | Status: AC
  Administered 2024-05-08: 15 mg via INTRAMUSCULAR
  Filled 2024-05-08: qty 1

## 2024-05-08 MED ORDER — HYDROMORPHONE HCL 1 MG/ML IJ SOLN
0.5000 mg | Freq: Once | INTRAMUSCULAR | Status: DC
  Filled 2024-05-08: qty 0.5

## 2024-05-08 NOTE — ED Provider Notes (Signed)
 [place] EMERGENCY DEPARTMENT AT [facility] Provider Note  CSN: [id number] Arrival date & time: 05/08/24 1511  Chief Complaint(s) Oral Swelling  HPI Amy Reese is a 48 y.o. female history of bipolar disorder, diabetes, obesity presenting to the emergency department with facial pain.  Patient reports had a tooth extracted for dental infection about 2 weeks ago.  Was on antibiotics until 2 days ago.  Today felt that her face was swelling and mouth was swelling and had increasing pain.  She also reports some chest pain and shortness of breath.  Able to swallow.   Past Medical History Past Medical History:  Diagnosis Date   Anxiety    Arthritis    Bipolar disorder (HCC)    Depression    Diabetes mellitus without complication (HCC)    A1c 8.8 05/24/17   Endometriosis    Environmental allergies    seasonal and pollen allergies   Hypertension    Left knee pain    Morbid obesity (HCC)    Obesity    Polycystic ovarian disease    Wears glasses    Patient Active Problem List   Diagnosis Date Noted   Constipation 03/26/2022   Change in bowel function 03/26/2022   PTSD (post-traumatic stress disorder) 12/16/2017   Mood disorder in conditions classified elsewhere 11/28/2017   Loose body of left knee 09/23/2017   Primary osteoarthritis of left knee 09/23/2017   Diabetes (HCC) 09/23/2017   Hemorrhage of right tonsil 06/05/2017   S/P T&A (status post tonsillectomy and adenoidectomy) 05/29/2017   Home Medication(s) Prior to Admission medications   Medication Sig Start Date End Date Taking? Authorizing Provider  acetaminophen  (TYLENOL ) 500 MG tablet Take 1,000 mg by mouth every 6 (six) hours as needed for mild pain (pain score 1-3).   Yes [provider]  amoxicillin -clavulanate (AUGMENTIN ) 875-125 MG tablet Take 1 tablet by mouth every 12 (twelve) hours. 05/08/24  Yes Francesca Elsie CROME, MD  diclofenac  Sodium (VOLTAREN ) 1 % GEL Apply 2 g topically 4 (four) times  daily as needed (pain). Patient taking differently: Apply 2 g topically 4 (four) times daily as needed (knee pain). 03/21/20  Yes Fawze, Mina A, PA-C  DULoxetine (CYMBALTA) 20 MG capsule Take 20 mg by mouth 2 (two) times daily. 04/27/24  Yes [provider]  ibuprofen  (ADVIL ) 200 MG tablet Take 600 mg by mouth every 6 (six) hours as needed for mild pain (pain score 1-3).   Yes [provider]  lisdexamfetamine (VYVANSE) 60 MG capsule Take 60 mg by mouth every morning. 04/27/24  Yes [provider]  ZUBSOLV 5.7-1.4 MG SUBL Place 1 tablet under the tongue daily. 04/21/24  Yes [provider]                                                                                                                                    Past Surgical History Past Surgical History:  Procedure Laterality Date   ABDOMINAL HYSTERECTOMY     DILATION AND CURETTAGE OF UTERUS     KNEE ARTHROSCOPY Left 09/23/2017   Procedure: ARTHROSCOPY KNEE;  Surgeon: Yvone Rush, MD;  Location: [facility] OR;  Service: Orthopedics;  Laterality: Left;  CHONDROPLASTY, REMOVAL OF LOOSE BODIES    TONSILLECTOMY N/A 06/05/2017   Procedure: control of oropharyngeal hemorrhage;  Surgeon: Karis Clunes, MD;  Location: [facility] OR;  Service: ENT;  Laterality: N/A;   TONSILLECTOMY AND ADENOIDECTOMY N/A 05/29/2017   Procedure: TONSILLECTOMY AND ADENOIDECTOMY;  Surgeon: Karis Clunes, MD;  Location: [facility] OR;  Service: ENT;  Laterality: N/A;   WISDOM TOOTH EXTRACTION     Family History Family History  Problem Relation Age of Onset   Breast cancer Mother    COPD Mother    Cervical cancer Mother    Throat cancer Father    Lung cancer Father    Breast cancer Maternal Grandmother    Colon cancer Neg Hx     Social History Social History   Tobacco Use   Smoking status: Every Day    Current packs/day: 1.00    Types: Cigarettes   Smokeless tobacco: Never   Tobacco comments:    Pt considering nicotine patch  Vaping Use   Vaping  status: Never Used  Substance Use Topics   Alcohol use: No   Drug use: No   Allergies Bee venom  Review of Systems Review of Systems  All other systems reviewed and are negative.   Physical Exam Vital Signs  I have reviewed the triage vital signs BP (!) 165/105   Pulse 81   Temp 97.9 F (36.6 C) (Oral)   Resp 11   Ht 5' 6 (1.676 m)   Wt (!) 145.2 kg   SpO2 93%   BMI 51.65 kg/m  Physical Exam Vitals and nursing note reviewed.  Constitutional:      General: She is not in acute distress.    Appearance: She is well-developed.  HENT:     Head: Normocephalic and atraumatic.     Mouth/Throat:     Mouth: Mucous membranes are moist.     Comments: Mild pain to the upper anterior gums, no obvious periapical abscess.  Poor dentition throughout.  No tongue swelling, no uvular deviation.  No oropharyngeal swelling.  No objective external facial swelling.  Floor of mouth soft.  No stridor. Eyes:     Pupils: Pupils are equal, round, and reactive to light.  Cardiovascular:     Rate and Rhythm: Normal rate and regular rhythm.     Heart sounds: No murmur heard. Pulmonary:     Effort: Pulmonary effort is normal. No respiratory distress.     Breath sounds: Normal breath sounds.  Abdominal:     General: Abdomen is flat.     Palpations: Abdomen is soft.     Tenderness: There is no abdominal tenderness.  Musculoskeletal:        General: No tenderness.     Right lower leg: No edema.     Left lower leg: No edema.  Skin:    General: Skin is warm and dry.  Neurological:     General: No focal deficit present.     Mental Status: She is alert. Mental status is at baseline.  Psychiatric:        Mood and Affect: Mood normal.        Behavior: Behavior normal.     ED Results and Treatments Labs (all labs ordered are listed,  but only abnormal results are displayed) Labs Reviewed  COMPREHENSIVE METABOLIC PANEL WITH GFR - Abnormal; Notable for the following components:      Result  Value   Sodium 132 (*)    Glucose, Bld 249 (*)    Calcium 8.6 (*)    All other components within normal limits  CBC WITH DIFFERENTIAL/PLATELET  TROPONIN I (HIGH SENSITIVITY)  TROPONIN I (HIGH SENSITIVITY)                                                                                                                          Radiology CT Soft Tissue Neck W Contrast Result Date: 05/08/2024 CLINICAL DATA:  Initial evaluation for suspected acute soft tissue infection. EXAM: CT NECK WITH CONTRAST TECHNIQUE: Multidetector CT imaging of the neck was performed using the standard protocol following the bolus administration of intravenous contrast. RADIATION DOSE REDUCTION: This exam was performed according to the departmental dose-optimization program which includes automated exposure control, adjustment of the mA and/or kV according to patient size and/or use of iterative reconstruction technique. CONTRAST:  75mL OMNIPAQUE  IOHEXOL  300 MG/ML  SOLN COMPARISON:  None Available. FINDINGS: Pharynx and larynx: Question soft tissue swelling with hazy inflammatory stranding about the upper lip (series 2, image 23). Finding is nonspecific, but could reflect sequelae of localized infection/cellulitis. Few scattered underlying dental caries, suggesting an odontogenic origin. No discrete abscess or drainable fluid collection. Oral cavity itself within normal limits. Oropharynx and nasopharynx within normal limits. No retropharyngeal collection or swelling. Negative epiglottis. Hypopharynx and supraglottic larynx within normal limits. Negative glottis. Subglottic airway patent clear. Salivary glands: Salivary glands including the parotid and submandibular glands are within normal limits. Thyroid: Normal. Lymph nodes: Mildly prominent level 1 B lymph nodes measure up to 9 mm, nonspecific, but could be reactive. No other enlarged or pathologic lymph nodes identified. Vascular: Normal to vascular enhancement seen within the  neck. Mild atheromatous change about the carotid bifurcations. Limited intracranial: Unremarkable. Visualized orbits: Unremarkable. Mastoids and visualized paranasal sinuses: Visualized paranasal sinuses are clear. Visualized mastoids and middle ear cavities are well pneumatized and free of fluid. Skeleton: No worrisome osseous lesions. Upper chest: No other acute finding. Other: None. IMPRESSION: 1. Question soft tissue swelling with hazy inflammatory stranding about the upper lip, nonspecific, but could reflect sequelae of localized infection/cellulitis. Few scattered underlying dental caries, suggesting an odontogenic origin. No discrete abscess or drainable fluid collection. Correlation with physical exam recommended. 2. Mildly prominent level 1b lymph nodes, nonspecific, but could be reactive. 3. No other acute abnormality within the neck. Electronically Signed   By: Morene Hoard M.D.   On: 05/08/2024 18:49   DG Chest Portable 1 View Result Date: 05/08/2024 CLINICAL DATA:  Chest pain EXAM: PORTABLE CHEST 1 VIEW COMPARISON:  None Available. FINDINGS: Normal cardiac silhouette. Low lung volumes. No effusion, infiltrate or pneumothorax. No acute osseous abnormality. IMPRESSION: Low lung volumes.  No acute findings. Electronically Signed   By: Jackquline Malva HERO.D.  On: 05/08/2024 16:31    Pertinent labs & imaging results that were available during my care of the patient were reviewed by me and considered in my medical decision making (see MDM for details).  Medications Ordered in ED Medications  ondansetron  (ZOFRAN ) injection 4 mg (4 mg Intravenous Not Given 05/08/24 1641)  sodium chloride  0.9 % bolus 1,000 mL (0 mLs Intravenous Stopped 05/08/24 1858)  ketorolac  (TORADOL ) 15 MG/ML injection 15 mg (15 mg Intramuscular Given 05/08/24 1621)  HYDROmorphone  (DILAUDID ) injection 0.5 mg (0.5 mg Intravenous Given 05/08/24 1649)  iohexol  (OMNIPAQUE ) 300 MG/ML solution 75 mL (75 mLs Intravenous Contrast Given  05/08/24 1815)                                                                                                                                     Procedures Procedures  (including critical care time)  Medical Decision Making / ED Course   MDM:  48 year old presenting to the emergency department with oral pain.  Patient overall well-appearing, physical examination with some tenderness over the anterior gums without obvious periapical abscess or other dangerous signs of deep space neck infection.  Also reporting some chest pain.  Although no obvious signs of swelling, given reported difficulty breathing (although no stridor on exam) will obtain CT neck with contrast.  Patient also reports chest pain which seems to be radiating from her tooth pain, will check EKG, troponin, chest x-ray.  Will treat her symptoms.  Will reassess.  If workup is unremarkable, given symptoms have worsened after discontinuation of antibiotics may need to resume antibiotics and follow-up with her dentist/oral surgeon.    Clinical Course as of 05/08/24 1908  [place] May 08, 2024  1906 CT scan of the neck shows nonspecific inflammation around anterior teeth without abscess or drainable fluid collection.  Also scan showed some lymphadenopathy likely due to infection.  No sign of deep space infection.  Discussed results with the patient.  Recommended that she follow-up again with her dentist to perform the extraction.  Will prescribe further course of antibiotic.  Cardiac testing reassuring including EKG, chest x-ray, troponin.  Low concern for other process such as pulmonary embolism. Will discharge patient to home. All questions answered. Patient comfortable with plan of discharge. Return precautions discussed with patient and specified on the after visit summary.  [WS]    Clinical Course User Index [WS] Francesca Elsie CROME, MD     Additional history obtained: -External records from outside source obtained and  reviewed including: Chart review including previous notes, labs, imaging, consultation notes including prior PMD notes    Lab Tests: -I ordered, reviewed, and interpreted labs.   The pertinent results include:   Labs Reviewed  COMPREHENSIVE METABOLIC PANEL WITH GFR - Abnormal; Notable for the following components:      Result Value   Sodium 132 (*)    Glucose, Bld 249 (*)    Calcium 8.6 (*)  All other components within normal limits  CBC WITH DIFFERENTIAL/PLATELET  TROPONIN I (HIGH SENSITIVITY)  TROPONIN I (HIGH SENSITIVITY)    Notable for mild hyperglycemia   EKG   EKG Interpretation Date/Time:  Friday May 08 2024 15:45:30 EDT Ventricular Rate:  91 PR Interval:  179 QRS Duration:  104 QT Interval:  378 QTC Calculation: 466 R Axis:   43  Text Interpretation: Sinus rhythm Consider right atrial enlargement Confirmed by Francesca Fallow ([id number]) on 05/08/2024 3:51:48 PM         Imaging Studies ordered: I ordered imaging studies including CT neck  On my interpretation imaging demonstrates no deep space infection, mild inflammation around anterior upper gum  I independently visualized and interpreted imaging. I agree with the radiologist interpretation   Medicines ordered and prescription drug management: Meds ordered this encounter  Medications   ondansetron  (ZOFRAN ) injection 4 mg   sodium chloride  0.9 % bolus 1,000 mL   DISCONTD: ketorolac  (TORADOL ) 30 MG/ML injection 15 mg   DISCONTD: HYDROmorphone  (DILAUDID ) injection 0.5 mg   DISCONTD: ketorolac  (TORADOL ) 15 MG/ML injection 15 mg   DISCONTD: HYDROmorphone  (DILAUDID ) injection 0.5 mg   DISCONTD: HYDROmorphone  (DILAUDID ) injection 1 mg   DISCONTD: ketorolac  (TORADOL ) 15 MG/ML injection 15 mg   DISCONTD: HYDROmorphone  (DILAUDID ) injection 0.5 mg   ketorolac  (TORADOL ) 15 MG/ML injection 15 mg   HYDROmorphone  (DILAUDID ) injection 0.5 mg   iohexol  (OMNIPAQUE ) 300 MG/ML solution 75 mL   amoxicillin -clavulanate  (AUGMENTIN ) 875-125 MG tablet    Sig: Take 1 tablet by mouth every 12 (twelve) hours.    Dispense:  14 tablet    Refill:  0    -I have reviewed the patients home medicines and have made adjustments as needed   Social Determinants of Health:  Diagnosis or treatment significantly limited by social determinants of health: obesity   Reevaluation: After the interventions noted above, I reevaluated the patient and found that their symptoms have improved  Co morbidities that complicate the patient evaluation  Past Medical History:  Diagnosis Date   Anxiety    Arthritis    Bipolar disorder (HCC)    Depression    Diabetes mellitus without complication (HCC)    A1c 8.8 05/24/17   Endometriosis    Environmental allergies    seasonal and pollen allergies   Hypertension    Left knee pain    Morbid obesity (HCC)    Obesity    Polycystic ovarian disease    Wears glasses       Dispostion: Disposition decision including need for hospitalization was considered, and patient discharged from emergency department.    Final Clinical Impression(s) / ED Diagnoses Final diagnoses:  Dental infection     This chart was dictated using voice recognition software.  Despite best efforts to proofread,  errors can occur which can change the documentation meaning.    Francesca Fallow CROME, MD 05/08/24 TYRA

## 2024-05-08 NOTE — ED Notes (Signed)
 Francesca, MD notified of BP>180

## 2024-05-08 NOTE — ED Triage Notes (Signed)
 Patient complains of oral swelling and difficulty breathing. Patient had oral surgery on a tooth a week ago.

## 2024-05-08 NOTE — Discharge Instructions (Addendum)
 We evaluated you for your swelling and pain.  Your testing in the emergency department is reassuring.  The CT scan did show some inflammation in your gum around where your tooth extraction was, but there was no sign of an abscess or cyst that we would be able to drain.  Please follow-up with your dentist for recheck.  We have prescribed you another course of antibiotics.   We also evaluated your chest pain and shortness of breath.  Your chest x-ray was clear and your cardiac enzyme testing and ECG were reassuring.  Please return for any new or worsening symptoms.
